# Patient Record
Sex: Male | Born: 1990 | Race: Black or African American | Hispanic: No | Marital: Single | Smoking: Former smoker
Health system: Southern US, Community
[De-identification: ages and names within clinical notes are randomized; demographics above are authoritative.]

## PROBLEM LIST (undated history)

## (undated) ENCOUNTER — Ambulatory Visit: Payer: Medicaid Other | Source: Home / Self Care

## (undated) DIAGNOSIS — E119 Type 2 diabetes mellitus without complications: Secondary | ICD-10-CM

## (undated) DIAGNOSIS — D649 Anemia, unspecified: Secondary | ICD-10-CM

## (undated) DIAGNOSIS — I10 Essential (primary) hypertension: Secondary | ICD-10-CM

## (undated) HISTORY — DX: Anemia, unspecified: D64.9

---
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
## 2012-02-23 ENCOUNTER — Emergency Department (HOSPITAL_COMMUNITY)
Admission: EM | Admit: 2012-02-23 | Discharge: 2012-02-23 | Disposition: A | Discharge status: Home / Self Care | Payer: Self-pay | Attending: Emergency Medicine | Admitting: Emergency Medicine

## 2012-02-23 ENCOUNTER — Emergency Department (HOSPITAL_COMMUNITY): Payer: Self-pay

## 2012-02-23 ENCOUNTER — Encounter (HOSPITAL_COMMUNITY): Payer: Self-pay | Admitting: Emergency Medicine

## 2012-02-23 DIAGNOSIS — R29898 Other symptoms and signs involving the musculoskeletal system: Secondary | ICD-10-CM

## 2012-02-23 DIAGNOSIS — M2550 Pain in unspecified joint: Secondary | ICD-10-CM | POA: Insufficient documentation

## 2012-02-23 DIAGNOSIS — S6390XA Sprain of unspecified part of unspecified wrist and hand, initial encounter: Secondary | ICD-10-CM | POA: Insufficient documentation

## 2012-02-23 DIAGNOSIS — F17200 Nicotine dependence, unspecified, uncomplicated: Secondary | ICD-10-CM | POA: Insufficient documentation
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
## 2012-02-24 NOTE — ED Provider Notes (Signed)
Medical screening examination/treatment/procedure(s) were performed by non-physician practitioner and as supervising physician I was immediately available for consultation/collaboration.   Glynn Octave, MD 02/24/12 [phone]

## 2012-06-23 ENCOUNTER — Ambulatory Visit (INDEPENDENT_AMBULATORY_CARE_PROVIDER_SITE_OTHER): Payer: BC Managed Care – PPO | Admitting: Family Medicine

## 2012-06-23 ENCOUNTER — Ambulatory Visit: Payer: BC Managed Care – PPO

## 2012-06-23 VITALS — BP 99/62 | HR 96 | Temp 97.9°F | Resp 18 | Ht 69.0 in | Wt 265.0 lb

## 2012-06-23 DIAGNOSIS — M25529 Pain in unspecified elbow: Secondary | ICD-10-CM

## 2012-06-23 DIAGNOSIS — M25522 Pain in left elbow: Secondary | ICD-10-CM
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
## 2012-06-23 NOTE — Patient Instructions (Signed)
Bone Bruise  A bone bruise is a small hidden fracture of the bone. It typically occurs with bones located close to the surface of the skin.  SYMPTOMS  The pain lasts longer than a normal bruise.  The bruised area is difficult to use.  There may be discoloration or swelling of the bruised area.  When a bone bruise is found with injury to the anterior cruciate ligament (in the knee) there is often an increased:  Amount of fluid in the knee  Time the fluid in the knee lasts.  Number of days until you are walking normally and regaining the motion you had before the injury.  Number of days with pain from the injury. DIAGNOSIS  It can only be seen on X-rays known as MRIs. This stands for magnetic resonance imaging. A regular X-ray taken of a bone bruise would appear to be normal. A bone bruise is a common injury in the knee and the heel bone (calcaneus). The problems are similar to those produced by stress fractures, which are bone injuries caused by overuse. A bone bruise may also be a sign of other injuries. For example, bone bruises are commonly found where an anterior cruciate ligament (ACL) in the knee has been pulled away from the bone (ruptured). A ligament is a tough fibrous material that connects bones together to make our joints stable. Bruises of the bone last a lot longer than bruises of the muscle or tissues beneath the skin. Bone bruises can last from days to months and are often more severe and painful than other bruises. TREATMENT Because bone bruises are sudden injuries you cannot often prevent them, other than by being extremely careful. Some things you can do to improve the condition are:  Apply ice to the sore area for 15 to 20 minutes, 3 to 4 times per day while awake for the first 2 days. Put the ice in a plastic bag, and place a towel between the bag of ice and your skin.  Keep your bruised area raised (elevated) when possible to lessen swelling.  For activity:  Use  crutches when necessary; do not put weight on the injured leg until you are no longer tender.  You may walk on your affected part as the pain allows, or as instructed.  Start weight bearing gradually on the bruised part.  Continue to use crutches or a cane until you can stand without causing pain, or as instructed.  If a plaster splint was applied, wear the splint until you are seen for a follow-up examination. Rest it on nothing harder than a pillow the first 24 hours. Do not put weight on it. Do not get it wet. You may take it off to take a shower or bath.  If an air splint was applied, more air may be blown into or out of the splint as needed for comfort. You may take it off at night and to take a shower or bath.  Wiggle your toes in the splint several times per day if you are able.  You may have been given an elastic bandage to use with the plaster splint or alone. The splint is too tight if you have numbness, tingling or if your foot becomes cold and blue. Adjust the bandage to make it comfortable.  Only take over-the-counter or prescription medicines for pain, discomfort, or fever as directed by your caregiver.  Follow all instructions for follow up with your caregiver. This includes any orthopedic referrals, physical therapy, and  for pain, discomfort, or fever as directed by your caregiver.   Follow all instructions for follow up with your caregiver. This includes any orthopedic referrals, physical therapy, and rehabilitation. Any delay in obtaining necessary care could result in a delay or failure of the bones to heal.  SEEK MEDICAL CARE IF:   You have an increase in bruising, swelling, or pain.   You notice coldness of your toes.   You do not get pain relief with medications.  SEEK IMMEDIATE MEDICAL CARE IF:   Your toes are numb or blue.   You have severe pain not controlled with medications.   If any of the problems that caused you to seek care are becoming worse.  Document Released: 05/10/2003 Document Revised: 05/12/2011 Document Reviewed: 09/22/2007   [employer] Patient Information 2013 [employer].

## 2012-06-24 ENCOUNTER — Encounter: Payer: Self-pay | Admitting: Family Medicine

## 2013-11-10 ENCOUNTER — Emergency Department (HOSPITAL_COMMUNITY)
Admission: EM | Admit: 2013-11-10 | Discharge: 2013-11-10 | Disposition: A | Discharge status: Home / Self Care | Payer: No Typology Code available for payment source | Attending: Emergency Medicine | Admitting: Emergency Medicine

## 2013-11-10 ENCOUNTER — Encounter (HOSPITAL_COMMUNITY): Payer: Self-pay | Admitting: Emergency Medicine

## 2013-11-10 DIAGNOSIS — Y9389 Activity, other specified: Secondary | ICD-10-CM | POA: Diagnosis not present

## 2013-11-10 DIAGNOSIS — Y92410 Unspecified street and highway as the place of occurrence of the external cause: Secondary | ICD-10-CM | POA: Insufficient documentation

## 2013-11-10 DIAGNOSIS — Z792 Long term (current) use of antibiotics: Secondary | ICD-10-CM | POA: Insufficient documentation

## 2013-11-10 DIAGNOSIS — F17200 Nicotine dependence, unspecified, uncomplicated: Secondary | ICD-10-CM | POA: Insufficient documentation

## 2013-11-10 DIAGNOSIS — IMO0002 Reserved for concepts with insufficient information to code with codable children: Secondary | ICD-10-CM | POA: Insufficient documentation

## 2013-11-10 DIAGNOSIS — Z872 Personal history of diseases of the skin and subcutaneous tissue: Secondary | ICD-10-CM | POA: Diagnosis not present

## 2013-11-10 MED ORDER — OXYCODONE-ACETAMINOPHEN 5-325 MG PO TABS
1.0000 | ORAL_TABLET | Freq: Once | ORAL | Status: AC
  Administered 2013-11-10: 1 via ORAL
  Filled 2013-11-10: qty 1

## 2013-11-10 MED ORDER — IBUPROFEN 400 MG PO TABS
600.0000 mg | ORAL_TABLET | Freq: Once | ORAL | Status: AC
  Administered 2013-11-10: 600 mg via ORAL
  Filled 2013-11-10 (×2): qty 1

## 2013-11-10 NOTE — ED Notes (Addendum)
Pt reports midline back pain and tenderness; reports thinks he is diabetic and has boils under arms. C collar in place; pt log rolled and back board removed. PT resting without distress.
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
## 2013-11-10 NOTE — ED Notes (Signed)
MD at bedside. 

## 2013-11-10 NOTE — ED Notes (Signed)
PT ambulated with baseline gait; VSS; A&Ox3; no signs of distress; respirations even and unlabored; skin warm and dry; no questions upon discharge.  

## 2013-11-10 NOTE — Discharge Instructions (Signed)
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
## 2013-12-27 IMAGING — CR DG HUMERUS 2V *L*
2 series · 2 of 2 positions shown · non-contrast
Comparison: None.

CLINICAL DATA: 21-year-old male with pain.

LEFT HUMERUS - 2+ VIEW

[lateral (1 of 2)]
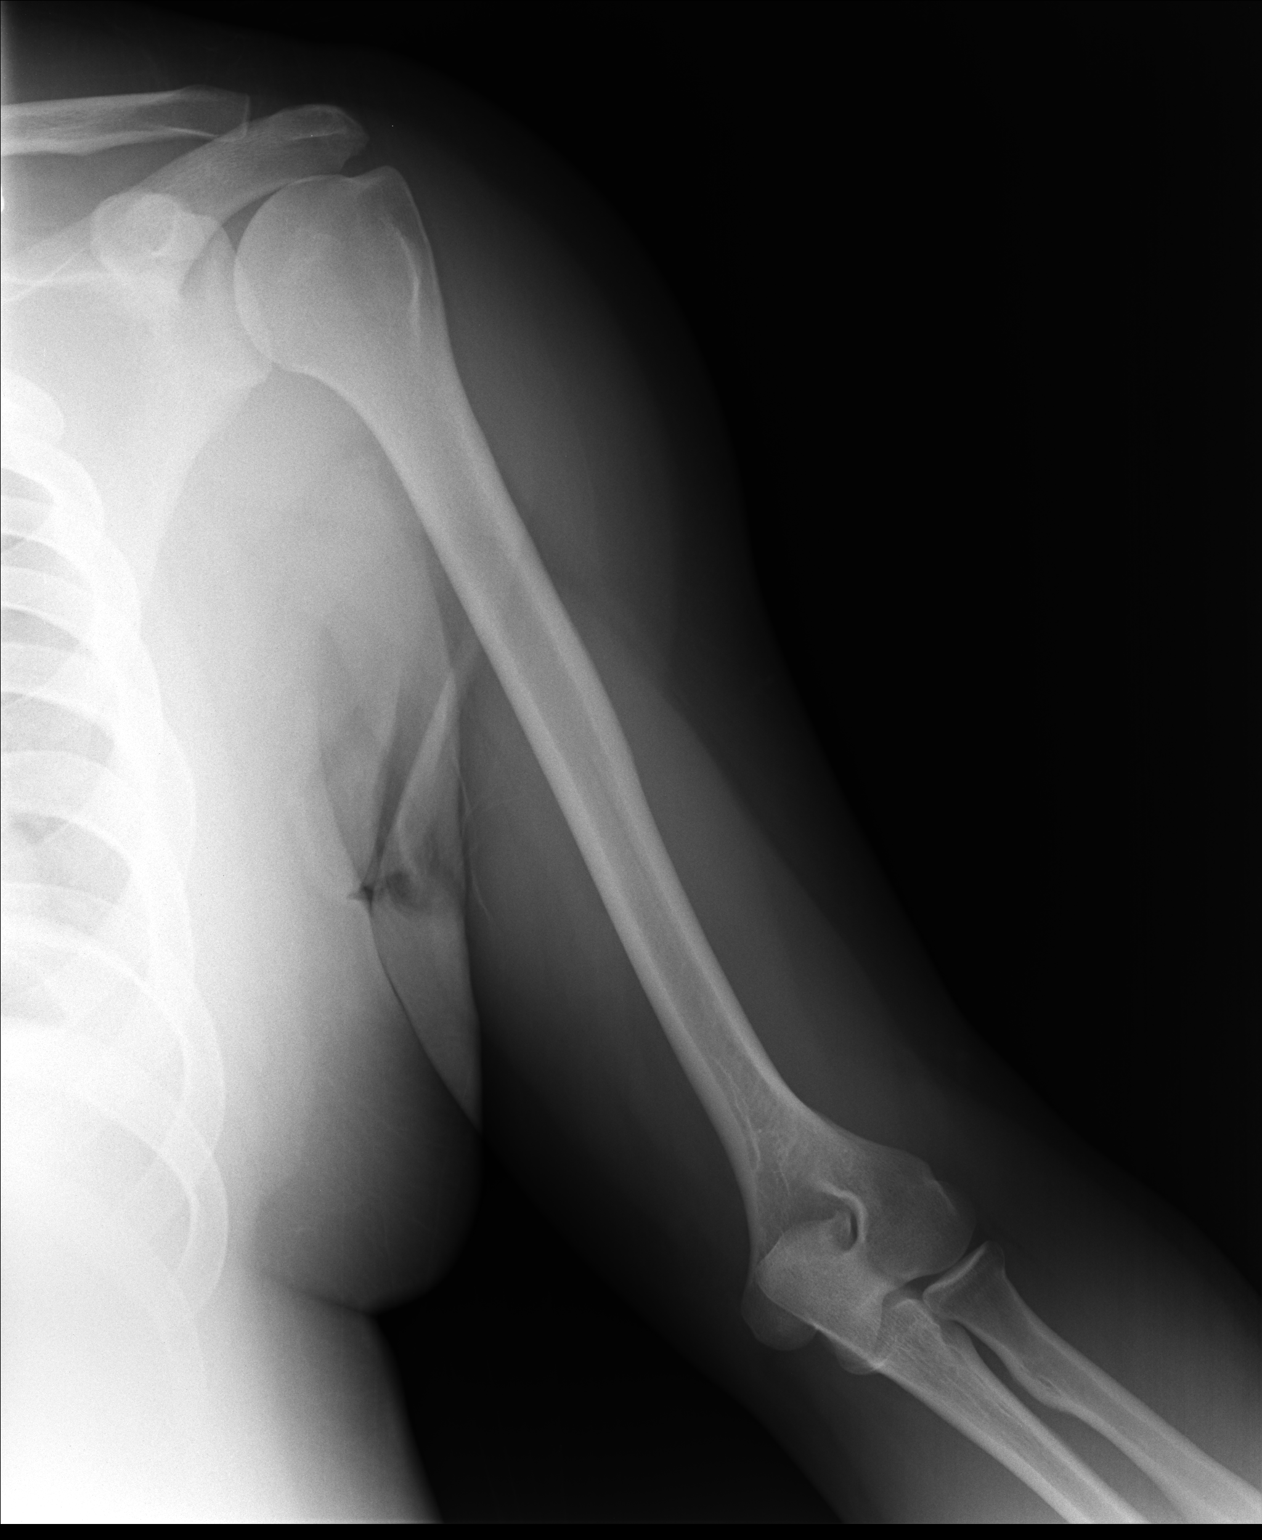

[lateral (2 of 2)]
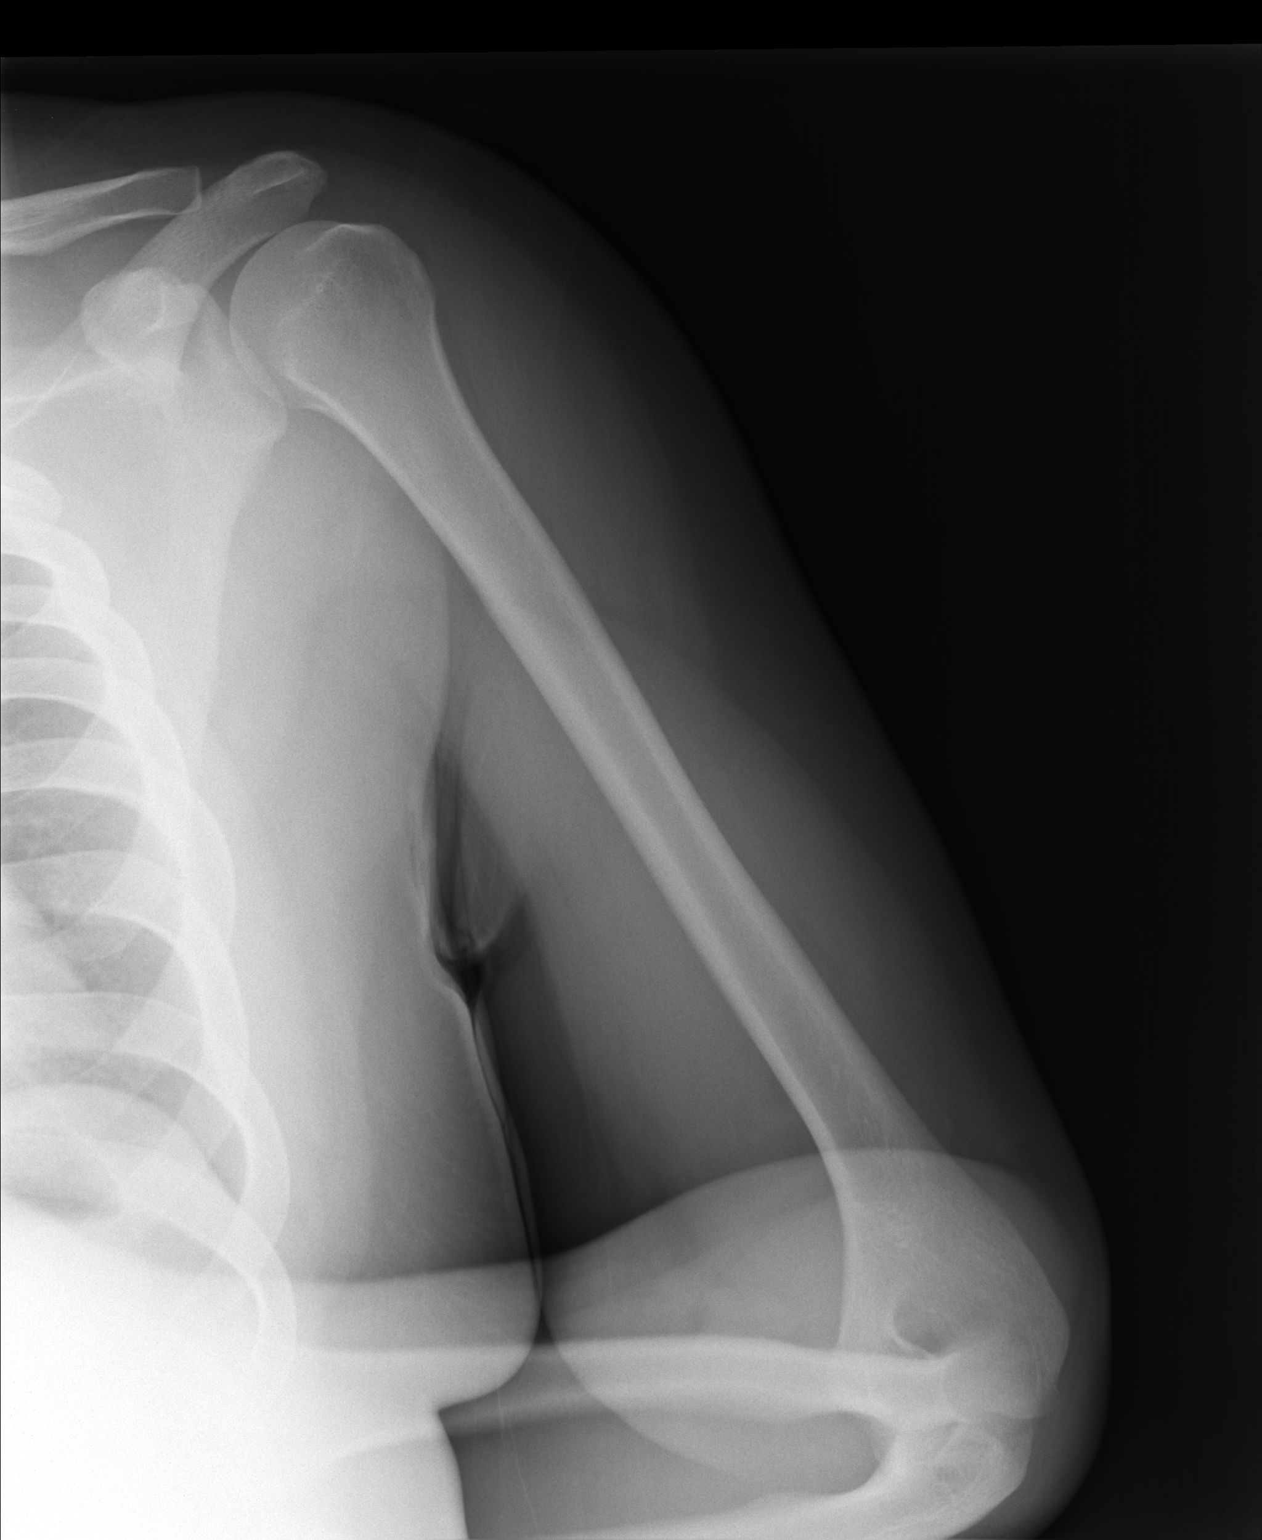

[2 of 2 positions shown; findings below may reference images not displayed]

FINDINGS: No markers on the images.  This appears to be the left
upper extremity based on the visible cardiac contour. Bone
mineralization is within normal limits.  Grossly normal alignment
at the elbow and shoulder.  Humerus intact. Visible ribs appear
intact.
IMPRESSION: No acute fracture or dislocation identified about the humerus.

## 2014-09-26 ENCOUNTER — Emergency Department (HOSPITAL_COMMUNITY)
Admission: EM | Admit: 2014-09-26 | Discharge: 2014-09-26 | Disposition: A | Discharge status: Home / Self Care | Payer: Self-pay | Attending: Emergency Medicine | Admitting: Emergency Medicine

## 2014-09-26 ENCOUNTER — Encounter (HOSPITAL_COMMUNITY): Payer: Self-pay | Admitting: Emergency Medicine

## 2014-09-26 DIAGNOSIS — Z720 Tobacco use: Secondary | ICD-10-CM | POA: Insufficient documentation

## 2014-09-26 DIAGNOSIS — B86 Scabies: Secondary | ICD-10-CM | POA: Insufficient documentation

## 2014-09-26 MED ORDER — PERMETHRIN 5 % EX CREA: TOPICAL_CREAM | CUTANEOUS | Status: DC

## 2014-09-26 NOTE — ED Notes (Signed)
Pt is c/o rash that started yesterday  Pt has rash noted on his hands, arms, feet, and ankles  Pt states he thinks it may be flea bites  Pt states before he got the rash he had soreness in his hands  Pt states he was sleeping on a bed where a dog had been sleeping

## 2014-09-26 NOTE — ED Provider Notes (Signed)
CSN: [id number]     Arrival date & time 09/26/14  0149 History   This chart was scribed for Loren Racer, MD by Arlan Organ, ED Scribe. This patient was seen in room WA15/WA15 and the patient's care was started 2:32 AM.   Chief Complaint  Patient presents with  . Rash   The history is provided by the patient. No language interpreter was used.    HPI Comments: Willie Spears is a 24 y.o. male with a PMHx of scabies who presents to the Emergency Department complaining of a ongoing, unchaged  rash to the hands, feet, and ankles bilaterally x 2 days. Pt feels he may have been bitten by insects but denies visualizing any insects at time of onset. Pt reports sleeping in the same bed as a dog a few days prior. No OTC oral or topical medications attempted prior to arrival. No recent fever, chills, shortness of breath, nausea, vomiting, or diarrhea. No known allergies to medications.  History reviewed. No pertinent past medical history. History reviewed. No pertinent past surgical history. History reviewed. No pertinent family history. History  Substance Use Topics  . Smoking status: Current Every Day Smoker    Types: Cigarettes  . Smokeless tobacco: Not on file  . Alcohol Use: No    Review of Systems  Constitutional: Negative for fever and chills.  HENT: Negative for congestion and sore throat.   Respiratory: Negative for cough and shortness of breath.   Cardiovascular: Negative for chest pain.  Gastrointestinal: Negative for nausea, vomiting and abdominal pain.  Skin: Positive for rash.  Psychiatric/Behavioral: Negative for confusion.  All other systems reviewed and are negative.     Allergies  Review of patient's allergies indicates no known allergies.  Home Medications   Prior to Admission medications   Medication Sig Start Date End Date Taking? Authorizing Provider  guaiFENesin (MUCINEX) 600 MG 12 hr tablet Take 600 mg by mouth 2 (two) times daily as needed for cough.   Yes  Historical Provider, MD  Magnesium Sulfate, Laxative, (EPSOM SALT PO) Take by mouth. Soaked hands in solution   Yes Historical Provider, MD  permethrin (ELIMITE) 5 % cream Apply to affected area once 09/26/14   Loren Racer, MD   Triage Vitals: BP 140/104 mmHg  Pulse 100  Temp(Src) 98.6 F (37 C) (Oral)  Resp 20  SpO2 98%   Physical Exam  Constitutional: He is oriented to person, place, and time. He appears well-developed and well-nourished. No distress.  HENT:  Head: Normocephalic and atraumatic.  Mouth/Throat: Oropharynx is clear and moist. No oropharyngeal exudate.  No intraoral lesions  Eyes: EOM are normal. Pupils are equal, round, and reactive to light.  Neck: Normal range of motion. Neck supple.  Cardiovascular: Normal rate and regular rhythm.   Pulmonary/Chest: Effort normal and breath sounds normal. He has no wheezes. He has no rales.  Abdominal: Soft. Bowel sounds are normal.  Musculoskeletal: Normal range of motion. He exhibits no edema or tenderness.  Neurological: He is alert and oriented to person, place, and time.  Skin: Skin is warm and dry. Rash noted. No erythema.  Patient with papular macular rash that is erythematous mostly concentrated in the interdigital spaces of the bilateral hands and extending up the forearms. Patient also had several lesions to the face and bilateral feet. No truncal lesions noted.  Psychiatric: He has a normal mood and affect. His behavior is normal.  Nursing note and vitals reviewed.   ED Course  Procedures (including  critical care time)  DIAGNOSTIC STUDIES: Oxygen Saturation is 98% on RA, Normal by my interpretation.    COORDINATION OF CARE: 2:37 AM- Will discharge home with Elimite cream. Discussed treatment plan with pt at bedside and pt agreed to plan.     Labs Review Labs Reviewed - No data to display  Imaging Review No results found.   EKG Interpretation None      MDM   Final diagnoses:  Scabies    I  personally performed the services described in this documentation, which was scribed in my presence. The recorded information has been reviewed and is accurate.   Exam is consistent with scabies. We'll treat with Elimite. Denies any constitutional symptoms.    Loren Racer, MD 09/26/14 [phone]

## 2014-09-26 NOTE — Discharge Instructions (Signed)

## 2014-09-26 NOTE — ED Notes (Signed)
Patient has a skin rash all over body that itches.

## 2015-03-28 ENCOUNTER — Encounter: Payer: Self-pay | Admitting: Family Medicine

## 2015-03-28 ENCOUNTER — Ambulatory Visit (INDEPENDENT_AMBULATORY_CARE_PROVIDER_SITE_OTHER): Payer: BLUE CROSS/BLUE SHIELD | Admitting: Family Medicine

## 2015-03-28 VITALS — BP 140/81 | HR 81 | Ht 69.0 in | Wt 266.0 lb

## 2015-03-28 DIAGNOSIS — R5383 Other fatigue: Secondary | ICD-10-CM | POA: Diagnosis not present

## 2015-03-28 DIAGNOSIS — Z833 Family history of diabetes mellitus: Secondary | ICD-10-CM | POA: Insufficient documentation

## 2015-03-28 DIAGNOSIS — R739 Hyperglycemia, unspecified: Secondary | ICD-10-CM

## 2015-03-28 NOTE — Progress Notes (Signed)
CC: Willie Spears is a 25 y.o. male is here for Establish Care   Subjective: HPI:  Very pleasant 25 year old here to establish care relative Willie Spears  Patient states that he is concerned he might have type 2 diabetes. Ever since middle school his notes his blood sugar was above 100 when he checks it randomly with his parents glucometer. A couple weeks ago he saw a value of 320. He notices that the blood sugars worse if he drinks fruit punch or Gatorade. He is not following any particular diet. He reports fatigue on a daily basis to the point where he feels like he is not actually getting sleep and only wants to do is go back to bed. He denies any weakness or shortness of breath. Denies any depression. Symptoms of fatigue are moderate in severity and have been unchanged for matter of months.  Review of Systems - General ROS: negative for - chills, fever, night sweats, weight gain or weight loss Ophthalmic ROS: negative for - decreased vision Psychological ROS: negative for - anxiety or depression ENT ROS: negative for - hearing change, nasal congestion, tinnitus or allergies Hematological and Lymphatic ROS: negative for - bleeding problems, bruising or swollen lymph nodes Breast ROS: negative Respiratory ROS: no cough, shortness of breath, or wheezing Cardiovascular ROS: no chest pain or dyspnea on exertion Gastrointestinal ROS: no abdominal pain, change in bowel habits, or black or bloody stools Genito-Urinary ROS: negative for - genital discharge, genital ulcers, incontinence or abnormal bleeding from genitals Musculoskeletal ROS: negative for - joint pain or muscle pain Neurological ROS: negative for - headaches or memory loss Dermatological ROS: negative for lumps, mole changes, rash and skin lesion changes History reviewed. No pertinent past medical history.  History reviewed. No pertinent past surgical history. Family History  Problem Relation Age of Onset  . Diabetes Mother   .  Diabetes Father     Social History   Social History  . Marital Status: Single    Spouse Name: N/A  . Number of Children: N/A  . Years of Education: N/A   Occupational History  . Not on file.   Social History Main Topics  . Smoking status: Never Smoker   . Smokeless tobacco: Never Used  . Alcohol Use: No  . Drug Use: Yes    Special: Marijuana  . Sexual Activity: Yes   Other Topics Concern  . Not on file   Social History Narrative     Objective: BP 140/81 mmHg  Pulse 81  Ht  (1.753 m)  Wt 266 lb (120.657 kg)  BMI 39.26 kg/m2  General: Alert and Oriented, No Acute Distress HEENT: Pupils equal, round, reactive to light. Conjunctivae clear. Moist mucousmembranes Lungs: Clear to auscultation bilaterally, no wheezing/ronchi/rales.  Comfortable work of breathing. Good air movement. Cardiac: Regular rate and rhythm. Normal S1/S2.  No murmurs, rubs, nor gallops.   Abdomen: moderate obesity Extremities: No peripheral edema.  Strong peripheral pulses.  Mental Status: No depression, anxiety, nor agitation. Skin: Warm and dry.  Assessment & Plan: Willie Spears was seen today for establish care.  Diagnoses and all orders for this visit:  Other fatigue -     Vitamin D (25 hydroxy) -     Hemoglobin -     Hemoglobin A1c -     BASIC METABOLIC PANEL WITH GFR -     TSH  Hyperglycemia   Fatigue: Most likely due to hypoglycemia however ruling out anemia, vitamin D deficiency hypothyroidism and metabolic abnormality.  Hyperglycemia: High suspicion for type 2 diabetes therefore check an A1c and random blood sugar today. If A1c is elevated high enough to tolerate a SGL2 inhibitor anticipate using this with metformin to help with weight loss and blood sugar reduction.  Return if symptoms worsen or fail to improve.

## 2015-03-29 ENCOUNTER — Telehealth: Payer: Self-pay | Admitting: Family Medicine

## 2015-03-29 DIAGNOSIS — E559 Vitamin D deficiency, unspecified: Secondary | ICD-10-CM | POA: Insufficient documentation

## 2015-03-29 DIAGNOSIS — E119 Type 2 diabetes mellitus without complications: Secondary | ICD-10-CM | POA: Insufficient documentation

## 2015-03-29 LAB — BASIC METABOLIC PANEL WITH GFR
BUN: 14 mg/dL (ref 7–25)
CALCIUM: 9.4 mg/dL (ref 8.6–10.3)
CO2: 25 mmol/L (ref 20–31)
Chloride: 102 mmol/L (ref 98–110)
Creat: 1.1 mg/dL (ref 0.60–1.35)
Glucose, Bld: 286 mg/dL — ABNORMAL HIGH (ref 65–99)
POTASSIUM: 4.2 mmol/L (ref 3.5–5.3)
SODIUM: 137 mmol/L (ref 135–146)

## 2015-03-29 LAB — VITAMIN D 25 HYDROXY (VIT D DEFICIENCY, FRACTURES): Vit D, 25-Hydroxy: 11 ng/mL — ABNORMAL LOW (ref 30–100)

## 2015-03-29 LAB — HEMOGLOBIN A1C
HEMOGLOBIN A1C: 12.7 % — AB (ref <5.7)
Mean Plasma Glucose: 318 mg/dL — ABNORMAL HIGH (ref <117)

## 2015-03-29 LAB — TSH: TSH: 0.942 u[IU]/mL (ref 0.350–4.500)

## 2015-03-29 LAB — HEMOGLOBIN: HEMOGLOBIN: 15.4 g/dL (ref 13.0–17.0)

## 2015-03-29 MED ORDER — DAPAGLIFLOZIN PRO-METFORMIN ER 5-1000 MG PO TB24: 1.0000 | ORAL_TABLET | Freq: Two times a day (BID) | ORAL | Status: DC

## 2015-03-29 MED ORDER — VITAMIN D (ERGOCALCIFEROL) 1.25 MG (50000 UNIT) PO CAPS: 50000.0000 [IU] | ORAL_CAPSULE | ORAL | Status: DC

## 2015-03-29 NOTE — Telephone Encounter (Signed)
Pt mother advised.

## 2015-03-29 NOTE — Telephone Encounter (Signed)
Will you please let patient know that his three month average blood sugar was in the type 2 diabetes range. To help address this I've sent a Rx called Xigduo to his [facility] pharmacy on [address].  This will help with blood sugar and weight loss.  Also his vitamin D level was in the deficient range which is also contributing to his fatigue.  I'll send a weekly supplement of Vitamin D to the same pharmacy.  Please f/u in 3 months.

## 2015-03-30 ENCOUNTER — Other Ambulatory Visit: Payer: Self-pay

## 2015-03-30 DIAGNOSIS — E118 Type 2 diabetes mellitus with unspecified complications: Secondary | ICD-10-CM

## 2015-03-30 MED ORDER — DAPAGLIFLOZIN PRO-METFORMIN ER 5-1000 MG PO TB24
1.0000 | ORAL_TABLET | Freq: Two times a day (BID) | ORAL | Status: DC
Start: 2015-03-30 — End: 2015-04-09

## 2015-04-04 ENCOUNTER — Telehealth: Payer: Self-pay

## 2015-04-04 NOTE — Telephone Encounter (Signed)
Davonna Belling is too costly.  Is there something else he can take in the place of it?

## 2015-04-05 NOTE — Telephone Encounter (Signed)
Spoke with mom and the pharmacy did not process their insurance.  Once she gets a chance to go back to the pharmacy today she will let me know what the cost of medication with their insurance before coming to get a discount card.

## 2015-04-05 NOTE — Telephone Encounter (Signed)
Can you please confirm that he's using one of the activated savings cards first.  If so then the medication should be free or just a few dollars.  If he's using the card and it's still expensive then the pharmacist is not processing the card correctly, can you then please find out which pharmacy he's using so I can send the xigduo representative there.

## 2015-04-06 NOTE — Telephone Encounter (Signed)
Received information back from insurance company, Rx was denied until Pt tries and fails their preferred formulary. Will put information in ordering providers box for review.  

## 2015-04-09 MED ORDER — CANAGLIFLOZIN-METFORMIN HCL 150-1000 MG PO TABS: ORAL_TABLET | ORAL | Status: DC

## 2015-04-09 NOTE — Addendum Note (Signed)
Addended by: Laren Boom on: 04/09/2015 08:30 AM   Modules accepted: Orders, Medications

## 2015-04-09 NOTE — Telephone Encounter (Signed)
Mother notified

## 2015-04-09 NOTE — Telephone Encounter (Signed)
Willie Spears, Will you please let patient to forget about Xigduo XR, his insurance prefers Invokamet that I've sent to [facility] on [address]. Please f/u in 3 months.

## 2015-04-10 ENCOUNTER — Other Ambulatory Visit: Payer: Self-pay

## 2015-04-10 MED ORDER — LANCETS 30G MISC: Status: DC

## 2015-04-10 MED ORDER — BAYER CONTOUR NEXT MONITOR W/DEVICE KIT: PACK | Status: DC

## 2015-04-10 MED ORDER — GLUCOSE BLOOD VI STRP: ORAL_STRIP | Status: DC

## 2015-05-20 ENCOUNTER — Other Ambulatory Visit: Payer: Self-pay | Admitting: Family Medicine

## 2015-10-31 ENCOUNTER — Ambulatory Visit: Payer: BLUE CROSS/BLUE SHIELD | Admitting: Family Medicine

## 2015-11-08 ENCOUNTER — Ambulatory Visit: Payer: BLUE CROSS/BLUE SHIELD | Admitting: Family Medicine

## 2015-12-12 ENCOUNTER — Ambulatory Visit: Payer: BLUE CROSS/BLUE SHIELD | Admitting: Family Medicine

## 2017-09-01 ENCOUNTER — Encounter (HOSPITAL_COMMUNITY): Payer: Self-pay | Admitting: Emergency Medicine

## 2017-09-01 ENCOUNTER — Emergency Department (HOSPITAL_COMMUNITY)
Admission: EM | Admit: 2017-09-01 | Discharge: 2017-09-01 | Disposition: A | Discharge status: Home / Self Care | Payer: BLUE CROSS/BLUE SHIELD | Attending: Emergency Medicine | Admitting: Emergency Medicine

## 2017-09-01 ENCOUNTER — Other Ambulatory Visit: Payer: Self-pay

## 2017-09-01 DIAGNOSIS — Z79899 Other long term (current) drug therapy: Secondary | ICD-10-CM | POA: Insufficient documentation

## 2017-09-01 DIAGNOSIS — Y998 Other external cause status: Secondary | ICD-10-CM | POA: Insufficient documentation

## 2017-09-01 DIAGNOSIS — S39012A Strain of muscle, fascia and tendon of lower back, initial encounter: Secondary | ICD-10-CM | POA: Insufficient documentation

## 2017-09-01 DIAGNOSIS — Y9389 Activity, other specified: Secondary | ICD-10-CM | POA: Insufficient documentation

## 2017-09-01 DIAGNOSIS — X500XXA Overexertion from strenuous movement or load, initial encounter: Secondary | ICD-10-CM | POA: Insufficient documentation

## 2017-09-01 DIAGNOSIS — Z7984 Long term (current) use of oral hypoglycemic drugs: Secondary | ICD-10-CM | POA: Insufficient documentation

## 2017-09-01 DIAGNOSIS — Y929 Unspecified place or not applicable: Secondary | ICD-10-CM | POA: Insufficient documentation

## 2017-09-01 DIAGNOSIS — E119 Type 2 diabetes mellitus without complications: Secondary | ICD-10-CM | POA: Insufficient documentation

## 2017-09-01 MED ORDER — NAPROXEN 500 MG PO TABS: 500.0000 mg | ORAL_TABLET | Freq: Two times a day (BID) | ORAL | 0 refills | Status: DC

## 2017-09-01 NOTE — ED Notes (Signed)
PA at bedside.

## 2017-09-01 NOTE — ED Triage Notes (Signed)
Patient here from home with complaints of lower back pain and bilateral leg pain. Reports that he is a [employer] and was bending all day.

## 2017-09-01 NOTE — ED Provider Notes (Signed)
[facility] DEPT Provider Note   CSN: [id number] Arrival date & time: 09/01/17  1716     History   Chief Complaint Chief Complaint  Patient presents with  . Back Pain  . Leg Pain    HPI Willie Spears is a 27 y.o. male presenting for evaluation of back pain.  Patient states he was picking up a heavy motor 2 days ago.  Since then, he has had pain of his low right back and bilateral hamstrings.  The pain does not connect.  Pain is present when going from sitting to standing, and when bending over.  He describes it as a soreness, thinks it is a muscle strain.  He has taken a few Tylenol without significant improvement of his symptoms.  He has not tried anything else.  He denies numbness or tingling.  He denies fevers, chills, rash, fall, trauma, injury.  He denies history of cancer or IV drug use.  He denies loss of bowel or bladder control, or any urinary symptoms.  He has a history of diabetes for which he takes medication, no other medical problems.  HPI  History reviewed. No pertinent past medical history.  Patient Active Problem List   Diagnosis Date Noted  . Type 2 diabetes mellitus ([place]) 03/29/2015  . Vitamin D deficiency 03/29/2015  . Fatigue 03/28/2015  . Family history of diabetes mellitus 03/28/2015    History reviewed. No pertinent surgical history.      Home Medications    Prior to Admission medications   Medication Sig Start Date End Date Taking? Authorizing Provider  BAYER CONTOUR NEXT TEST test strip CHECK FASTING BLOOD SUGAR ONCE DAILY. DIAGNOSIS TYPE 2 DIABETES ICD 10 E11.8 05/22/15   Marcial Pacas, DO  Blood Glucose Monitoring Suppl (BAYER CONTOUR NEXT MONITOR) w/Device KIT Check fasting blood sugar once daily. Diagnosis Type 2 Diabetes ICD 10 E11.8 04/10/15   Hommel, Hilliard Clark, DO  Canagliflozin-Metformin HCl (INVOKAMET) [phone] MG TABS One by mouth twice a day. 04/09/15   Marcial Pacas, DO  Lancets 30G MISC Check fasting blood sugar once  daily. Diagnosis Type 2 Diabetes ICD 10 E11.8 04/10/15   Hommel, [name], DO  naproxen (NAPROSYN) 500 MG tablet Take 1 tablet (500 mg total) by mouth 2 (two) times daily with a meal. 09/01/17   Daytona Hedman, PA-C  Vitamin D, Ergocalciferol, (DRISDOL) 50000 units CAPS capsule Take 1 capsule (50,000 Units total) by mouth every 7 (seven) days. Recheck Vitamin D in 3 Months 03/29/15   Marcial Pacas, DO    Family History Family History  Problem Relation Age of Onset  . Diabetes Mother   . Diabetes Father     Social History Social History   Tobacco Use  . Smoking status: Never Smoker  . Smokeless tobacco: Never Used  Substance Use Topics  . Alcohol use: No  . Drug use: Yes    Types: Marijuana     Allergies   Patient has no known allergies.   Review of Systems Review of Systems  Musculoskeletal: Positive for back pain and myalgias.  Neurological: Negative for numbness.  Hematological: Does not bruise/bleed easily.     Physical Exam Updated Vital Signs BP (!) 156/92 (BP Location: Right Arm)   Pulse (!) 104   Temp 98.4 F (36.9 C) (Oral)   Resp 14   Ht 5' 11"  (1.803 m)   Wt 117.9 kg (260 lb)   SpO2 100%   BMI 36.26 kg/m   Physical Exam  Constitutional: He  is oriented to person, place, and time. He appears well-developed and well-nourished. No distress.  Appears in no distress  HENT:  Head: Normocephalic and atraumatic.  Eyes: EOM are normal.  Neck: Normal range of motion.  Cardiovascular: Regular rhythm and intact distal pulses.  Mildly tachycardic around 100  Pulmonary/Chest: Effort normal and breath sounds normal. No respiratory distress. He has no wheezes.  Abdominal: Soft. He exhibits no distension. There is no tenderness.  Musculoskeletal: Normal range of motion. He exhibits tenderness.  Tenderness to palpation of right low back musculature without increased pain over midline spine.  No step-offs or deformities.  Strength intact x4.  Sensation intact x4.  Radial  and pedal pulses intact bilaterally.  Increased pain of bilateral hamstrings with flexion of the knee.  No pain with straight leg raise.  Patient is ambulatory.  Neurological: He is alert and oriented to person, place, and time. No sensory deficit.  Skin: Skin is warm. Capillary refill takes less than 2 seconds. No rash noted.  Psychiatric: He has a normal mood and affect.  Nursing note and vitals reviewed.    ED Treatments / Results  Labs (all labs ordered are listed, but only abnormal results are displayed) Labs Reviewed - No data to display  EKG None  Radiology No results found.  Procedures Procedures (including critical care time)  Medications Ordered in ED Medications - No data to display   Initial Impression / Assessment and Plan / ED Course  I have reviewed the triage vital signs and the nursing notes.  Pertinent labs & imaging results that were available during my care of the patient were reviewed by me and considered in my medical decision making (see chart for details).     Patient presenting for evaluation of low back pain and bilateral hamstring pain.  Physical exam reassuring, pain is reproducible with palpation of the musculature.  Likely muscle strain.  No red flags for back pain.  Discussed with patient, and patient agrees.  At this time, doubt vertebral fracture, infection, spinal cord compression, myelopathy, or cauda equina syndrome.  Will treat with NSAIDs and muscle creams.  Discussed heat and stretching.  Discussed follow-up with PCP as needed.  At this time, patient appears safe for discharge.  Return precautions given.  Patient states he understands and agrees to plan.   Final Clinical Impressions(s) / ED Diagnoses   Final diagnoses:  Strain of lumbar region, initial encounter    ED Discharge Orders        Ordered    naproxen (NAPROSYN) 500 MG tablet  2 times daily with meals     09/01/17 1848       Franchot Heidelberg, PA-C 09/01/17 2157      Davonna Belling, MD 09/02/17 [phone]

## 2017-09-01 NOTE — Discharge Instructions (Addendum)
Take naproxen 2 times a day with meals.  Do not take other anti-inflammatories at the same time open (Advil, Motrin, ibuprofen, Aleve). You may supplement with Tylenol if you need further pain control. Use muscle creams (bengay, icy hot, salonpas) as needed for pain.  Use heat to help relax your muscles.  Try the stretches to help with pain. Follow up with your primary care doctor if pain is not improving with this treatment in 2 weeks.  Return to the ER if you develop high fevers, numbness, loss of bowel or bladder control, or any new or concerning symptoms.

## 2017-09-30 ENCOUNTER — Encounter (HOSPITAL_COMMUNITY): Payer: Self-pay | Admitting: Obstetrics and Gynecology

## 2017-09-30 ENCOUNTER — Emergency Department (HOSPITAL_COMMUNITY)
Admission: EM | Admit: 2017-09-30 | Discharge: 2017-09-30 | Disposition: A | Discharge status: Home / Self Care | Payer: BLUE CROSS/BLUE SHIELD | Attending: Emergency Medicine | Admitting: Emergency Medicine

## 2017-09-30 ENCOUNTER — Other Ambulatory Visit: Payer: Self-pay

## 2017-09-30 DIAGNOSIS — Z79899 Other long term (current) drug therapy: Secondary | ICD-10-CM | POA: Insufficient documentation

## 2017-09-30 DIAGNOSIS — E119 Type 2 diabetes mellitus without complications: Secondary | ICD-10-CM | POA: Insufficient documentation

## 2017-09-30 DIAGNOSIS — L239 Allergic contact dermatitis, unspecified cause: Secondary | ICD-10-CM | POA: Insufficient documentation

## 2017-09-30 HISTORY — DX: Type 2 diabetes mellitus without complications: E11.9

## 2017-09-30 NOTE — Discharge Instructions (Addendum)
The rash on the penis head appears to be allergic dermatitis. This means that something that got on it caused irritation.It does not look like herpes. To treat the reddened areas, try using hydrocortisone cream, 0.5%, available at any pharmacy.  See your doctor as needed for problems.

## 2017-09-30 NOTE — ED Triage Notes (Signed)
Patient here with complaint of "red dots on the head of his penis." Pt reports he accidentally touched his genitalia with Drema PryBrake Dust on his hands.  Pt reports this has happened before but he doesn't remember what he did

## 2017-09-30 NOTE — ED Provider Notes (Signed)
[facility] DEPT Provider Note   CSN: [id number] Arrival date & time: 09/30/17  1601     History   Chief Complaint Chief Complaint  Patient presents with  . Rash    HPI Willie Spears is a 27 y.o. male.  HPI   He presents for evaluation of reddened area on the tip of his penis noticed today when he pulled the foreskin back.  He is worried that it might be herpes.  He also is concerned that he may have irritated it after touching his penis following working on a car.  He denies urethral discharge, testicular pain, abdominal pain, back pain, fever, chills, weakness or dizziness.  No prior similar problems.  There are no other known modifying factors.   Past Medical History:  Diagnosis Date  . Diabetes mellitus without complication [facility])     Patient Active Problem List   Diagnosis Date Noted  . Type 2 diabetes mellitus ([facility]) 03/29/2015  . Vitamin D deficiency 03/29/2015  . Fatigue 03/28/2015  . Family history of diabetes mellitus 03/28/2015    History reviewed. No pertinent surgical history.      Home Medications    Prior to Admission medications   Medication Sig Start Date End Date Taking? Authorizing Provider  BAYER CONTOUR NEXT TEST test strip CHECK FASTING BLOOD SUGAR ONCE DAILY. DIAGNOSIS TYPE 2 DIABETES ICD 10 E11.8 05/22/15   Marcial Pacas, DO  Blood Glucose Monitoring Suppl (BAYER CONTOUR NEXT MONITOR) w/Device KIT Check fasting blood sugar once daily. Diagnosis Type 2 Diabetes ICD 10 E11.8 04/10/15   Hommel, Hilliard Clark, DO  Canagliflozin-Metformin HCl (INVOKAMET) [phone] MG TABS One by mouth twice a day. 04/09/15   Marcial Pacas, DO  Lancets 30G MISC Check fasting blood sugar once daily. Diagnosis Type 2 Diabetes ICD 10 E11.8 04/10/15   Hommel, [name], DO  naproxen (NAPROSYN) 500 MG tablet Take 1 tablet (500 mg total) by mouth 2 (two) times daily with a meal. 09/01/17   Caccavale, Sophia, PA-C  Vitamin D, Ergocalciferol, (DRISDOL) 50000 units CAPS  capsule Take 1 capsule (50,000 Units total) by mouth every 7 (seven) days. Recheck Vitamin D in 3 Months 03/29/15   Marcial Pacas, DO    Family History Family History  Problem Relation Age of Onset  . Diabetes Mother   . Diabetes Father     Social History Social History   Tobacco Use  . Smoking status: Never Smoker  . Smokeless tobacco: Never Used  Substance Use Topics  . Alcohol use: No  . Drug use: Yes    Types: Marijuana     Allergies   Patient has no known allergies.   Review of Systems Review of Systems  All other systems reviewed and are negative.    Physical Exam Updated Vital Signs BP (!) 152/102 (BP Location: Left Arm)   Pulse 100   Temp 98.7 F (37.1 C) (Oral)   Resp 18   SpO2 97%   Physical Exam  Constitutional: He is oriented to person, place, and time. He appears well-developed and well-nourished. No distress.  HENT:  Head: Normocephalic and atraumatic.  Right Ear: External ear normal.  Left Ear: External ear normal.  Eyes: Pupils are equal, round, and reactive to light. Conjunctivae and EOM are normal.  Neck: Normal range of motion and phonation normal. Neck supple.  Cardiovascular: Normal rate.  Pulmonary/Chest: Effort normal. He exhibits no bony tenderness.  Genitourinary:  Genitourinary Comments: Normal scrotum and scrotum contents.  No urethral discharge.  Indistinct red rash on top of glands, characterized by 1 to 2 mm areas of redness which are flat, there is no associated vesicles, bleeding or drainage.  Foreskin is retractable and normal in appearance.  Musculoskeletal: Normal range of motion.  Neurological: He is alert and oriented to person, place, and time. No cranial nerve deficit or sensory deficit. He exhibits normal muscle tone. Coordination normal.  Skin: Skin is warm, dry and intact.  Psychiatric: He has a normal mood and affect. His behavior is normal. Judgment and thought content normal.  Nursing note and vitals  reviewed.    ED Treatments / Results  Labs (all labs ordered are listed, but only abnormal results are displayed) Labs Reviewed - No data to display  EKG None  Radiology No results found.  Procedures Procedures (including critical care time)  Medications Ordered in ED Medications - No data to display   Initial Impression / Assessment and Plan / ED Course  I have reviewed the triage vital signs and the nursing notes.  Pertinent labs & imaging results that were available during my care of the patient were reviewed by me and considered in my medical decision making (see chart for details).      Patient Vitals for the past 24 hrs:  BP Temp Temp src Pulse Resp SpO2  09/30/17 1606 (!) 152/102 98.7 F (37.1 C) Oral 100 18 97 %    4:40 PM Reevaluation with update and discussion. After initial assessment and treatment, an updated evaluation reveals no change in clinical status.  Findings discussed with the patient and all questions were answered. Daleen Bo   Medical Decision Making: Penile rash, unlikely to represent an STD.  Suspect allergic dermatitis.  Doubt chemical injury.  CRITICAL CARE-no Performed by: Daleen Bo   Nursing Notes Reviewed/ Care Coordinated Applicable Imaging Reviewed Interpretation of Laboratory Data incorporated into ED treatment  The patient appears reasonably screened and/or stabilized for discharge and I doubt any other medical condition or other [facility] requiring further screening, evaluation, or treatment in the ED at this time prior to discharge.  Plan: Home Medications-OTC as needed.  Use cortisone cream 0.5%, 3 times daily on rash, as needed; Home Treatments-keeping this clean; return here if the recommended treatment, does not improve the symptoms; Recommended follow up-PCP, as needed with     Final Clinical Impressions(s) / ED Diagnoses   Final diagnoses:  Allergic dermatitis    ED Discharge Orders    None       Daleen Bo, MD 09/30/17 1642

## 2018-02-11 ENCOUNTER — Emergency Department (HOSPITAL_COMMUNITY)
Admission: EM | Admit: 2018-02-11 | Discharge: 2018-02-11 | Disposition: A | Discharge status: Home / Self Care | Payer: Self-pay | Attending: Emergency Medicine | Admitting: Emergency Medicine

## 2018-02-11 ENCOUNTER — Encounter (HOSPITAL_COMMUNITY): Payer: Self-pay | Admitting: Emergency Medicine

## 2018-02-11 ENCOUNTER — Emergency Department (HOSPITAL_COMMUNITY): Payer: Self-pay

## 2018-02-11 DIAGNOSIS — E119 Type 2 diabetes mellitus without complications: Secondary | ICD-10-CM | POA: Insufficient documentation

## 2018-02-11 DIAGNOSIS — J069 Acute upper respiratory infection, unspecified: Secondary | ICD-10-CM | POA: Insufficient documentation

## 2018-02-11 MED ORDER — BENZONATATE 100 MG PO CAPS: 100.0000 mg | ORAL_CAPSULE | Freq: Three times a day (TID) | ORAL | 0 refills | Status: DC

## 2018-02-11 MED ORDER — IBUPROFEN 800 MG PO TABS: 800.0000 mg | ORAL_TABLET | Freq: Three times a day (TID) | ORAL | 0 refills | Status: DC

## 2018-02-11 MED ORDER — FLUTICASONE PROPIONATE 50 MCG/ACT NA SUSP: 1.0000 | Freq: Every day | NASAL | 0 refills | Status: DC

## 2018-02-11 MED ORDER — BENZONATATE 100 MG PO CAPS
100.0000 mg | ORAL_CAPSULE | Freq: Once | ORAL | Status: AC
  Administered 2018-02-11: 100 mg via ORAL
  Filled 2018-02-11: qty 1

## 2018-02-11 NOTE — ED Provider Notes (Signed)
[facility] DEPT Provider Note   CSN: [id number] Arrival date & time: 02/11/18  1145     History   Chief Complaint Chief Complaint  Patient presents with  . Cough    HPI Willie Spears is a 27 y.o. male with a  Hx of T2DM who presents to the ED with complaints of URI symptoms for the past 2 to 3 days.  Patient reports congestion, rhinorrhea, and productive cough with green mucus sputum.  He has tried Mucinex as well as ibuprofen over-the-counter with some improvement.  He states that he does work outside in the cold and feels this may be aggravating his symptoms.  No other specific alleviating or aggravating factors.  Denies fever, chills, vomiting, diarrhea, shortness of breath, or chest pain.  HPI  Past Medical History:  Diagnosis Date  . Diabetes mellitus without complication [facility])     Patient Active Problem List   Diagnosis Date Noted  . Type 2 diabetes mellitus ([place]) 03/29/2015  . Vitamin D deficiency 03/29/2015  . Fatigue 03/28/2015  . Family history of diabetes mellitus 03/28/2015    History reviewed. No pertinent surgical history.      Home Medications    Prior to Admission medications   Medication Sig Start Date End Date Taking? Authorizing Provider  BAYER CONTOUR NEXT TEST test strip CHECK FASTING BLOOD SUGAR ONCE DAILY. DIAGNOSIS TYPE 2 DIABETES ICD 10 E11.8 05/22/15   Marcial Pacas, DO  Blood Glucose Monitoring Suppl (BAYER CONTOUR NEXT MONITOR) w/Device KIT Check fasting blood sugar once daily. Diagnosis Type 2 Diabetes ICD 10 E11.8 04/10/15   Hommel, Hilliard Clark, DO  Canagliflozin-Metformin HCl (INVOKAMET) [phone] MG TABS One by mouth twice a day. 04/09/15   Marcial Pacas, DO  Lancets 30G MISC Check fasting blood sugar once daily. Diagnosis Type 2 Diabetes ICD 10 E11.8 04/10/15   Hommel, [name], DO  naproxen (NAPROSYN) 500 MG tablet Take 1 tablet (500 mg total) by mouth 2 (two) times daily with a meal. 09/01/17   Caccavale, Sophia, PA-C  Vitamin  D, Ergocalciferol, (DRISDOL) 50000 units CAPS capsule Take 1 capsule (50,000 Units total) by mouth every 7 (seven) days. Recheck Vitamin D in 3 Months 03/29/15   Marcial Pacas, DO    Family History Family History  Problem Relation Age of Onset  . Diabetes Mother   . Diabetes Father     Social History Social History   Tobacco Use  . Smoking status: Never Smoker  . Smokeless tobacco: Never Used  Substance Use Topics  . Alcohol use: No  . Drug use: Yes    Types: Marijuana     Allergies   Patient has no known allergies.   Review of Systems Review of Systems  Constitutional: Negative for chills and fever.  HENT: Positive for congestion and rhinorrhea. Negative for ear pain and sore throat.   Respiratory: Positive for cough. Negative for shortness of breath.   Cardiovascular: Negative for chest pain.  Gastrointestinal: Negative for abdominal pain, diarrhea and vomiting.     Physical Exam Updated Vital Signs BP (!) 141/99 (BP Location: Right Arm)   Pulse (!) 108   Temp 98.7 F (37.1 C) (Oral)   Resp 19   SpO2 99%   Physical Exam Vitals signs and nursing note reviewed.  Constitutional:      General: He is not in acute distress.    Appearance: He is well-developed. He is not toxic-appearing.  HENT:     Head: Normocephalic and atraumatic.  Right Ear: Tympanic membrane, ear canal and external ear normal. Tympanic membrane is not perforated, erythematous, retracted or bulging.     Left Ear: Tympanic membrane, ear canal and external ear normal. Tympanic membrane is not perforated, erythematous, retracted or bulging.     Nose: Mucosal edema present.     Right Sinus: No maxillary sinus tenderness or frontal sinus tenderness.     Left Sinus: No maxillary sinus tenderness or frontal sinus tenderness.     Mouth/Throat:     Mouth: Mucous membranes are dry.     Pharynx: Posterior oropharyngeal erythema (mild) present. No pharyngeal swelling, oropharyngeal exudate or uvula  swelling.     Comments: Uvula midline. Posterior oropharynx is symmetric appearing. Patient tolerating own secretions without difficulty. No trismus. No drooling. No hot potato voice. No swelling beneath the tongue, submandibular compartment is soft.  Eyes:     General:        Right eye: No discharge.        Left eye: No discharge.     Conjunctiva/sclera: Conjunctivae normal.  Neck:     Musculoskeletal: Neck supple. No neck rigidity.  Cardiovascular:     Rate and Rhythm: Regular rhythm. Tachycardia present.  Pulmonary:     Effort: Pulmonary effort is normal. No respiratory distress.     Breath sounds: Normal breath sounds. No wheezing, rhonchi or rales.  Abdominal:     General: There is no distension.     Palpations: Abdomen is soft.     Tenderness: There is no abdominal tenderness.  Lymphadenopathy:     Cervical: No cervical adenopathy.  Skin:    General: Skin is warm and dry.     Findings: No rash.  Neurological:     Mental Status: He is alert.     Comments: Clear speech.   Psychiatric:        Behavior: Behavior normal.      ED Treatments / Results  Labs (all labs ordered are listed, but only abnormal results are displayed) Labs Reviewed - No data to display  EKG None  Radiology No results found.  Procedures Procedures (including critical care time)  Medications Ordered in ED Medications - No data to display   Initial Impression / Assessment and Plan / ED Course  I have reviewed the triage vital signs and the nursing notes.  Pertinent labs & imaging results that were available during my care of the patient were reviewed by me and considered in my medical decision making (see chart for details).    Patient presents with URI type symptoms.  Patient is nontoxic appearing, in no apparent distress, mild tachycardia & HTN- doubt HTN emergency, PCP recheck, MM a bit dry, encouraged oral fluids. Patient is afebrile in the ED, lungs are CTA, CXR negative for  infiltrate, doubt pneumonia. There is no wheezing or signs of respiratory distress. Sxs onset < 7 days, afebrile, no sinus tenderness, doubt acute bacterial sinusitis. Centor score 0, doubt strep pharyngitis. No evidence of AOM on exam. No meningeal signs. Suspect viral vs allergic etiology at this time and will treat supportively with Ibuprofen, Flonase, and Tessalon. I discussed results, treatment plan, need for PCP follow-up, and return precautions with the patient. Provided opportunity for questions, patient confirmed understanding and is in agreement with plan.   Final Clinical Impressions(s) / ED Diagnoses   Final diagnoses:  URI with cough and congestion    ED Discharge Orders         Ordered    fluticasone (  FLONASE) 50 MCG/ACT nasal spray  Daily     02/11/18 1306    benzonatate (TESSALON) 100 MG capsule  Every 8 hours     02/11/18 1306    ibuprofen (ADVIL,MOTRIN) 800 MG tablet  3 times daily     02/11/18 [address] Glynda Jaeger, PA-C 02/11/18 1338    Nat Christen, MD 02/12/18 1259

## 2018-02-11 NOTE — Discharge Instructions (Signed)
You were seen in the emergency today for upper respiratory symptoms, we suspect your symptoms are related to allergies or a virus at this time. Your chest xray was normal. I have prescribed you multiple medications to treat your symptoms.   -Flonase to be used 1 spray in each nostril daily.  This medication is used to treat your congestion.  -Tessalon can be taken once every 8 hours as needed.  This medication is used to treat your cough.  -Ibuprofen to be taken once every 8 hours as needed for pain. Please take this medicine with food as it can cause stomach upset and at worst stomach bleeding. Do not take other NSAIDs such as motrin, aleve, advil, naproxen, mobic, etc as they are similar. You make take tylenol per over the counter dosing with this medicine safely.  We have prescribed you new medication(s) today. Discuss the medications prescribed today with your pharmacist as they can have adverse effects and interactions with your other medicines including over the counter and prescribed medications. Seek medical evaluation if you start to experience new or abnormal symptoms after taking one of these medicines, seek care immediately if you start to experience difficulty breathing, feeling of your throat closing, facial swelling, or rash as these could be indications of a more serious allergic reaction  You will need to follow-up with your primary care provider in 1 week if your symptoms have not improved.  If you do not have a primary care provider one is provided in your discharge instructions.  Return to the emergency department for any new or worsening symptoms including but not limited to persistent fever for 5 days, difficulty breathing, chest pain, rashes, passing out, or any other concerns.    Please also follow up with primary care for a recheck of your blood pressure as this was elevated in the ER today.

## 2018-02-11 NOTE — ED Triage Notes (Signed)
Pt c/o cough that is productive with green phlegm and congestion for 3 days.

## 2018-04-28 ENCOUNTER — Other Ambulatory Visit: Payer: Self-pay

## 2018-04-28 DIAGNOSIS — L0231 Cutaneous abscess of buttock: Secondary | ICD-10-CM | POA: Insufficient documentation

## 2018-04-28 DIAGNOSIS — N492 Inflammatory disorders of scrotum: Secondary | ICD-10-CM | POA: Insufficient documentation

## 2018-04-28 DIAGNOSIS — E119 Type 2 diabetes mellitus without complications: Secondary | ICD-10-CM | POA: Insufficient documentation

## 2018-04-28 DIAGNOSIS — Z79899 Other long term (current) drug therapy: Secondary | ICD-10-CM | POA: Insufficient documentation

## 2018-04-29 ENCOUNTER — Emergency Department (HOSPITAL_COMMUNITY)
Admission: EM | Admit: 2018-04-29 | Discharge: 2018-04-29 | Disposition: A | Discharge status: Home / Self Care | Payer: Self-pay | Attending: Emergency Medicine | Admitting: Emergency Medicine

## 2018-04-29 ENCOUNTER — Other Ambulatory Visit: Payer: Self-pay

## 2018-04-29 ENCOUNTER — Encounter (HOSPITAL_COMMUNITY): Payer: Self-pay | Admitting: Emergency Medicine

## 2018-04-29 ENCOUNTER — Emergency Department (HOSPITAL_COMMUNITY): Payer: Self-pay

## 2018-04-29 DIAGNOSIS — L0231 Cutaneous abscess of buttock: Secondary | ICD-10-CM

## 2018-04-29 DIAGNOSIS — N492 Inflammatory disorders of scrotum: Secondary | ICD-10-CM

## 2018-04-29 MED ORDER — CLINDAMYCIN HCL 300 MG PO CAPS
300.0000 mg | ORAL_CAPSULE | Freq: Once | ORAL | Status: AC
  Administered 2018-04-29: 300 mg via ORAL
  Filled 2018-04-29: qty 1

## 2018-04-29 MED ORDER — ONDANSETRON 8 MG PO TBDP
8.0000 mg | ORAL_TABLET | Freq: Once | ORAL | Status: AC
  Administered 2018-04-29: 8 mg via ORAL
  Filled 2018-04-29: qty 1

## 2018-04-29 MED ORDER — CLINDAMYCIN HCL 300 MG PO CAPS: 300.0000 mg | ORAL_CAPSULE | Freq: Four times a day (QID) | ORAL | 0 refills | Status: DC

## 2018-04-29 MED ORDER — HYDROCODONE-ACETAMINOPHEN 5-325 MG PO TABS
2.0000 | ORAL_TABLET | Freq: Once | ORAL | Status: AC
  Administered 2018-04-29: 2 via ORAL
  Filled 2018-04-29: qty 2

## 2018-04-29 MED ORDER — LIDOCAINE-EPINEPHRINE (PF) 2 %-1:200000 IJ SOLN
20.0000 mL | Freq: Once | INTRAMUSCULAR | Status: AC
  Administered 2018-04-29: 20 mL
  Filled 2018-04-29: qty 20

## 2018-04-29 MED ORDER — HYDROCODONE-ACETAMINOPHEN 5-325 MG PO TABS
2.0000 | ORAL_TABLET | Freq: Once | ORAL | Status: AC
Start: 2018-04-29 — End: 2018-04-29
  Administered 2018-04-29: 2 via ORAL
  Filled 2018-04-29: qty 2

## 2018-04-29 MED ORDER — HYDROCODONE-ACETAMINOPHEN 5-325 MG PO TABS: 1.0000 | ORAL_TABLET | ORAL | 0 refills | Status: DC | PRN

## 2018-04-29 NOTE — ED Notes (Signed)
Bed: [facility] Expected date:  Expected time:  Means of arrival:  Comments: abscess

## 2018-04-29 NOTE — ED Provider Notes (Signed)
[facility] DEPT Provider Note: Georgena Spurling, MD, FACEP  CSN: [id number] MRN: [id number] ARRIVAL: 04/28/18 at 2326 ROOM: [facility]  Abscess   HISTORY OF PRESENT ILLNESS  04/29/18 12:30 AM Willie Spears is a 28 y.o. male with several days of tenderness, swelling and purulent drainage from his left medial buttock near the scrotum.  He is subsequently developed tenderness and edema of the scrotum.  He rates his pain as moderate to severe, worse with certain positions and movement.  He has not had a fever.   Past Medical History:  Diagnosis Date  . Diabetes mellitus without complication ([place])     History reviewed. No pertinent surgical history.  Family History  Problem Relation Age of Onset  . Diabetes Mother   . Diabetes Father     Social History   Tobacco Use  . Smoking status: Never Smoker  . Smokeless tobacco: Never Used  Substance Use Topics  . Alcohol use: No  . Drug use: Yes    Types: Marijuana    Prior to Admission medications   Medication Sig Start Date End Date Taking? Authorizing Provider  BAYER CONTOUR NEXT TEST test strip CHECK FASTING BLOOD SUGAR ONCE DAILY. DIAGNOSIS TYPE 2 DIABETES ICD 10 E11.8 05/22/15   Hommel, Hilliard Clark, DO  benzonatate (TESSALON) 100 MG capsule Take 1 capsule (100 mg total) by mouth every 8 (eight) hours. 02/11/18   Petrucelli, Samantha R, PA-C  Blood Glucose Monitoring Suppl (BAYER CONTOUR NEXT MONITOR) w/Device KIT Check fasting blood sugar once daily. Diagnosis Type 2 Diabetes ICD 10 E11.8 04/10/15   Hommel, Hilliard Clark, DO  Canagliflozin-Metformin HCl (INVOKAMET) [phone] MG TABS One by mouth twice a day. 04/09/15   Hommel, Hilliard Clark, DO  fluticasone (FLONASE) 50 MCG/ACT nasal spray Place 1 spray into both nostrils daily. 02/11/18   Petrucelli, Samantha R, PA-C  ibuprofen (ADVIL,MOTRIN) 800 MG tablet Take 1 tablet (800 mg total) by mouth 3 (three) times daily. 02/11/18   Petrucelli, Samantha R, PA-C  Lancets 30G MISC Check fasting  blood sugar once daily. Diagnosis Type 2 Diabetes ICD 10 E11.8 04/10/15   Hommel, [name], DO  naproxen (NAPROSYN) 500 MG tablet Take 1 tablet (500 mg total) by mouth 2 (two) times daily with a meal. 09/01/17   Caccavale, Sophia, PA-C  Vitamin D, Ergocalciferol, (DRISDOL) 50000 units CAPS capsule Take 1 capsule (50,000 Units total) by mouth every 7 (seven) days. Recheck Vitamin D in 3 Months 03/29/15   Marcial Pacas, DO    Allergies Patient has no known allergies.   REVIEW OF SYSTEMS  Negative except as noted here or in the History of Present Illness.   PHYSICAL EXAMINATION  Initial Vital Signs Blood pressure (!) 146/94, pulse (!) 108, temperature 98.9 F (37.2 C), temperature source Oral, resp. rate 20, height _0  (1.803 m), weight 113.4 kg, SpO2 96 %.  Examination General: Well-developed, well-nourished male in no acute distress; appearance consistent with age of record HENT: normocephalic; atraumatic Eyes: Normal appearance Neck: supple Heart: regular rate and rhythm Lungs: clear to auscultation bilaterally Abdomen: soft; nondistended; nontender; bowel sounds present GU: Tanner V male, circumcised; tenderness and edema of scrotum without signs of Fournier's gangrene; tender, fluctuant area with purulent drainage right medial buttock near scrotum Extremities: No deformity; full range of motion; pulses normal Neurologic: Awake, alert and oriented; motor function intact in all extremities and symmetric; no facial droop Skin: Warm and dry Psychiatric: Normal mood and affect   RESULTS  Summary of this visit's  results, reviewed by myself:   EKG Interpretation  Date/Time:    Ventricular Rate:    PR Interval:    QRS Duration:   QT Interval:    QTC Calculation:   R Axis:     Text Interpretation:        Laboratory Studies: No results found for this or any previous visit (from the past 24 hour(s)). Imaging Studies: US Scrotum  Result Date: 04/29/2018 CLINICAL DATA:  Palpable  lump posterior to the right scrotum for 1 week. EXAM: ULTRASOUND OF SCROTUM TECHNIQUE: Complete ultrasound examination of the testicles, epididymis, and other scrotal structures was performed. COMPARISON:  None. FINDINGS: Right testicle Measurements: 4.8 x 2.8 x 3.8 cm. No mass or microlithiasis visualized. Testicular flow is demonstrated on color flow Doppler imaging. Left testicle Measurements: 4.8 x 2.8 x 3 cm. No mass or microlithiasis visualized. Testicular flow is demonstrated on color Doppler imaging. Right epididymis:  Normal in size and appearance. Left epididymis:  Normal in size and appearance. Hydrocele:  Small bilateral hydroceles are present. Varicocele:  Right varicocele is present. Imaging of the area of palpable lump posterior to the right scrotum demonstrates skin thickening with heterogeneous edematous tissue. No significant increased flow. No loculated collection to suggest abscess. IMPRESSION: 1. Normal ultrasound appearance of the testicles. No evidence of testicular mass for torsion. 2. Small bilateral hydroceles. 3. Small right varicocele. 4. Skin thickening and edematous tissue corresponding to the palpable abnormality. Likely cellulitis. No abscess identified. Electronically Signed   By: Lucienne Capers M.D.   On: 04/29/2018 01:57    ED COURSE and MDM  Nursing notes and initial vitals signs, including pulse oximetry, reviewed.  Vitals:   04/28/18 2356 04/29/18 0006  BP: (!) 146/94   Pulse: (!) 108   Resp: 20   Temp: 98.9 F (37.2 C)   TempSrc: Oral   SpO2: 96%   Weight:  113.4 kg  Height:  _0  (1.803 m)   Patient advised to remove packing in 3 days if symptoms are improving or to return to the ED if symptoms are worsening.  Will start on antibiotics for associated cellulitis.  PROCEDURES   INCISION AND DRAINAGE Performed by: Karen Chafe Danica Camarena Consent: Verbal consent obtained. Risks and benefits: risks, benefits and alternatives were discussed Type: abscess  Body  area: right buttock  Anesthesia: local infiltration  Incision was made with a scalpel.  Local anesthetic: lidocaine 2% with epinephrine  Anesthetic total: 5 ml  Complexity: complex Blunt dissection to break up loculations  Drainage: purulent  Drainage amount: copious  Packing material: 1/4 in iodoform gauze  Patient tolerance: Patient tolerated the procedure well with no immediate complications.   ED DIAGNOSES     ICD-10-CM   1. Abscess of buttock, right L02.31   2. Cellulitis of scrotum N49.2        Natalin Bible, Jenny Reichmann, MD 04/29/18 [phone]

## 2018-04-29 NOTE — ED Notes (Signed)
Bed: [facility]4 Expected date:  Expected time:  Means of arrival:  Comments: 

## 2018-05-01 ENCOUNTER — Emergency Department (HOSPITAL_COMMUNITY): Payer: Self-pay

## 2018-05-01 ENCOUNTER — Inpatient Hospital Stay (HOSPITAL_COMMUNITY)
Admission: EM | Admit: 2018-05-01 | Discharge: 2018-05-05 | DRG: 717 | Disposition: A | Discharge status: Home / Self Care | Payer: Self-pay | Attending: Internal Medicine | Admitting: Internal Medicine

## 2018-05-01 ENCOUNTER — Other Ambulatory Visit: Payer: Self-pay

## 2018-05-01 ENCOUNTER — Encounter (HOSPITAL_COMMUNITY): Payer: Self-pay | Admitting: *Deleted

## 2018-05-01 DIAGNOSIS — Z7984 Long term (current) use of oral hypoglycemic drugs: Secondary | ICD-10-CM

## 2018-05-01 DIAGNOSIS — L02215 Cutaneous abscess of perineum: Secondary | ICD-10-CM | POA: Diagnosis present

## 2018-05-01 DIAGNOSIS — N492 Inflammatory disorders of scrotum: Secondary | ICD-10-CM

## 2018-05-01 DIAGNOSIS — Z6835 Body mass index (BMI) 35.0-35.9, adult: Secondary | ICD-10-CM

## 2018-05-01 DIAGNOSIS — Z79899 Other long term (current) drug therapy: Secondary | ICD-10-CM

## 2018-05-01 DIAGNOSIS — D509 Iron deficiency anemia, unspecified: Secondary | ICD-10-CM | POA: Diagnosis present

## 2018-05-01 DIAGNOSIS — I10 Essential (primary) hypertension: Secondary | ICD-10-CM | POA: Diagnosis present

## 2018-05-01 DIAGNOSIS — E669 Obesity, unspecified: Secondary | ICD-10-CM | POA: Diagnosis present

## 2018-05-01 DIAGNOSIS — N179 Acute kidney failure, unspecified: Secondary | ICD-10-CM | POA: Diagnosis not present

## 2018-05-01 DIAGNOSIS — E1165 Type 2 diabetes mellitus with hyperglycemia: Secondary | ICD-10-CM | POA: Diagnosis present

## 2018-05-01 DIAGNOSIS — Z833 Family history of diabetes mellitus: Secondary | ICD-10-CM

## 2018-05-01 DIAGNOSIS — R739 Hyperglycemia, unspecified: Secondary | ICD-10-CM

## 2018-05-01 DIAGNOSIS — N493 Fournier gangrene: Principal | ICD-10-CM | POA: Diagnosis present

## 2018-05-01 DIAGNOSIS — D72829 Elevated white blood cell count, unspecified: Secondary | ICD-10-CM

## 2018-05-01 DIAGNOSIS — L03315 Cellulitis of perineum: Secondary | ICD-10-CM | POA: Diagnosis present

## 2018-05-01 DIAGNOSIS — Z9119 Patient's noncompliance with other medical treatment and regimen: Secondary | ICD-10-CM

## 2018-05-01 HISTORY — DX: Inflammatory disorders of scrotum: N49.2

## 2018-05-01 LAB — COMPREHENSIVE METABOLIC PANEL
ALT: 13 U/L (ref 0–44)
AST: 13 U/L — ABNORMAL LOW (ref 15–41)
Albumin: 3.5 g/dL (ref 3.5–5.0)
Alkaline Phosphatase: 140 U/L — ABNORMAL HIGH (ref 38–126)
Anion gap: 13 (ref 5–15)
BUN: 15 mg/dL (ref 6–20)
CO2: 25 mmol/L (ref 22–32)
Calcium: 8.8 mg/dL — ABNORMAL LOW (ref 8.9–10.3)
Chloride: 94 mmol/L — ABNORMAL LOW (ref 98–111)
Creatinine, Ser: 1.18 mg/dL (ref 0.61–1.24)
GFR calc Af Amer: >60 mL/min (ref >60)
GFR calc non Af Amer: >60 mL/min (ref >60)
Glucose, Bld: 447 mg/dL — ABNORMAL HIGH (ref 70–99)
Potassium: 4 mmol/L (ref 3.5–5.1)
Sodium: 132 mmol/L — ABNORMAL LOW (ref 135–145)
Total Bilirubin: 0.5 mg/dL (ref 0.3–1.2)
Total Protein: 8 g/dL (ref 6.5–8.1)

## 2018-05-01 LAB — URINALYSIS, ROUTINE W REFLEX MICROSCOPIC
BILIRUBIN URINE: NEGATIVE
Glucose, UA: >=500 mg/dL — AB
Hgb urine dipstick: NEGATIVE
KETONES UR: 5 mg/dL — AB
Leukocytes,Ua: NEGATIVE
Nitrite: NEGATIVE
Protein, ur: 30 mg/dL — AB
Specific Gravity, Urine: 1.026 (ref 1.005–1.030)
pH: 6 (ref 5.0–8.0)

## 2018-05-01 LAB — CBC WITH DIFFERENTIAL/PLATELET
ABS IMMATURE GRANULOCYTES: 0.19 10*3/uL — AB (ref 0.00–0.07)
Basophils Absolute: 0.1 10*3/uL (ref 0.0–0.1)
Basophils Relative: 0 %
Eosinophils Absolute: 0.1 10*3/uL (ref 0.0–0.5)
Eosinophils Relative: 0 %
HCT: 44.6 % (ref 39.0–52.0)
Hemoglobin: 13.9 g/dL (ref 13.0–17.0)
IMMATURE GRANULOCYTES: 1 %
Lymphocytes Relative: 10 %
Lymphs Abs: 2 10*3/uL (ref 0.7–4.0)
MCH: 23 pg — ABNORMAL LOW (ref 26.0–34.0)
MCHC: 31.2 g/dL (ref 30.0–36.0)
MCV: 73.7 fL — ABNORMAL LOW (ref 80.0–100.0)
Monocytes Absolute: 1.6 10*3/uL — ABNORMAL HIGH (ref 0.1–1.0)
Monocytes Relative: 8 %
Neutro Abs: 16.4 10*3/uL — ABNORMAL HIGH (ref 1.7–7.7)
Neutrophils Relative %: 81 %
PLATELETS: 489 10*3/uL — AB (ref 150–400)
RBC: 6.05 MIL/uL — ABNORMAL HIGH (ref 4.22–5.81)
RDW: 13.2 % (ref 11.5–15.5)
WBC: 20.4 10*3/uL — ABNORMAL HIGH (ref 4.0–10.5)
nRBC: 0 % (ref 0.0–0.2)

## 2018-05-01 LAB — CBG MONITORING, ED
Glucose-Capillary: 438 mg/dL — ABNORMAL HIGH (ref 70–99)
Glucose-Capillary: 366 mg/dL — ABNORMAL HIGH (ref 70–99)
Glucose-Capillary: 410 mg/dL — ABNORMAL HIGH (ref 70–99)

## 2018-05-01 LAB — SURGICAL PCR SCREEN
MRSA, PCR: NEGATIVE
Staphylococcus aureus: NEGATIVE

## 2018-05-01 LAB — HEMOGLOBIN A1C
Hgb A1c MFr Bld: 12.4 % — ABNORMAL HIGH (ref 4.8–5.6)
Mean Plasma Glucose: 309.18 mg/dL

## 2018-05-01 LAB — GLUCOSE, RANDOM: Glucose, Bld: 416 mg/dL — ABNORMAL HIGH (ref 70–99)

## 2018-05-01 LAB — LACTIC ACID, PLASMA: Lactic Acid, Venous: 1.1 mmol/L (ref 0.5–1.9)

## 2018-05-01 LAB — GLUCOSE, CAPILLARY: Glucose-Capillary: 166 mg/dL — ABNORMAL HIGH (ref 70–99)

## 2018-05-01 MED ORDER — MORPHINE SULFATE (PF) 4 MG/ML IV SOLN
4.0000 mg | Freq: Once | INTRAVENOUS | Status: AC
  Administered 2018-05-01: 4 mg via INTRAVENOUS
  Filled 2018-05-01: qty 1

## 2018-05-01 MED ORDER — PIPERACILLIN-TAZOBACTAM 3.375 G IVPB 30 MIN
3.3750 g | INTRAVENOUS | Status: AC
  Administered 2018-05-01: 3.375 g via INTRAVENOUS
  Filled 2018-05-01: qty 50

## 2018-05-01 MED ORDER — OXYCODONE HCL 5 MG PO TABS
5.0000 mg | ORAL_TABLET | ORAL | Status: DC | PRN
  Administered 2018-05-01 – 2018-05-05 (×14): 5 mg via ORAL
  Filled 2018-05-01 (×16): qty 1

## 2018-05-01 MED ORDER — SODIUM CHLORIDE (PF) 0.9 % IJ SOLN
INTRAMUSCULAR | Status: AC
  Filled 2018-05-01: qty 50

## 2018-05-01 MED ORDER — SODIUM CHLORIDE 0.9 % IV SOLN
INTRAVENOUS | Status: DC
  Administered 2018-05-01 – 2018-05-03 (×3): via INTRAVENOUS

## 2018-05-01 MED ORDER — INSULIN ASPART 100 UNIT/ML ~~LOC~~ SOLN
0.0000 [IU] | Freq: Three times a day (TID) | SUBCUTANEOUS | Status: DC
  Administered 2018-05-01: 15 [IU] via SUBCUTANEOUS
  Administered 2018-05-02 (×2): 5 [IU] via SUBCUTANEOUS
  Administered 2018-05-03: 2 [IU] via SUBCUTANEOUS
  Administered 2018-05-03: 3 [IU] via SUBCUTANEOUS
  Administered 2018-05-03: 2 [IU] via SUBCUTANEOUS
  Administered 2018-05-04: 3 [IU] via SUBCUTANEOUS
  Administered 2018-05-04: 2 [IU] via SUBCUTANEOUS

## 2018-05-01 MED ORDER — POLYETHYLENE GLYCOL 3350 17 G PO PACK
17.0000 g | PACK | Freq: Every day | ORAL | Status: DC
  Administered 2018-05-01 – 2018-05-05 (×2): 17 g via ORAL
  Filled 2018-05-01 (×3): qty 1

## 2018-05-01 MED ORDER — INSULIN ASPART 100 UNIT/ML ~~LOC~~ SOLN
0.0000 [IU] | Freq: Every day | SUBCUTANEOUS | Status: DC
  Administered 2018-05-02: 2 [IU] via SUBCUTANEOUS

## 2018-05-01 MED ORDER — SODIUM CHLORIDE 0.9 % IV BOLUS
1000.0000 mL | Freq: Once | INTRAVENOUS | Status: AC
  Administered 2018-05-01: 1000 mL via INTRAVENOUS

## 2018-05-01 MED ORDER — PIPERACILLIN-TAZOBACTAM 3.375 G IVPB
3.3750 g | Freq: Three times a day (TID) | INTRAVENOUS | Status: DC
  Administered 2018-05-01 – 2018-05-04 (×7): 3.375 g via INTRAVENOUS
  Filled 2018-05-01 (×7): qty 50

## 2018-05-01 MED ORDER — VANCOMYCIN HCL 10 G IV SOLR
2000.0000 mg | INTRAVENOUS | Status: AC
  Administered 2018-05-01: 2000 mg via INTRAVENOUS
  Filled 2018-05-01: qty 2000

## 2018-05-01 MED ORDER — HYDROCODONE-ACETAMINOPHEN 5-325 MG PO TABS
1.0000 | ORAL_TABLET | ORAL | Status: DC | PRN
  Administered 2018-05-04 – 2018-05-05 (×4): 1 via ORAL
  Filled 2018-05-01 (×5): qty 1

## 2018-05-01 MED ORDER — ENOXAPARIN SODIUM 60 MG/0.6ML ~~LOC~~ SOLN
55.0000 mg | SUBCUTANEOUS | Status: DC
  Administered 2018-05-01 – 2018-05-04 (×4): 55 mg via SUBCUTANEOUS
  Filled 2018-05-01 (×4): qty 0.6

## 2018-05-01 MED ORDER — IOPAMIDOL (ISOVUE-300) INJECTION 61%
100.0000 mL | Freq: Once | INTRAVENOUS | Status: AC | PRN
  Administered 2018-05-01: 100 mL via INTRAVENOUS

## 2018-05-01 MED ORDER — VANCOMYCIN HCL 10 G IV SOLR
1250.0000 mg | Freq: Two times a day (BID) | INTRAVENOUS | Status: DC
  Administered 2018-05-02 – 2018-05-03 (×4): 1250 mg via INTRAVENOUS
  Filled 2018-05-01 (×4): qty 1250

## 2018-05-01 MED ORDER — ONDANSETRON HCL 4 MG PO TABS: 4.0000 mg | ORAL_TABLET | Freq: Four times a day (QID) | ORAL | Status: DC | PRN

## 2018-05-01 MED ORDER — IOPAMIDOL (ISOVUE-300) INJECTION 61%
INTRAVENOUS | Status: AC
  Filled 2018-05-01: qty 100

## 2018-05-01 MED ORDER — ACETAMINOPHEN 325 MG PO TABS: 650.0000 mg | ORAL_TABLET | Freq: Four times a day (QID) | ORAL | Status: DC | PRN

## 2018-05-01 MED ORDER — INSULIN GLARGINE 100 UNIT/ML ~~LOC~~ SOLN
12.0000 [IU] | Freq: Every day | SUBCUTANEOUS | Status: DC
  Administered 2018-05-01 – 2018-05-03 (×3): 12 [IU] via SUBCUTANEOUS
  Filled 2018-05-01 (×4): qty 0.12

## 2018-05-01 MED ORDER — ACETAMINOPHEN 650 MG RE SUPP: 650.0000 mg | Freq: Four times a day (QID) | RECTAL | Status: DC | PRN

## 2018-05-01 MED ORDER — INSULIN ASPART 100 UNIT/ML ~~LOC~~ SOLN
4.0000 [IU] | Freq: Three times a day (TID) | SUBCUTANEOUS | Status: DC
  Administered 2018-05-01 – 2018-05-04 (×8): 4 [IU] via SUBCUTANEOUS

## 2018-05-01 MED ORDER — DOCUSATE SODIUM 100 MG PO CAPS
100.0000 mg | ORAL_CAPSULE | Freq: Two times a day (BID) | ORAL | Status: DC
  Administered 2018-05-03 – 2018-05-05 (×5): 100 mg via ORAL
  Filled 2018-05-01 (×7): qty 1

## 2018-05-01 MED ORDER — INFLUENZA VAC SPLIT QUAD 0.5 ML IM SUSY: 0.5000 mL | PREFILLED_SYRINGE | INTRAMUSCULAR | Status: DC

## 2018-05-01 MED ORDER — KETOROLAC TROMETHAMINE 30 MG/ML IJ SOLN
30.0000 mg | Freq: Four times a day (QID) | INTRAMUSCULAR | Status: DC | PRN
  Administered 2018-05-02 – 2018-05-04 (×6): 30 mg via INTRAVENOUS
  Filled 2018-05-01 (×6): qty 1

## 2018-05-01 MED ORDER — ONDANSETRON HCL 4 MG/2ML IJ SOLN: 4.0000 mg | Freq: Four times a day (QID) | INTRAMUSCULAR | Status: DC | PRN

## 2018-05-01 NOTE — ED Triage Notes (Signed)
Pt was seen 2 days ago for abscess to buttocks and cellulitis of scrotum. Pt had abscess drained and states pain is better in buttocks. Pt states his scrotum is still painful. Pt has taken 9 doses of the antibiotic, has taken all of the Vicodin prescribed.

## 2018-05-01 NOTE — ED Notes (Signed)
Patient transported to CT 

## 2018-05-01 NOTE — Progress Notes (Signed)
Pharmacy Antibiotic Note  Willie Spears is a 28 y.o. male admitted on 05/01/2018 with wound infection, seen in ED 2/27 with gluteal abscess, drained/packed > on Clindamycin. Return 2/29 with increased swelling, scrotal involvement CT: air tracking from wound to perineal region   Pharmacy has been consulted for Vancomycin and Zosyn dosing.  Plan: Zosyn EI Vancomycin 2gm x1 then 1250mg  q12    Temp (24hrs), Avg:97.9 F (36.6 C), Min:97.6 F (36.4 C), Max:98.2 F (36.8 C)  Recent Labs  Lab 05/01/18 0821  WBC 20.4*  CREATININE 1.18  LATICACIDVEN 1.1    Estimated Creatinine Clearance: 121.4 mL/min (by C-G formula based on SCr of 1.18 mg/dL).    No Known Allergies  Antimicrobials this admission: 2/29 Zosyn >>  2/29 Vancomycin >>   Dose adjustments this admission:  Microbiology results: No cx ordered this admit No cx obtained from I&D 2/27  Thank you for allowing pharmacy to be a part of this patient's care.  Otho Bellows PharmD Pager [phone] 05/01/2018, 1:50 PM

## 2018-05-01 NOTE — ED Notes (Signed)
Urine culture sent down to lab with urinalysis. 

## 2018-05-01 NOTE — Progress Notes (Signed)
A consult was received from an ED physician for Vancomycin and Zosyn per pharmacy dosing.  The patient's profile has been reviewed for ht/wt/allergies/indication/available labs.    A one time order has been placed for Vancomycin 2000mg  and Zosyn 3.375gm IV.  Further antibiotics/pharmacy consults should be ordered by admitting physician if indicated.                       Thank you, Maryellen Pile, PharmD 05/01/2018  10:47 AM

## 2018-05-01 NOTE — Consult Note (Signed)
Urology Consult   Physician requesting consult: Berle Mull, MD  Reason for consult: Scrotal abscess  History of Present Illness: Willie Spears is a 28 y.o. male with the past medical history listed below as well as a history of a right gluteal abscess that was drained on 04/29/18 by Dr. Florina Ou.   The wound was packed and the patient was started on a one week course of clindamycin with instructions to remove the packing in two weeks, per the patient.  No wound cultures appear to have been ordered at that time.   The patient presents to the [facility] ED with a 24 hour history of worsening scrotal pain and swelling.  He currently has packing in his open right gluteal wound that has been draining foul smelling fluid since his I&D on 2/27.  He denies fever/chills at home and is afebrile in the ED.  CT pelvis from today shows a 10 cm x 3 cm soft tissue density with air tracking from his open wound and into the perineal region.  No overt fluid collection is seen.   The patient denies a history of voiding or storage urinary symptoms, hematuria, UTIs, STDs, urolithiasis, GU malignancy/trauma/surgery.  Past Medical History:  Diagnosis Date  . Diabetes mellitus without complication ([place])     History reviewed. No pertinent surgical history.  Current Hospital Medications:  Home Meds:  Current Meds  Medication Sig  . BAYER CONTOUR NEXT TEST test strip CHECK FASTING BLOOD SUGAR ONCE DAILY. DIAGNOSIS TYPE 2 DIABETES ICD 10 E11.8  . Blood Glucose Monitoring Suppl (BAYER CONTOUR NEXT MONITOR) w/Device KIT Check fasting blood sugar once daily. Diagnosis Type 2 Diabetes ICD 10 E11.8  . clindamycin (CLEOCIN) 300 MG capsule Take 1 capsule (300 mg total) by mouth 4 (four) times daily. X 7 days  . HYDROcodone-acetaminophen (NORCO) 5-325 MG tablet Take 1 tablet by mouth every 4 (four) hours as needed (for pain).  . Lancets 30G MISC Check fasting blood sugar once daily. Diagnosis Type 2 Diabetes ICD 10 E11.8  .  metFORMIN (GLUCOPHAGE) 1000 MG tablet Take 1,000 mg by mouth 2 (two) times daily with a meal.    Scheduled Meds: . iopamidol      . sodium chloride (PF)       Continuous Infusions: . vancomycin 2,000 mg (05/01/18 1142)   PRN Meds:.  Allergies: No Known Allergies  Family History  Problem Relation Age of Onset  . Diabetes Mother   . Diabetes Father     Social History:  reports that he has never smoked. He has never used smokeless tobacco. He reports current drug use. Drug: Marijuana. He reports that he does not drink alcohol.  ROS: A complete review of systems was performed.  All systems are negative except for pertinent findings as noted.  Physical Exam:  Vital signs in last 24 hours: Temp:  [97.6 F (36.4 C)-97.9 F (36.6 C)] 97.9 F (36.6 C) (02/29 1152) Pulse Rate:  [86-90] 86 (02/29 1152) Resp:  [16-19] 18 (02/29 1152) BP: (151-175)/(88-90) 151/88 (02/29 1152) SpO2:  [98 %-99 %] 99 % (02/29 1152) Constitutional:  Alert and oriented, No acute distress Cardiovascular: Regular rate and rhythm, No JVD Respiratory: Normal respiratory effort, Lungs clear bilaterally GI: Obese.  Abdomen is soft, nontender, nondistended, no abdominal masses GU: There is an open 2x2 cm wound with iodoform packing at the medial portion of the right gluteal fold ~3-4 cm from the anus.  The perineal and base of the scrotum are tender to palpation  and there is a 2 x 2 cm eschar at the base of the scrotum with no crepitus or palpable fluid collection.  Both testicles are palpable with no pain or lesion x2 Lymphatic: No lymphadenopathy Neurologic: Grossly intact, no focal deficits Psychiatric: Normal mood and affect  Laboratory Data:  Recent Labs    05/01/18 0821  WBC 20.4*  HGB 13.9  HCT 44.6  PLT 489*    Recent Labs    05/01/18 0821  NA 132*  K 4.0  CL 94*  GLUCOSE 447*  BUN 15  CALCIUM 8.8*  CREATININE 1.18     Results for orders placed or performed during the hospital  encounter of 05/01/18 (from the past 24 hour(s))  CBC with Differential     Status: Abnormal   Collection Time: 05/01/18  8:21 AM  Result Value Ref Range   WBC 20.4 (H) 4.0 - 10.5 K/uL   RBC 6.05 (H) 4.22 - 5.81 MIL/uL   Hemoglobin 13.9 13.0 - 17.0 g/dL   HCT 44.6 39.0 - 52.0 %   MCV 73.7 (L) 80.0 - 100.0 fL   MCH 23.0 (L) 26.0 - 34.0 pg   MCHC 31.2 30.0 - 36.0 g/dL   RDW 13.2 11.5 - 15.5 %   Platelets 489 (H) 150 - 400 K/uL   nRBC 0.0 0.0 - 0.2 %   Neutrophils Relative % 81 %   Neutro Abs 16.4 (H) 1.7 - 7.7 K/uL   Lymphocytes Relative 10 %   Lymphs Abs 2.0 0.7 - 4.0 K/uL   Monocytes Relative 8 %   Monocytes Absolute 1.6 (H) 0.1 - 1.0 K/uL   Eosinophils Relative 0 %   Eosinophils Absolute 0.1 0.0 - 0.5 K/uL   Basophils Relative 0 %   Basophils Absolute 0.1 0.0 - 0.1 K/uL   Immature Granulocytes 1 %   Abs Immature Granulocytes 0.19 (H) 0.00 - 0.07 K/uL  Comprehensive metabolic panel     Status: Abnormal   Collection Time: 05/01/18  8:21 AM  Result Value Ref Range   Sodium 132 (L) 135 - 145 mmol/L   Potassium 4.0 3.5 - 5.1 mmol/L   Chloride 94 (L) 98 - 111 mmol/L   CO2 25 22 - 32 mmol/L   Glucose, Bld 447 (H) 70 - 99 mg/dL   BUN 15 6 - 20 mg/dL   Creatinine, Ser 1.18 0.61 - 1.24 mg/dL   Calcium 8.8 (L) 8.9 - 10.3 mg/dL   Total Protein 8.0 6.5 - 8.1 g/dL   Albumin 3.5 3.5 - 5.0 g/dL   AST 13 (L) 15 - 41 U/L   ALT 13 0 - 44 U/L   Alkaline Phosphatase 140 (H) 38 - 126 U/L   Total Bilirubin 0.5 0.3 - 1.2 mg/dL   GFR calc non Af Amer >60 >60 mL/min   GFR calc Af Amer >60 >60 mL/min   Anion gap 13 5 - 15  Urinalysis, Routine w reflex microscopic     Status: Abnormal   Collection Time: 05/01/18  8:21 AM  Result Value Ref Range   Color, Urine YELLOW YELLOW   APPearance CLEAR CLEAR   Specific Gravity, Urine 1.026 1.005 - 1.030   pH 6.0 5.0 - 8.0   Glucose, UA >=500 (A) NEGATIVE mg/dL   Hgb urine dipstick NEGATIVE NEGATIVE   Bilirubin Urine NEGATIVE NEGATIVE   Ketones, ur  5 (A) NEGATIVE mg/dL   Protein, ur 30 (A) NEGATIVE mg/dL   Nitrite NEGATIVE NEGATIVE   Leukocytes,Ua NEGATIVE NEGATIVE  RBC / HPF 0-5 0 - 5 RBC/hpf   WBC, UA 0-5 0 - 5 WBC/hpf   Bacteria, UA RARE (A) NONE SEEN  Lactic acid, plasma     Status: None   Collection Time: 05/01/18  8:21 AM  Result Value Ref Range   Lactic Acid, Venous 1.1 0.5 - 1.9 mmol/L  CBG monitoring, ED     Status: Abnormal   Collection Time: 05/01/18  8:24 AM  Result Value Ref Range   Glucose-Capillary 438 (H) 70 - 99 mg/dL  POC CBG, ED     Status: Abnormal   Collection Time: 05/01/18 10:46 AM  Result Value Ref Range   Glucose-Capillary 366 (H) 70 - 99 mg/dL   No results found for this or any previous visit (from the past 240 hour(s)).  Renal Function: Recent Labs    05/01/18 2683  CREATININE 1.18   Estimated Creatinine Clearance: 121.4 mL/min (by C-G formula based on SCr of 1.18 mg/dL).  Radiologic Imaging: Ct Pelvis W Contrast  Result Date: 05/01/2018 CLINICAL DATA:  Scrotal swelling and buttock abscess EXAM: CT PELVIS WITH CONTRAST TECHNIQUE: Multidetector CT imaging of the pelvis was performed using the standard protocol following the bolus administration of intravenous contrast. CONTRAST:  130m ISOVUE-300 IOPAMIDOL (ISOVUE-300) INJECTION 61% COMPARISON:  Prior ultrasound from 04/29/2018 FINDINGS: Urinary Tract:  Bladder is well distended. Bowel: Appendix is unremarkable. No obstructive or inflammatory changes of the bowel are seen. Vascular/Lymphatic: No pathologically enlarged lymph nodes. No significant vascular abnormality seen. Reproductive: Prostate is within normal limits. Considerable scrotal swelling and edema is noted similar to that seen on prior ultrasound examination. Air is noted along the medial aspect of the right buttock adjacent to the scrotum with air extending anteriorly into the base of the scrotum posteriorly. Testicles appear within normal limits. No other focal abnormality is noted.  Other:  None. Musculoskeletal: No suspicious bone lesions identified. IMPRESSION: Changes consistent with the known right buttock cellulitis, however there is extension of inflammatory change and mottled gas anteriorly towards the base of the scrotum. In total this area measures approximately 10 x 3.3 cm in greatest AP and craniocaudad projection. No discrete fluid collection is identified. Electronically Signed   By: MInez CatalinaM.D.   On: 05/01/2018 10:00    I independently reviewed the above imaging studies.  Impression/Recommendation: The patient is a 28year old male with poorly controlled DM, obesity and a draining right gluteal wound with worsening perineal/scrotal pain and induration concerning for inadequately drained abscess   -Continue Vanc and Zosyn -Tight blood glucose control -Will tentatively plan for scrotal/perinal I&D tomorrow AM under anesthesia  CEllison Hughs MD [facility] 05/01/2018, 12:05 PM

## 2018-05-01 NOTE — H&P (Signed)
[facility] History and Physical   Patient: Willie Spears UJW:[id number]   PCP: Marcial Pacas, DO DOB: 1990/10/23   DOA: 05/01/2018   DOS: 05/01/2018   DOS: the patient was seen and examined on 05/01/2018  Patient coming from: The patient is coming from home.  Chief Complaint: Worsening pain at the abscess site  HPI: Willie Spears is a 28 y.o. male with Past medical history of type 2 diabetes mellitus, uncontrolled. Patient presents with complaints of worsening scrotal pain. He was recently seen in the ER on 04/29/2018 for a gluteal abscess. At this abscess was drained and packed by ER physician and patient was discharged home on oral clindamycin and Norco. Patient reports that his pain was not controlled he was taking clindamycin as reported and he came to the ER for regular follow-up as recommended. Patient denies any fever or chills but reports that his pain has been worsening and he has been having difficulty walking or even difficulty standing. No nausea no vomiting reported. No diarrhea reported. Patient mentions that for his diabetes he used to take Invokana in the past but ran out of that medicine due to its cost and patient was transitioned to metformin.  Patient ran out of this medicine as well.  Patient's mother was prescribed Invokana which he was taking for last 2 months until a month ago he ran out of that medicine as well and does not take any medicine for his diabetes since last 4weeks. Patient reports 2 weeks history of worsening scrotal-perirectal abscess which he tried to pop on his own and was successful but then started having more pain.  ED Course: Due to worsening pain a CT pelvis was performed which was positive for soft tissue density as well as air tracking in the perineal region.  Patient was referred for admission for cellulitis.  I requested urology consultation.  At his baseline ambulates without any assistance And is independent for most of his ADL; manages his  medication on his own.  Review of Systems: as mentioned in the history of present illness.  All other systems reviewed and are negative.  Past Medical History:  Diagnosis Date  . Diabetes mellitus without complication ([place])    History reviewed. No pertinent surgical history. Social History:  reports that he has never smoked. He has never used smokeless tobacco. He reports current drug use. Drug: Marijuana. He reports that he does not drink alcohol.  No Known Allergies   Family History  Problem Relation Age of Onset  . Diabetes Mother   . Diabetes Father      Prior to Admission medications   Medication Sig Start Date End Date Taking? Authorizing Provider  BAYER CONTOUR NEXT TEST test strip CHECK FASTING BLOOD SUGAR ONCE DAILY. DIAGNOSIS TYPE 2 DIABETES ICD 10 E11.8 05/22/15  Yes Hommel, [name], DO  Blood Glucose Monitoring Suppl (BAYER CONTOUR NEXT MONITOR) w/Device KIT Check fasting blood sugar once daily. Diagnosis Type 2 Diabetes ICD 10 E11.8 04/10/15  Yes Hommel, [name], DO  clindamycin (CLEOCIN) 300 MG capsule Take 1 capsule (300 mg total) by mouth 4 (four) times daily. X 7 days 04/29/18  Yes Molpus, John, MD  HYDROcodone-acetaminophen (NORCO) 5-325 MG tablet Take 1 tablet by mouth every 4 (four) hours as needed (for pain). 04/29/18  Yes Molpus, John, MD  Lancets 30G MISC Check fasting blood sugar once daily. Diagnosis Type 2 Diabetes ICD 10 E11.8 04/10/15  Yes Hommel, [name], DO  metFORMIN (GLUCOPHAGE) 1000 MG tablet Take 1,000 mg  by mouth 2 (two) times daily with a meal.   Yes [provider]    Physical Exam: Vitals:   05/01/18 0938 05/01/18 1152 05/01/18 1332 05/01/18 1403  BP: (!) 175/90 (!) 151/88 (!) 165/93   Pulse: 90 86 90   Resp: _0 Temp:  97.9 F (36.6 C) 98.2 F (36.8 C)   TempSrc:  Oral Oral   SpO2: 98% 99% 98%   Weight:    113.4 kg  Height:    5' 10.98" (1.803 m)    General: Alert, Awake and Oriented to Time, Place and Person. Appear in moderate  distress, affect appropriate Eyes: PERRL, Conjunctiva normal ENT: Oral Mucosa clear moist. Neck: no JVD, no Abnormal Mass Or lumps Cardiovascular: S1 and S2 Present, no Murmur, Peripheral Pulses Present Respiratory: normal respiratory effort, Bilateral Air entry equal and Decreased, no use of accessory muscle, Clear to Auscultation, no Crackles, no wheezes Abdomen: Bowel Sound present, Soft and no tenderness, no hernia Skin: no redness, no Rash, perineal induration Extremities: no Pedal edema, no calf tenderness Neurologic: Grossly no focal neuro deficit. Bilaterally Equal motor strength  Labs on Admission:  CBC: Recent Labs  Lab 05/01/18 0821  WBC 20.4*  NEUTROABS 16.4*  HGB 13.9  HCT 44.6  MCV 73.7*  PLT 976*   Basic Metabolic Panel: Recent Labs  Lab 05/01/18 0821 05/01/18 1444  NA 132*  --   K 4.0  --   CL 94*  --   CO2 25  --   GLUCOSE 447* 416*  BUN 15  --   CREATININE 1.18  --   CALCIUM 8.8*  --    GFR: Estimated Creatinine Clearance: 121.4 mL/min (by C-G formula based on SCr of 1.18 mg/dL). Liver Function Tests: Recent Labs  Lab 05/01/18 0821  AST 13*  ALT 13  ALKPHOS 140*  BILITOT 0.5  PROT 8.0  ALBUMIN 3.5   No results for input(s): LIPASE, AMYLASE in the last 168 hours. No results for input(s): AMMONIA in the last 168 hours. Coagulation Profile: No results for input(s): INR, PROTIME in the last 168 hours. Cardiac Enzymes: No results for input(s): CKTOTAL, CKMB, CKMBINDEX, TROPONINI in the last 168 hours. BNP (last 3 results) No results for input(s): PROBNP in the last 8760 hours. HbA1C: No results for input(s): HGBA1C in the last 72 hours. CBG: Recent Labs  Lab 05/01/18 0824 05/01/18 1046 05/01/18 1302  GLUCAP 438* 366* 410*   Lipid Profile: No results for input(s): CHOL, HDL, LDLCALC, TRIG, CHOLHDL, LDLDIRECT in the last 72 hours. Thyroid Function Tests: No results for input(s): TSH, T4TOTAL, FREET4, T3FREE, THYROIDAB in the last 72  hours. Anemia Panel: No results for input(s): VITAMINB12, FOLATE, FERRITIN, TIBC, IRON, RETICCTPCT in the last 72 hours. Urine analysis:    Component Value Date/Time   COLORURINE YELLOW 05/01/2018 0821   APPEARANCEUR CLEAR 05/01/2018 0821   LABSPEC 1.026 05/01/2018 0821   PHURINE 6.0 05/01/2018 0821   GLUCOSEU >=500 (A) 05/01/2018 0821   HGBUR NEGATIVE 05/01/2018 0821   BILIRUBINUR NEGATIVE 05/01/2018 0821   KETONESUR 5 (A) 05/01/2018 0821   PROTEINUR 30 (A) 05/01/2018 0821   NITRITE NEGATIVE 05/01/2018 0821   LEUKOCYTESUR NEGATIVE 05/01/2018 0821    Radiological Exams on Admission: Ct Pelvis W Contrast  Result Date: 05/01/2018 CLINICAL DATA:  Scrotal swelling and buttock abscess EXAM: CT PELVIS WITH CONTRAST TECHNIQUE: Multidetector CT imaging of the pelvis was performed using the standard protocol following the bolus administration of intravenous contrast. CONTRAST:  159m ISOVUE-300 IOPAMIDOL (ISOVUE-300) INJECTION 61% COMPARISON:  Prior ultrasound from 04/29/2018 FINDINGS: Urinary Tract:  Bladder is well distended. Bowel: Appendix is unremarkable. No obstructive or inflammatory changes of the bowel are seen. Vascular/Lymphatic: No pathologically enlarged lymph nodes. No significant vascular abnormality seen. Reproductive: Prostate is within normal limits. Considerable scrotal swelling and edema is noted similar to that seen on prior ultrasound examination. Air is noted along the medial aspect of the right buttock adjacent to the scrotum with air extending anteriorly into the base of the scrotum posteriorly. Testicles appear within normal limits. No other focal abnormality is noted. Other:  None. Musculoskeletal: No suspicious bone lesions identified. IMPRESSION: Changes consistent with the known right buttock cellulitis, however there is extension of inflammatory change and mottled gas anteriorly towards the base of the scrotum. In total this area measures approximately 10 x 3.3 cm in  greatest AP and craniocaudad projection. No discrete fluid collection is identified. Electronically Signed   By: MInez CatalinaM.D.   On: 05/01/2018 10:00   Assessment/Plan 1.  Scrotal abscess. Inadequate drainage. Appreciate urology assistance. CT scan is showing air tracking in the perineal region. We will start the patient on IV vancomycin and Zosyn. Follow-up on cultures. Urology is planning to take the patient to the OR for IND for further debridement. We will keep the patient n.p.o. after midnight. Continue with IV hydration.  2.  Type 2 diabetes mellitus. Uncontrolled with hyperglycemia. No complication. Noncompliant with medical regimen outpatient. Does not have any financial means to get any medication as well. We will check hemoglobin A1c. I will start the patient on moderate sliding scale as well as ACHS coverage. I would also start the patient on Lantus, although currently since the patient will remain n.p.o. we will only give 0.1 unit/kg dose. Case management diabetes coronary consulted.  Obesity. Body mass index is 34.88 kg/m.  Dietary consultation would be helpful.  Nutrition: Carb modified diet, n.p.o. after midnight DVT Prophylaxis: subcutaneous Heparin  Advance goals of care discussion: Full code  Consults: Urology  Family Communication: no family was present at bedside, at the time of interview.  Disposition: Admitted as inpatient, med-surge unit. Likely to be discharged home, in 3-4 days.  Author: PBerle Mull MD [facility] 05/01/2018  To reach On-call, see care teams to locate the attending and reach out to them via [email or web address] If 7PM-7AM, please contact night-coverage If you still have difficulty reaching the attending provider, please page the [facility](Director on Call) for [facility] on amion for assistance.

## 2018-05-01 NOTE — Progress Notes (Signed)
Finger stick blood sugar 410. STAT lab ordered to verify reading. Lab result 416. This value called to Dr. Allena Katz, orders to give insulin as ordered and continue to monitor

## 2018-05-01 NOTE — ED Provider Notes (Signed)
[facility] DEPT Provider Note   CSN: [id number] Arrival date & time: 05/01/18  0711    History   Chief Complaint Chief Complaint  Patient presents with  . scrotum swelling and pain    HPI Willie ARO is a 28 y.o. male.     HPI  28 year old male with history of diabetes presents with concern for right scrotal and inguinal swelling and pain, after presenting 2 days ago with incision and drainage performed of the right buttock.  Patient reports that for approximately 4 to 5 days, he has had increasing swelling, severe pain to his right buttock and right scrotum.  Reports he had a baseball sized area of swelling that was drained 2 days ago in the emergency department with significant amount of pus expressed, improvement of that area, however he has had continued and worsening swelling of the surrounding area extending into the right scrotum.  Denies fevers, abdominal pain, nausea, vomiting.  Has no history of prior abscess.  Past Medical History:  Diagnosis Date  . Diabetes mellitus without complication [facility])     Patient Active Problem List   Diagnosis Date Noted  . Type 2 diabetes mellitus ([place]) 03/29/2015  . Vitamin D deficiency 03/29/2015  . Fatigue 03/28/2015  . Family history of diabetes mellitus 03/28/2015    History reviewed. No pertinent surgical history.      Home Medications    Prior to Admission medications   Medication Sig Start Date End Date Taking? Authorizing Provider  BAYER CONTOUR NEXT TEST test strip CHECK FASTING BLOOD SUGAR ONCE DAILY. DIAGNOSIS TYPE 2 DIABETES ICD 10 E11.8 05/22/15  Yes Hommel, [name], DO  Blood Glucose Monitoring Suppl (BAYER CONTOUR NEXT MONITOR) w/Device KIT Check fasting blood sugar once daily. Diagnosis Type 2 Diabetes ICD 10 E11.8 04/10/15  Yes Hommel, [name], DO  clindamycin (CLEOCIN) 300 MG capsule Take 1 capsule (300 mg total) by mouth 4 (four) times daily. X 7 days 04/29/18  Yes Molpus, John, MD    HYDROcodone-acetaminophen (NORCO) 5-325 MG tablet Take 1 tablet by mouth every 4 (four) hours as needed (for pain). 04/29/18  Yes Molpus, John, MD  Lancets 30G MISC Check fasting blood sugar once daily. Diagnosis Type 2 Diabetes ICD 10 E11.8 04/10/15  Yes Hommel, [name], DO  metFORMIN (GLUCOPHAGE) 1000 MG tablet Take 1,000 mg by mouth 2 (two) times daily with a meal.   Yes [provider]    Family History Family History  Problem Relation Age of Onset  . Diabetes Mother   . Diabetes Father     Social History Social History   Tobacco Use  . Smoking status: Never Smoker  . Smokeless tobacco: Never Used  Substance Use Topics  . Alcohol use: No  . Drug use: Yes    Types: Marijuana     Allergies   Patient has no known allergies.   Review of Systems Review of Systems  Constitutional: Negative for fever.  Respiratory: Negative for shortness of breath.   Gastrointestinal: Negative for abdominal pain, diarrhea, nausea and vomiting.  Genitourinary: Positive for scrotal swelling and testicular pain. Negative for dysuria.  Musculoskeletal: Negative for back pain.  Skin: Positive for rash.  Neurological: Negative for headaches.     Physical Exam Updated Vital Signs BP (!) 175/90 (BP Location: Left Arm)   Pulse 90   Temp 97.6 F (36.4 C) (Oral)   Resp 19   SpO2 98%   Physical Exam Vitals signs and nursing note reviewed.  Constitutional:  General: He is not in acute distress.    Appearance: He is well-developed. He is not diaphoretic.  HENT:     Head: Normocephalic and atraumatic.  Eyes:     Conjunctiva/sclera: Conjunctivae normal.  Neck:     Musculoskeletal: Normal range of motion.  Cardiovascular:     Rate and Rhythm: Normal rate and regular rhythm.  Pulmonary:     Effort: Pulmonary effort is normal. No respiratory distress.  Abdominal:     General: There is no distension.     Palpations: Abdomen is soft.     Tenderness: There is no abdominal  tenderness. There is no guarding.  Genitourinary:    Comments: Tenderness, swelling, erythema perineum from right buttock extending towards scrotum and severe pain 8cm induration to scrotum No crepitus  Skin:    General: Skin is warm and dry.  Neurological:     Mental Status: He is alert and oriented to person, place, and time.      ED Treatments / Results  Labs (all labs ordered are listed, but only abnormal results are displayed) Labs Reviewed  CBC WITH DIFFERENTIAL/PLATELET - Abnormal; Notable for the following components:      Result Value   WBC 20.4 (*)    RBC 6.05 (*)    MCV 73.7 (*)    MCH 23.0 (*)    Platelets 489 (*)    Neutro Abs 16.4 (*)    Monocytes Absolute 1.6 (*)    Abs Immature Granulocytes 0.19 (*)    All other components within normal limits  COMPREHENSIVE METABOLIC PANEL - Abnormal; Notable for the following components:   Sodium 132 (*)    Chloride 94 (*)    Glucose, Bld 447 (*)    Calcium 8.8 (*)    AST 13 (*)    Alkaline Phosphatase 140 (*)    All other components within normal limits  URINALYSIS, ROUTINE W REFLEX MICROSCOPIC - Abnormal; Notable for the following components:   Glucose, UA >=500 (*)    Ketones, ur 5 (*)    Protein, ur 30 (*)    Bacteria, UA RARE (*)    All other components within normal limits  CBG MONITORING, ED - Abnormal; Notable for the following components:   Glucose-Capillary 438 (*)    All other components within normal limits  CBG MONITORING, ED - Abnormal; Notable for the following components:   Glucose-Capillary 366 (*)    All other components within normal limits  LACTIC ACID, PLASMA    EKG None  Radiology Ct Pelvis W Contrast  Result Date: 05/01/2018 CLINICAL DATA:  Scrotal swelling and buttock abscess EXAM: CT PELVIS WITH CONTRAST TECHNIQUE: Multidetector CT imaging of the pelvis was performed using the standard protocol following the bolus administration of intravenous contrast. CONTRAST:  149m ISOVUE-300  IOPAMIDOL (ISOVUE-300) INJECTION 61% COMPARISON:  Prior ultrasound from 04/29/2018 FINDINGS: Urinary Tract:  Bladder is well distended. Bowel: Appendix is unremarkable. No obstructive or inflammatory changes of the bowel are seen. Vascular/Lymphatic: No pathologically enlarged lymph nodes. No significant vascular abnormality seen. Reproductive: Prostate is within normal limits. Considerable scrotal swelling and edema is noted similar to that seen on prior ultrasound examination. Air is noted along the medial aspect of the right buttock adjacent to the scrotum with air extending anteriorly into the base of the scrotum posteriorly. Testicles appear within normal limits. No other focal abnormality is noted. Other:  None. Musculoskeletal: No suspicious bone lesions identified. IMPRESSION: Changes consistent with the known right buttock cellulitis, however there  is extension of inflammatory change and mottled gas anteriorly towards the base of the scrotum. In total this area measures approximately 10 x 3.3 cm in greatest AP and craniocaudad projection. No discrete fluid collection is identified. Electronically Signed   By: Inez Catalina M.D.   On: 05/01/2018 10:00    Procedures .Critical Care Performed by: Gareth Morgan, MD Authorized by: Gareth Morgan, MD   Critical care provider statement:    Critical care time (minutes):  30   Critical care was necessary to treat or prevent imminent or life-threatening deterioration of the following conditions:  Sepsis   Critical care was time spent personally by me on the following activities:  Discussions with consultants, examination of patient, ordering and performing treatments and interventions, ordering and review of laboratory studies, ordering and review of radiographic studies and review of old charts   (including critical care time)  Medications Ordered in ED Medications  iopamidol (ISOVUE-300) 61 % injection (has no administration in time range)    sodium chloride (PF) 0.9 % injection (has no administration in time range)  piperacillin-tazobactam (ZOSYN) IVPB 3.375 g (3.375 g Intravenous New Bag/Given 05/01/18 1054)  vancomycin (VANCOCIN) 2,000 mg in sodium chloride 0.9 % 500 mL IVPB (has no administration in time range)  sodium chloride 0.9 % bolus 1,000 mL (0 mLs Intravenous Stopped 05/01/18 1039)  morphine 4 MG/ML injection 4 mg (4 mg Intravenous Given 05/01/18 0857)  iopamidol (ISOVUE-300) 61 % injection 100 mL (100 mLs Intravenous Contrast Given 05/01/18 0926)     Initial Impression / Assessment and Plan / ED Course  I have reviewed the triage vital signs and the nursing notes.  Pertinent labs & imaging results that were available during my care of the patient were reviewed by me and considered in my medical decision making (see chart for details).       28 year old male with history of diabetes presents with concern for right scrotal and inguinal swelling and pain, after presenting 2 days ago with incision and drainage performed of the right buttock.  Labs obtained show no sign of DKA.  CT pelvis obtained showing right buttock cellulitis, mottled gas anteriorly towards the base of the scrotum measuring 10x3.3cm. Area of prior incision and drainage was towards the base of the scrotum and suspect that gas seen on exam is due to recent instrumentation and have low suspicion at this time for Fournier's gangrene.  He is hyperglycemic, with leukocytosis and has failed outpatient treatment of cellulitis with clindamycin.  Will admit to hospitalist for further care.   Called Urology and spoke to Dr. Lovena Neighbours who will come and evaluate the patient.      Final Clinical Impressions(s) / ED Diagnoses   Final diagnoses:  Cellulitis of perineum  Leukocytosis, unspecified type  Hyperglycemia    ED Discharge Orders    None       Gareth Morgan, MD 05/01/18 1123

## 2018-05-02 ENCOUNTER — Encounter (HOSPITAL_COMMUNITY): Payer: Self-pay | Admitting: Certified Registered"

## 2018-05-02 ENCOUNTER — Encounter (HOSPITAL_COMMUNITY)
Admission: EM | Disposition: A | Discharge status: Home / Self Care | Payer: Self-pay | Source: Home / Self Care | Attending: Internal Medicine

## 2018-05-02 ENCOUNTER — Inpatient Hospital Stay (HOSPITAL_COMMUNITY): Payer: Self-pay | Admitting: Registered Nurse

## 2018-05-02 DIAGNOSIS — N492 Inflammatory disorders of scrotum: Secondary | ICD-10-CM

## 2018-05-02 HISTORY — PX: IRRIGATION AND DEBRIDEMENT ABSCESS: SHX5252

## 2018-05-02 LAB — COMPREHENSIVE METABOLIC PANEL
ALT: 35 U/L (ref 0–44)
AST: 32 U/L (ref 15–41)
Albumin: 3 g/dL — ABNORMAL LOW (ref 3.5–5.0)
Alkaline Phosphatase: 190 U/L — ABNORMAL HIGH (ref 38–126)
Anion gap: 11 (ref 5–15)
BUN: 11 mg/dL (ref 6–20)
CALCIUM: 8.7 mg/dL — AB (ref 8.9–10.3)
CO2: 24 mmol/L (ref 22–32)
Chloride: 102 mmol/L (ref 98–111)
Creatinine, Ser: 1.1 mg/dL (ref 0.61–1.24)
GFR calc Af Amer: >60 mL/min (ref >60)
Glucose, Bld: 197 mg/dL — ABNORMAL HIGH (ref 70–99)
Potassium: 3.6 mmol/L (ref 3.5–5.1)
Sodium: 137 mmol/L (ref 135–145)
Total Bilirubin: 0.7 mg/dL (ref 0.3–1.2)
Total Protein: 7.7 g/dL (ref 6.5–8.1)

## 2018-05-02 LAB — GLUCOSE, CAPILLARY
GLUCOSE-CAPILLARY: 195 mg/dL — AB (ref 70–99)
GLUCOSE-CAPILLARY: 202 mg/dL — AB (ref 70–99)
Glucose-Capillary: 180 mg/dL — ABNORMAL HIGH (ref 70–99)
Glucose-Capillary: 221 mg/dL — ABNORMAL HIGH (ref 70–99)
Glucose-Capillary: 214 mg/dL — ABNORMAL HIGH (ref 70–99)

## 2018-05-02 LAB — CBC
HCT: 41.2 % (ref 39.0–52.0)
Hemoglobin: 13.1 g/dL (ref 13.0–17.0)
MCH: 23.5 pg — ABNORMAL LOW (ref 26.0–34.0)
MCHC: 31.8 g/dL (ref 30.0–36.0)
MCV: 73.8 fL — ABNORMAL LOW (ref 80.0–100.0)
Platelets: 487 10*3/uL — ABNORMAL HIGH (ref 150–400)
RBC: 5.58 MIL/uL (ref 4.22–5.81)
RDW: 13.3 % (ref 11.5–15.5)
WBC: 18.2 10*3/uL — ABNORMAL HIGH (ref 4.0–10.5)
nRBC: 0 % (ref 0.0–0.2)

## 2018-05-02 LAB — HEMOGLOBIN A1C
Hgb A1c MFr Bld: 12.2 % — ABNORMAL HIGH (ref 4.8–5.6)
Mean Plasma Glucose: 303.44 mg/dL

## 2018-05-02 LAB — HIV ANTIBODY (ROUTINE TESTING W REFLEX): HIV Screen 4th Generation wRfx: NONREACTIVE

## 2018-05-02 SURGERY — IRRIGATION AND DEBRIDEMENT ABSCESS
Anesthesia: General | Site: Scrotum

## 2018-05-02 MED ORDER — LACTATED RINGERS IV SOLN
INTRAVENOUS | Status: DC | PRN
  Administered 2018-05-02: 08:00:00 via INTRAVENOUS

## 2018-05-02 MED ORDER — LABETALOL HCL 5 MG/ML IV SOLN
INTRAVENOUS | Status: DC | PRN
  Administered 2018-05-02: 2.5 mg via INTRAVENOUS

## 2018-05-02 MED ORDER — MIDAZOLAM HCL 2 MG/2ML IJ SOLN
1.0000 mg | Freq: Once | INTRAMUSCULAR | Status: AC
  Administered 2018-05-02: 1 mg via INTRAVENOUS

## 2018-05-02 MED ORDER — HYDROMORPHONE HCL 1 MG/ML IJ SOLN
0.2500 mg | INTRAMUSCULAR | Status: DC | PRN
  Administered 2018-05-02 (×2): 0.5 mg via INTRAVENOUS

## 2018-05-02 MED ORDER — FENTANYL CITRATE (PF) 250 MCG/5ML IJ SOLN
INTRAMUSCULAR | Status: AC
  Filled 2018-05-02: qty 5

## 2018-05-02 MED ORDER — MIDAZOLAM HCL 2 MG/2ML IJ SOLN
INTRAMUSCULAR | Status: AC
  Filled 2018-05-02: qty 2

## 2018-05-02 MED ORDER — HYDROMORPHONE HCL 1 MG/ML IJ SOLN
INTRAMUSCULAR | Status: AC
  Filled 2018-05-02: qty 2

## 2018-05-02 MED ORDER — ONDANSETRON HCL 4 MG/2ML IJ SOLN
INTRAMUSCULAR | Status: DC | PRN
  Administered 2018-05-02 (×2): 4 mg via INTRAVENOUS

## 2018-05-02 MED ORDER — PROPOFOL 10 MG/ML IV BOLUS
INTRAVENOUS | Status: DC | PRN
  Administered 2018-05-02: 200 mg via INTRAVENOUS

## 2018-05-02 MED ORDER — LIDOCAINE 2% (20 MG/ML) 5 ML SYRINGE
INTRAMUSCULAR | Status: DC | PRN
  Administered 2018-05-02: 100 mg via INTRAVENOUS

## 2018-05-02 MED ORDER — LABETALOL HCL 5 MG/ML IV SOLN
5.0000 mg | INTRAVENOUS | Status: DC | PRN
  Administered 2018-05-02 (×2): 5 mg via INTRAVENOUS

## 2018-05-02 MED ORDER — SODIUM CHLORIDE 0.9 % IV SOLN
INTRAVENOUS | Status: DC | PRN
  Administered 2018-05-02: 250 mL via INTRAVENOUS

## 2018-05-02 MED ORDER — ONDANSETRON HCL 4 MG/2ML IJ SOLN: 4.0000 mg | Freq: Once | INTRAMUSCULAR | Status: DC | PRN

## 2018-05-02 MED ORDER — ARTIFICIAL TEARS OPHTHALMIC OINT
TOPICAL_OINTMENT | OPHTHALMIC | Status: AC
  Filled 2018-05-02: qty 3.5

## 2018-05-02 MED ORDER — LIVING WELL WITH DIABETES BOOK
Freq: Once | Status: AC
  Administered 2018-05-02: 18:00:00

## 2018-05-02 MED ORDER — PHENYLEPHRINE HCL 10 MG/ML IJ SOLN
INTRAMUSCULAR | Status: AC
  Filled 2018-05-02: qty 1

## 2018-05-02 MED ORDER — BUPIVACAINE HCL (PF) 0.5 % IJ SOLN
INTRAMUSCULAR | Status: AC
  Filled 2018-05-02: qty 30

## 2018-05-02 MED ORDER — PROPOFOL 10 MG/ML IV BOLUS
INTRAVENOUS | Status: AC
  Filled 2018-05-02: qty 20

## 2018-05-02 MED ORDER — 0.9 % SODIUM CHLORIDE (POUR BTL) OPTIME
TOPICAL | Status: DC | PRN
  Administered 2018-05-02: 1000 mL

## 2018-05-02 MED ORDER — INSULIN STARTER KIT- SYRINGES (ENGLISH)
1.0000 | Freq: Once | Status: AC
  Administered 2018-05-02: 1

## 2018-05-02 MED ORDER — MIDAZOLAM HCL 2 MG/2ML IJ SOLN
INTRAMUSCULAR | Status: DC | PRN
  Administered 2018-05-02: 2 mg via INTRAVENOUS

## 2018-05-02 MED ORDER — LABETALOL HCL 5 MG/ML IV SOLN
INTRAVENOUS | Status: AC
  Filled 2018-05-02: qty 4

## 2018-05-02 MED ORDER — FENTANYL CITRATE (PF) 250 MCG/5ML IJ SOLN
INTRAMUSCULAR | Status: DC | PRN
  Administered 2018-05-02 (×2): 100 ug via INTRAVENOUS
  Administered 2018-05-02 (×3): 50 ug via INTRAVENOUS
  Administered 2018-05-02: 100 ug via INTRAVENOUS
  Administered 2018-05-02: 50 ug via INTRAVENOUS

## 2018-05-02 MED ORDER — BUPIVACAINE HCL (PF) 0.5 % IJ SOLN
INTRAMUSCULAR | Status: DC | PRN
  Administered 2018-05-02: 18 mL

## 2018-05-02 MED ORDER — MEPERIDINE HCL 50 MG/ML IJ SOLN: 6.2500 mg | INTRAMUSCULAR | Status: DC | PRN

## 2018-05-02 SURGICAL SUPPLY — 43 items
APL SKNCLS STERI-STRIP NONHPOA (GAUZE/BANDAGES/DRESSINGS)
BENZOIN TINCTURE PRP APPL 2/3 (GAUZE/BANDAGES/DRESSINGS) IMPLANT
BLADE HEX COATED 2.75 (ELECTRODE) ×3 IMPLANT
BLADE SURG 15 STRL LF DISP TIS (BLADE) ×1 IMPLANT
BLADE SURG 15 STRL SS (BLADE) ×3
BLADE SURG SZ10 CARB STEEL (BLADE) ×3 IMPLANT
CLOSURE WOUND 1/2 X4 (GAUZE/BANDAGES/DRESSINGS)
COVER SURGICAL LIGHT HANDLE (MISCELLANEOUS) ×3 IMPLANT
COVER WAND RF STERILE (DRAPES) IMPLANT
DECANTER SPIKE VIAL GLASS SM (MISCELLANEOUS) ×3 IMPLANT
DRAIN PENROSE 18X1/4 LTX STRL (WOUND CARE) ×3 IMPLANT
DRAPE LAPAROSCOPIC ABDOMINAL (DRAPES) ×3 IMPLANT
DRAPE LAPAROTOMY T 102X78X121 (DRAPES) IMPLANT
DRAPE LAPAROTOMY TRNSV 102X78 (DRAPE) IMPLANT
DRAPE POUCH INSTRU U-SHP 10X18 (DRAPES) ×3 IMPLANT
DRAPE SHEET LG 3/4 BI-LAMINATE (DRAPES) IMPLANT
DRSG PAD ABDOMINAL 8X10 ST (GAUZE/BANDAGES/DRESSINGS) ×6 IMPLANT
ELECT PENCIL ROCKER SW 15FT (MISCELLANEOUS) ×3 IMPLANT
ELECT REM PT RETURN 15FT ADLT (MISCELLANEOUS) ×6 IMPLANT
EVACUATOR SILICONE 100CC (DRAIN) IMPLANT
GAUZE PACKING IODOFORM 1/2 (PACKING) ×3 IMPLANT
GAUZE SPONGE 4X4 12PLY STRL (GAUZE/BANDAGES/DRESSINGS) ×3 IMPLANT
GLOVE BIOGEL PI IND STRL 7.0 (GLOVE) ×1 IMPLANT
GLOVE BIOGEL PI INDICATOR 7.0 (GLOVE) ×2
GLOVE EUDERMIC 7 POWDERFREE (GLOVE) ×3 IMPLANT
GOWN STRL REUS W/TWL LRG LVL3 (GOWN DISPOSABLE) ×3 IMPLANT
GOWN STRL REUS W/TWL XL LVL3 (GOWN DISPOSABLE) ×6 IMPLANT
KIT BASIN OR (CUSTOM PROCEDURE TRAY) ×3 IMPLANT
NEEDLE HYPO 25X1 1.5 SAFETY (NEEDLE) ×3 IMPLANT
NS IRRIG 1000ML POUR BTL (IV SOLUTION) ×3 IMPLANT
PACK BASIC VI WITH GOWN DISP (CUSTOM PROCEDURE TRAY) IMPLANT
SOL PREP POV-IOD 4OZ 10% (MISCELLANEOUS) ×3 IMPLANT
SPONGE LAP 18X18 RF (DISPOSABLE) ×3 IMPLANT
SPONGE LAP 4X18 RFD (DISPOSABLE) IMPLANT
STAPLER VISISTAT 35W (STAPLE) IMPLANT
STRIP CLOSURE SKIN 1/2X4 (GAUZE/BANDAGES/DRESSINGS) IMPLANT
SUT ETHILON 2 0 PS N (SUTURE) ×12 IMPLANT
SUT MNCRL AB 4-0 PS2 18 (SUTURE) IMPLANT
SUT VIC AB 3-0 SH 18 (SUTURE) IMPLANT
SYR CONTROL 10ML LL (SYRINGE) ×3 IMPLANT
TOWEL OR 17X26 10 PK STRL BLUE (TOWEL DISPOSABLE) ×3 IMPLANT
WATER STERILE IRR 1000ML POUR (IV SOLUTION) ×3 IMPLANT
YANKAUER SUCT BULB TIP 10FT TU (MISCELLANEOUS) ×3 IMPLANT

## 2018-05-02 NOTE — Transfer of Care (Signed)
Immediate Anesthesia Transfer of Care Note  Patient: Willie Spears  Procedure(s) Performed: IRRIGATION AND DEBRIDEMENT ABSCESS scrotal (N/A Scrotum)  Patient Location: PACU  Anesthesia Type:General  Level of Consciousness: awake and alert   Airway & Oxygen Therapy: Patient Spontanous Breathing and Patient connected to face mask oxygen  Post-op Assessment: Report given to RN and Post -op Vital signs reviewed and stable  Post vital signs: Reviewed and stable  Last Vitals:  Vitals Value Taken Time  BP 187/133 05/02/2018  9:23 AM  Temp    Pulse 97 05/02/2018  9:26 AM  Resp 16 05/02/2018  9:26 AM  SpO2 100 % 05/02/2018  9:26 AM  Vitals shown include unvalidated device data.  Last Pain:  Vitals:   05/02/18 3299  TempSrc: Oral  PainSc:       Patients Stated Pain Goal: 2 (05/02/18 0305)  Complications: No apparent anesthesia complications

## 2018-05-02 NOTE — Anesthesia Procedure Notes (Signed)
Date/Time: 05/02/2018 9:13 AM Performed by: Minerva Ends, CRNA Oxygen Delivery Method: Simple face mask Placement Confirmation: positive ETCO2 and breath sounds checked- equal and bilateral Dental Injury: Teeth and Oropharynx as per pre-operative assessment

## 2018-05-02 NOTE — Op Note (Signed)
Operative Note  Preoperative diagnosis:  1.  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  History of right gluteal abscess 3.  Uncontrolled type 2 diabetes  Postoperative diagnosis: 1.  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  History of right gluteal abscess 3.  Uncontrolled type 2 diabetes  Procedure(s): 1.  Incision and drainage with debridement of scrotal and perineal abscess  Surgeon: Rhoderick Moody, MD  Assistants:  None  Anesthesia:  General  Complications:  None  EBL: 150 mL  Specimens: 1.  Tissue cultures  Drains/Catheters: 1.  None  Intraoperative findings:   Necrotic scrotal skin measuring approximately 5 x 5 cm with abscess extending from the base of the scrotum and into the peritoneum.  The total area of debridement was approximately 20 cm in length and 5 cm in width.  Indication:  Willie Spears is a 28 y.o. male with uncontrolled type 2 diabetes who initially presented on 04/29/2018 with a right gluteal abscess that was drained by the ER staff.  The patient presented back to the emergency room on 05/01/2018 with worsening scrotal and perineal pain and physical exam findings concerning for a worsening perineal/scrotal abscess with possible Fournier's gangrene.  The patient has been consented for the above procedures, voices understanding and wishes to proceed.  Description of procedure:  After informed consent was obtained, the patient was brought to the operating room and general LMA anesthesia was administered.  The patient was placed in the dorsolithotomy position and prepped and draped in usual sterile fashion.  A timeout was then performed.  Examination of the scrotum found an eschar measuring approximately 5 x 5 cm along the base of the scrotum.  Also indurated skin extending from the base of the scrotum and into the perineum immediately adjacent to the anus at the 11 o'clock position.  Incision into the scrotal skin revealed necrotic  subcutaneous tissue that was extensively debrided until healthy viable scrotal tissue was identified.  Wound cultures were obtained.  The debridement then extended down into the perineum, again identifying a pocket of purulence adjacent to his previously placed drained right gluteal abscess.  The total area of debridement was approximately 20 cm x 5 cm.  Hemostasis was then achieved.  The wound was then loosely reapproximated with 2-0 nylon suture in an interrupted fashion.  The wound was packed with iodoform gauze and dressed appropriately.  The patient tolerated the procedure well and was transferred to the postanesthesia in stable condition.  Plan: Continue broad-spectrum antibiotics as well as tight blood glucose control.  I will closely monitor his wound and plan for re-debridement as necessary.

## 2018-05-02 NOTE — Progress Notes (Signed)
Inpatient Diabetes Program Recommendations  AACE/ADA: New Consensus Statement on Inpatient Glycemic Control   Target Ranges:  Prepandial:   less than 140 mg/dL      Peak postprandial:   less than 180 mg/dL (1-2 hours)      Critically ill patients:  140 - 180 mg/dL   Results for Willie Spears, Willie Spears (MRN [id number]) as of 05/02/2018 13:23  Ref. Range 05/01/2018 08:24 05/01/2018 10:46 05/01/2018 13:02 05/01/2018 22:03 05/02/2018 06:34 05/02/2018 09:30 05/02/2018 11:15  Glucose-Capillary Latest Ref Range: 70 - 99 mg/dL 438 (H) 366 (H) 410 (H) 166 (H) 180 (H) 195 (H) 202 (H)   Review of Glycemic Control  Diabetes history: DM2 Outpatient Diabetes medications: None in 1 month (was taking his mother's Invokana) Current orders for Inpatient glycemic control: Lantus 12 units QHS, Novolog 0-15 units TID with meals, Novolog 0-5 units QHS, Novolog 4 units TID with meals  Inpatient Diabetes Program Recommendations:  HgbA1C: A1C 12.2% on 05/02/18 indicating an average glucose of 303 mg/dl over the past 2-3 months.   NOTE:Noted consult for Diabetes Coordinator. Diabetes Coordinator is not on campus over the weekend but available by pager from 8am to 5pm for questions or concerns. Per H&P, "patient mentions that for his diabetes he used to take Invokana in the past but ran out of that medicine due to its cost and patient was transitioned to metformin.  Patient ran out of this medicine as well.  Patient's mother was prescribed Invokana which he was taking for last 2 months until a month ago he ran out of that medicine as well and does not take any medicine for his diabetes since last 4weeks."   Ordered Living Well with DM book, RD consult for diet education, CM consult to assist with follow up and perhaps medication assistance, insulin starter kit, and patient education by bedside RNs. RNs will need to being to educate patient on DM and insulin. Diabetes Coordinator will see patient on 05/03/18.  Thanks, Barnie Alderman, RN, MSN,  CDE Diabetes Coordinator Inpatient Diabetes Program [phone] (Team Pager from 8am to 5pm)

## 2018-05-02 NOTE — Anesthesia Procedure Notes (Signed)
Procedure Name: LMA Insertion Date/Time: 05/02/2018 7:38 AM Performed by: Minerva Ends, CRNA Pre-anesthesia Checklist: Patient identified, Emergency Drugs available, Suction available and Patient being monitored Patient Re-evaluated:Patient Re-evaluated prior to induction Oxygen Delivery Method: Circle System Utilized Preoxygenation: Pre-oxygenation with 100% oxygen Induction Type: IV induction Ventilation: Mask ventilation without difficulty LMA: LMA inserted and LMA with gastric port inserted LMA Size: 4.0 Number of attempts: 1 Placement Confirmation: positive ETCO2 Tube secured with: Tape Dental Injury: Teeth and Oropharynx as per pre-operative assessment  Comments: Smooth IV induction Ossey--- LMA insertion AM CRNA--atraumatic teeth and mouth as preop -- bilat BS Ossey

## 2018-05-02 NOTE — Progress Notes (Signed)
PROGRESS NOTE    Willie AschoffSamuel E Spears  ZOX:[id number] DOB: 08/22/1990 DOA: 05/01/2018 PCP: [name], [name], DO   Brief Narrative: Patient is a 28 year old male with past medical history of diabetes mellitus type 2/uncontrolled who presents to the emergency department with complaints of worsening scrotal pain.  He was recently seen in the emergency department on 2/27 for a gluteal abscess which was drained and packed was discharged to home on oral antibiotics.  Patient reported that pain was not controlled.  Patient was also not taking any medications for his diabetes currently.  Reports 2-week history of worsening scrotal-perirectal abscess.  CT pelvis showed soft tissue density as well as air tracking in the perineal region.  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  Urology consulted and he underwent incision and drainage with debridement of the scrotal and perineal abscesses.  Assessment & Plan:   Active Problems:   Scrotal abscess  Fournier's gangrene/scrotal-perineal abscesses: Urology folllowing.  Underwent incision and drainage with debridement.  Currently hemodynamically stable.  Will follow blood cultures and aerobic/anaerobic cultures.  Currently on vancomycin and Zosyn which we will continue.  Continue IV hydration.  Leucocytosis: We will continue to monitor the trend.  Type 2 diabetes mellitus: Uncontrolled with hyperglycemia.  Not on medication at home.  Diabetic coordinator consulted.  Started on insulin here.  Hemoglobin A1c of 12.2.  He was previously just on metformin at home.  He might need insulin on discharge.  He needs insulin education.  Obesity: BMI of 35.         DVT prophylaxis: lovenox Code Status: Full Family Communication: None present at the bedside Disposition Plan: Home after urology clearance   Consultants: Urology  Procedures: Incision and drainage, debridement  Antimicrobials:  Anti-infectives (From admission, onward)   Start      Dose/Rate Route Frequency Ordered Stop   05/01/18 2359  vancomycin (VANCOCIN) 1,250 mg in sodium chloride 0.9 % 250 mL IVPB     1,250 mg 166.7 mL/hr over 90 Minutes Intravenous Every 12 hours 05/01/18 1350     05/01/18 1800  piperacillin-tazobactam (ZOSYN) IVPB 3.375 g     3.375 g 12.5 mL/hr over 240 Minutes Intravenous Every 8 hours 05/01/18 1344     05/01/18 1100  piperacillin-tazobactam (ZOSYN) IVPB 3.375 g     3.375 g 100 mL/hr over 30 Minutes Intravenous STAT 05/01/18 1045 05/01/18 1124   05/01/18 1100  vancomycin (VANCOCIN) 2,000 mg in sodium chloride 0.9 % 500 mL IVPB     2,000 mg 250 mL/hr over 120 Minutes Intravenous STAT 05/01/18 1046 05/01/18 1351      Subjective: Patient seen and examined the bedside this afternoon.  Pain looks well controlled.  Objective: Vitals:   05/02/18 1015 05/02/18 1030 05/02/18 1051 05/02/18 1101  BP: (!) 157/99 (!) 162/105 (!) 165/92   Pulse: 80 67 83 76  Resp: 12 11 15    Temp:  (!) 97.4 F (36.3 C) 98.4 F (36.9 C)   TempSrc:   Oral   SpO2: 100% 100% 100% 100%  Weight:      Height:        Intake/Output Summary (Last 24 hours) at 05/02/2018 1116 Last data filed at 05/02/2018 1100 Gross per 24 hour  Intake 3567.98 ml  Output 1100 ml  Net 2467.98 ml   Filed Weights   05/01/18 1403  Weight: 113.4 kg    Examination:  General exam: Not in distress,obese HEENT:PERRL,Oral mucosa moist, Ear/Nose normal on gross exam Respiratory system:  Bilateral equal air entry, normal vesicular breath sounds, no wheezes or crackles  Cardiovascular system: S1 & S2 heard, RRR. No JVD, murmurs, rubs, gallops or clicks. No pedal edema. Gastrointestinal system: Abdomen is nondistended, soft and nontender. No organomegaly or masses felt. Normal bowel sounds heard. Central nervous system: Alert and oriented. No focal neurological deficits. Extremities: No edema, no clubbing ,no cyanosis, distal peripheral pulses palpable. Skin/GU: Scrotum extremely tender to  touch.  Wrapped with dressings.  Data Reviewed: I have personally reviewed following labs and imaging studies  CBC: Recent Labs  Lab 05/01/18 0821 05/02/18 0425  WBC 20.4* 18.2*  NEUTROABS 16.4*  --   HGB 13.9 13.1  HCT 44.6 41.2  MCV 73.7* 73.8*  PLT 489* 487*   Basic Metabolic Panel: Recent Labs  Lab 05/01/18 0821 05/01/18 1444 05/02/18 0425  NA 132*  --  137  K 4.0  --  3.6  CL 94*  --  102  CO2 25  --  24  GLUCOSE 447* 416* 197*  BUN 15  --  11  CREATININE 1.18  --  1.10  CALCIUM 8.8*  --  8.7*   GFR: Estimated Creatinine Clearance: 130.3 mL/min (by C-G formula based on SCr of 1.1 mg/dL). Liver Function Tests: Recent Labs  Lab 05/01/18 0821 05/02/18 0425  AST 13* 32  ALT 13 35  ALKPHOS 140* 190*  BILITOT 0.5 0.7  PROT 8.0 7.7  ALBUMIN 3.5 3.0*   No results for input(s): LIPASE, AMYLASE in the last 168 hours. No results for input(s): AMMONIA in the last 168 hours. Coagulation Profile: No results for input(s): INR, PROTIME in the last 168 hours. Cardiac Enzymes: No results for input(s): CKTOTAL, CKMB, CKMBINDEX, TROPONINI in the last 168 hours. BNP (last 3 results) No results for input(s): PROBNP in the last 8760 hours. HbA1C: Recent Labs    05/01/18 0821 05/02/18 0425  HGBA1C 12.4* 12.2*   CBG: Recent Labs  Lab 05/01/18 1046 05/01/18 1302 05/01/18 2203 05/02/18 0634 05/02/18 0930  GLUCAP 366* 410* 166* 180* 195*   Lipid Profile: No results for input(s): CHOL, HDL, LDLCALC, TRIG, CHOLHDL, LDLDIRECT in the last 72 hours. Thyroid Function Tests: No results for input(s): TSH, T4TOTAL, FREET4, T3FREE, THYROIDAB in the last 72 hours. Anemia Panel: No results for input(s): VITAMINB12, FOLATE, FERRITIN, TIBC, IRON, RETICCTPCT in the last 72 hours. Sepsis Labs: Recent Labs  Lab 05/01/18 4695  LATICACIDVEN 1.1    Recent Results (from the past 240 hour(s))  Surgical pcr screen     Status: None   Collection Time: 05/01/18  8:09 PM  Result  Value Ref Range Status   MRSA, PCR NEGATIVE NEGATIVE Final   Staphylococcus aureus NEGATIVE NEGATIVE Final    Comment: (NOTE) The Xpert SA Assay (FDA approved for NASAL specimens in patients 56 years of age and older), is one component of a comprehensive surveillance program. It is not intended to diagnose infection nor to guide or monitor treatment. Performed at [facility], [address] [place], [place] [address]          Radiology Studies: Ct Pelvis W Contrast  Result Date: 05/01/2018 CLINICAL DATA:  Scrotal swelling and buttock abscess EXAM: CT PELVIS WITH CONTRAST TECHNIQUE: Multidetector CT imaging of the pelvis was performed using the standard protocol following the bolus administration of intravenous contrast. CONTRAST:  ISOVUE-300 IOPAMIDOL (ISOVUE-300) INJECTION 61% COMPARISON:  Prior ultrasound from 04/29/2018 FINDINGS: Urinary Tract:  Bladder is well distended. Bowel: Appendix is unremarkable. No obstructive  or inflammatory changes of the bowel are seen. Vascular/Lymphatic: No pathologically enlarged lymph nodes. No significant vascular abnormality seen. Reproductive: Prostate is within normal limits. Considerable scrotal swelling and edema is noted similar to that seen on prior ultrasound examination. Air is noted along the medial aspect of the right buttock adjacent to the scrotum with air extending anteriorly into the base of the scrotum posteriorly. Testicles appear within normal limits. No other focal abnormality is noted. Other:  None. Musculoskeletal: No suspicious bone lesions identified. IMPRESSION: Changes consistent with the known right buttock cellulitis, however there is extension of inflammatory change and mottled gas anteriorly towards the base of the scrotum. In total this area measures approximately 10 x 3.3 cm in greatest AP and craniocaudad projection. No discrete fluid collection is identified. Electronically Signed   By: Alcide Clever  M.D.   On: 05/01/2018 10:00        Scheduled Meds: . docusate sodium  100 mg Oral BID  . enoxaparin (LOVENOX) injection  55 mg Subcutaneous Q24H  . Influenza vac split quadrivalent PF  0.5 mL Intramuscular Tomorrow-1000  . insulin aspart  0-15 Units Subcutaneous TID WC  . insulin aspart  0-5 Units Subcutaneous QHS  . insulin aspart  4 Units Subcutaneous TID WC  . insulin glargine  12 Units Subcutaneous QHS  . labetalol      . midazolam      . polyethylene glycol  17 g Oral Daily   Continuous Infusions: . sodium chloride 100 mL/hr at 05/02/18 1111  . piperacillin-tazobactam (ZOSYN)  IV Stopped (05/02/18 0701)  . vancomycin Stopped (05/02/18 0215)     LOS: 1 day    Time spent: 35 mins.More than 50% of that time was spent in counseling and/or coordination of care.      Burnadette Pop, MD [facility] Pager [phone]  If 7PM-7AM, please contact night-coverage [email or web address] Password [email or web address] 05/02/2018, 11:16 AM

## 2018-05-02 NOTE — Anesthesia Preprocedure Evaluation (Addendum)
Anesthesia Evaluation  Patient identified by MRN, date of birth, ID band Patient awake    Reviewed: Allergy & Precautions, NPO status , Patient's Chart, lab work & pertinent test results  Airway Mallampati: II  TM Distance: >3 FB Neck ROM: Full    Dental  (+) Dental Advisory Given, Chipped   Pulmonary    Pulmonary exam normal        Cardiovascular Normal cardiovascular exam     Neuro/Psych    GI/Hepatic   Endo/Other  diabetes, Type 2, Insulin Dependent  Renal/GU      Musculoskeletal   Abdominal   Peds  Hematology   Anesthesia Other Findings   Reproductive/Obstetrics                            Anesthesia Physical Anesthesia Plan  ASA: III  Anesthesia Plan: General   Post-op Pain Management:    Induction: Intravenous  PONV Risk Score and Plan: 2 and Ondansetron and Treatment may vary due to age or medical condition  Airway Management Planned: LMA  Additional Equipment:   Intra-op Plan:   Post-operative Plan: Extubation in OR  Informed Consent: I have reviewed the patients History and Physical, chart, labs and discussed the procedure including the risks, benefits and alternatives for the proposed anesthesia with the patient or authorized representative who has indicated his/her understanding and acceptance.       Plan Discussed with: CRNA and Surgeon  Anesthesia Plan Comments:         Anesthesia Quick Evaluation

## 2018-05-02 NOTE — Progress Notes (Signed)
PT Note  Patient Details Name: Willie Spears MRN: [id number] DOB: [birth year]     Chart reviewed and pt in procedure this morning. Noted in chart pt is independent with ambulation no AD and independent with all ADL PTA. Please call if this is different in the hospital. Will check back with pt and nurse when pt out of procedure.   Marella Bile 05/02/2018, 8:27 AM  Marella Bile, PT Acute Rehabilitation Services Pager: [phone] Office: [phone] 05/02/2018

## 2018-05-02 NOTE — Progress Notes (Signed)
The risk, benefits and alternatives of incision and drainage of scrotal/perineal abscess was discussed with the patient.  Risks include, but are not limited to, bleeding, worsening infection, need for multiple operations, chronic pain, testicular loss, MI, CVA, DVT, PE and the inherent risk of general anesthesia.  He voices understanding and wishes to proceed.

## 2018-05-02 NOTE — Anesthesia Postprocedure Evaluation (Signed)
Anesthesia Post Note  Patient: Willie Spears  Procedure(s) Performed: IRRIGATION AND DEBRIDEMENT ABSCESS scrotal (N/A Scrotum)     Patient location during evaluation: PACU Anesthesia Type: General Level of consciousness: awake and alert Pain management: pain level controlled Vital Signs Assessment: post-procedure vital signs reviewed and stable Respiratory status: spontaneous breathing, nonlabored ventilation, respiratory function stable and patient connected to nasal cannula oxygen Cardiovascular status: blood pressure returned to baseline and stable Postop Assessment: no apparent nausea or vomiting Anesthetic complications: no    Last Vitals:  Vitals:   05/02/18 1051 05/02/18 1101  BP: (!) 165/92   Pulse: 83 76  Resp: 15   Temp: 36.9 C   SpO2: 100% 100%    Last Pain:  Vitals:   05/02/18 1109  TempSrc:   PainSc: 7                  Felecia Stanfill DAVID

## 2018-05-03 ENCOUNTER — Encounter (HOSPITAL_COMMUNITY): Payer: Self-pay | Admitting: Urology

## 2018-05-03 LAB — CBC WITH DIFFERENTIAL/PLATELET
Abs Immature Granulocytes: 0.33 10*3/uL — ABNORMAL HIGH (ref 0.00–0.07)
Basophils Absolute: 0.1 10*3/uL (ref 0.0–0.1)
Basophils Relative: 0 %
Eosinophils Absolute: 0.2 10*3/uL (ref 0.0–0.5)
Eosinophils Relative: 2 %
HCT: 33.7 % — ABNORMAL LOW (ref 39.0–52.0)
Hemoglobin: 10.8 g/dL — ABNORMAL LOW (ref 13.0–17.0)
Immature Granulocytes: 2 %
Lymphocytes Relative: 16 %
Lymphs Abs: 2.4 10*3/uL (ref 0.7–4.0)
MCH: 23.8 pg — ABNORMAL LOW (ref 26.0–34.0)
MCHC: 32 g/dL (ref 30.0–36.0)
MCV: 74.4 fL — ABNORMAL LOW (ref 80.0–100.0)
Monocytes Absolute: 1 10*3/uL (ref 0.1–1.0)
Monocytes Relative: 7 %
Neutro Abs: 10.9 10*3/uL — ABNORMAL HIGH (ref 1.7–7.7)
Neutrophils Relative %: 73 %
Platelets: 429 10*3/uL — ABNORMAL HIGH (ref 150–400)
RBC: 4.53 MIL/uL (ref 4.22–5.81)
RDW: 13.6 % (ref 11.5–15.5)
WBC: 15 10*3/uL — ABNORMAL HIGH (ref 4.0–10.5)
nRBC: 0 % (ref 0.0–0.2)

## 2018-05-03 LAB — CREATININE, SERUM
Creatinine, Ser: 1.44 mg/dL — ABNORMAL HIGH (ref 0.61–1.24)
GFR calc Af Amer: >60 mL/min (ref >60)
GFR calc non Af Amer: >60 mL/min (ref >60)

## 2018-05-03 LAB — GLUCOSE, CAPILLARY
GLUCOSE-CAPILLARY: 92 mg/dL (ref 70–99)
Glucose-Capillary: 200 mg/dL — ABNORMAL HIGH (ref 70–99)
Glucose-Capillary: 134 mg/dL — ABNORMAL HIGH (ref 70–99)
Glucose-Capillary: 147 mg/dL — ABNORMAL HIGH (ref 70–99)

## 2018-05-03 MED ORDER — VANCOMYCIN HCL IN DEXTROSE 1-5 GM/200ML-% IV SOLN
1000.0000 mg | Freq: Two times a day (BID) | INTRAVENOUS | Status: DC
  Administered 2018-05-04: 1000 mg via INTRAVENOUS
  Filled 2018-05-03 (×2): qty 200

## 2018-05-03 NOTE — Care Management Note (Signed)
Case Management Note  Patient Details  Name: Willie Spears MRN: [id number] Date of Birth: January 20, 1991  Subjective/Objective: Scrotal Abscess. DM. From home. No health insurance or pcp. Provided w/[facility] clinics for pcp(patient can make own appt). Informed patient that if appt set @ [facility] clinics can get meds @ [facility] pharmacy-patient voiced understanding. May need MATCH program if d/c on insulin. Await recc on meds @ d/c.                 Action/Plan:d/c home.   Expected Discharge Date:                  Expected Discharge Plan:  Home/Self Care  In-House Referral:     Discharge planning Services  CM Consult, Indigent Health Clinic  Post Acute Care Choice:    Choice offered to:     DME Arranged:    DME Agency:     HH Arranged:    HH Agency:     Status of Service:  In process, will continue to follow  If discussed at Long Length of Stay Meetings, dates discussed:    Additional Comments:  Lanier Clam, RN 05/03/2018, 1:07 PM

## 2018-05-03 NOTE — Progress Notes (Signed)
NUTRITION NOTE  RD consulted for nutrition education regarding diabetes.   Lab Results  Component Value Date   HGBA1C 12.2 (H) 05/02/2018    RD provided "Carbohydrate Counting for People with Diabetes" handout from the Academy of Nutrition and Dietetics. Discussed different food groups and their effects on blood sugar, emphasizing carbohydrate-containing foods. Provided list of carbohydrates and recommended serving sizes of common foods.  Discussed importance of controlled and consistent carbohydrate intake throughout the day. Provided examples of ways to balance meals/snacks and encouraged intake of high-fiber, whole grain complex carbohydrates. Teach back method used.  Patient has a good understanding of carbohydrate-containing foods and foods that do not contain carbohydrates. He is good about reading nutrition labels and plans to continue to use these. Patient was previously eating many highly processed items (such as honey buns, Doritos, and fruity pebbles).  Expect good compliance.  Body mass index is 34.88 kg/m. Pt meets criteria for obesity based on current BMI.  Current diet order is Carb Modified, patient is consuming approximately 100% of meals at this time. Labs and medications reviewed. No further nutrition interventions warranted at this time. RD contact information provided. If additional nutrition issues arise, please re-consult RD.    Willie Gammon, MS, RD, LDN, [facility] Inpatient Clinical Dietitian Pager # [phone] After hours/weekend pager # [phone]

## 2018-05-03 NOTE — Progress Notes (Signed)
Inpatient Diabetes Program Recommendations  AACE/ADA: New Consensus Statement on Inpatient Glycemic Control (2015)  Target Ranges:  Prepandial:   less than 140 mg/dL      Peak postprandial:   less than 180 mg/dL (1-2 hours)      Critically ill patients:  140 - 180 mg/dL   Lab Results  Component Value Date   GLUCAP 134 (H) 05/03/2018   HGBA1C 12.2 (H) 05/02/2018    Review of Glycemic Control  CBGs 200, 134 Eating 100%  Spoke with pt and Mom regarding HgbA1C of 12.2%. Pt states he stopped taking metformin and Invokana because he "didn't like the way it made him feel." Pt recently lost insurance and moved from [place] to [place]. Needs PCP to manage his diabetes. Pt states he wants to lose 30 pounds and get back on an exercise program. Demonstrated insulin pen and pt wants pen instead of vial/syringe d/t convenience. Discussed Living Well book, glucose monitoring, diet, exercising daily, and f/u with PCP. Educated on s/s and treatment of hypoglycemia. Pt voiced understanding. If pt can obtain appt at [facility], he can get both Lantus and Novolog at [facility] pharmacy.   Inpatient Diabetes Program Recommendations:   Increase Lantus to 16 units QHS Increase Novolog to 5 units tidwc for meal coverage insulin.  Will f/u in am regarding appt at [facility] and insulin pen education. Answered all questions.  Thank you. Ailene Ards, RD, LDN, CDE Inpatient Diabetes Coordinator [phone]

## 2018-05-03 NOTE — Progress Notes (Signed)
Pharmacy Antibiotic Note  Willie Spears is a 28 y.o. male admitted on 05/01/2018 with wound infection, seen in ED 2/27 with gluteal abscess, drained/packed > on Clindamycin. Return 2/29 with increased swelling, scrotal involvement CT: air tracking from wound to perineal region  Plan: 1) Day 3 Vanc/Zosyn 2) Rise in SCr - change vanc from 1250mg  IV q12 to 1g IV q12 - goal AUC 400-550 3) Continue current Zosyn dosing 4) Daily SCr  Height: 5' 10.98" (180.3 cm) Weight: 250 lb (113.4 kg) IBW/kg (Calculated) : 75.26  Temp (24hrs), Avg:98.2 F (36.8 C), Min:98 F (36.7 C), Max:98.6 F (37 C)  Recent Labs  Lab 05/01/18 0821 05/02/18 0425 05/03/18 0404 05/03/18 1147  WBC 20.4* 18.2* 15.0*  --   CREATININE 1.18 1.10  --  1.44*  LATICACIDVEN 1.1  --   --   --     Estimated Creatinine Clearance: 99.5 mL/min (A) (by C-G formula based on SCr of 1.44 mg/dL (H)).    No Known Allergies  Antimicrobials this admission: 2/29 Zosyn >>  2/29 Vancomycin >>   Dose adjustments this admission:  Microbiology results: 3/1 abscess: pending  Thank you for allowing pharmacy to be a part of this patient's care.  Hessie Knows, PharmD, BCPS Pager [phone] 05/03/2018 1:38 PM

## 2018-05-03 NOTE — Progress Notes (Signed)
1 Day Post-Op Subjective: NAEO.  Pain controlled.  He was able to ambulate last night.  AFVSS. WBC improving.   Objective: Vital signs in last 24 hours: Temp:  [97.4 F (36.3 C)-98.6 F (37 C)] 98 F (36.7 C) (03/02 0609) Pulse Rate:  [67-84] 73 (03/02 0609) Resp:  [11-22] 18 (03/02 0609) BP: (133-198)/(77-133) 146/90 (03/02 0609) SpO2:  [94 %-100 %] 97 % (03/02 0609)  Intake/Output from previous day: 03/01 0701 - 03/02 0700 In: 3925 [P.O.:600; I.V.:2762.2; IV Piggyback:562.8] Out: 750 [Urine:600; Blood:150]  Intake/Output this shift: No intake/output data recorded.  Physical Exam:  General: Alert and oriented CV: RRR, palpable distal pulses Lungs: CTAB, equal chest rise Abdomen: Soft, NTND, no rebound or guarding Incisions: The scrotal/perinal incision is intact with a mild amount of serosang drainage.  No signs of spreading cellulitis or crepitus involving the scrotum or perineum.  Ext: NT, No erythema  Lab Results: Recent Labs    05/01/18 0821 05/02/18 0425 05/03/18 0404  HGB 13.9 13.1 10.8*  HCT 44.6 41.2 33.7*   BMET Recent Labs    05/01/18 0821 05/01/18 1444 05/02/18 0425  NA 132*  --  137  K 4.0  --  3.6  CL 94*  --  102  CO2 25  --  24  GLUCOSE 447* 416* 197*  BUN 15  --  11  CREATININE 1.18  --  1.10  CALCIUM 8.8*  --  8.7*     Studies/Results: Ct Pelvis W Contrast  Result Date: 05/01/2018 CLINICAL DATA:  Scrotal swelling and buttock abscess EXAM: CT PELVIS WITH CONTRAST TECHNIQUE: Multidetector CT imaging of the pelvis was performed using the standard protocol following the bolus administration of intravenous contrast. CONTRAST:  ISOVUE-300 IOPAMIDOL (ISOVUE-300) INJECTION 61% COMPARISON:  Prior ultrasound from 04/29/2018 FINDINGS: Urinary Tract:  Bladder is well distended. Bowel: Appendix is unremarkable. No obstructive or inflammatory changes of the bowel are seen. Vascular/Lymphatic: No pathologically enlarged lymph nodes. No significant  vascular abnormality seen. Reproductive: Prostate is within normal limits. Considerable scrotal swelling and edema is noted similar to that seen on prior ultrasound examination. Air is noted along the medial aspect of the right buttock adjacent to the scrotum with air extending anteriorly into the base of the scrotum posteriorly. Testicles appear within normal limits. No other focal abnormality is noted. Other:  None. Musculoskeletal: No suspicious bone lesions identified. IMPRESSION: Changes consistent with the known right buttock cellulitis, however there is extension of inflammatory change and mottled gas anteriorly towards the base of the scrotum. In total this area measures approximately 10 x 3.3 cm in greatest AP and craniocaudad projection. No discrete fluid collection is identified. Electronically Signed   By: Alcide Clever M.D.   On: 05/01/2018 10:00    Assessment/Plan: 28 year old male with Fournier's gangrene and poorly controlled DM2.   POD1 s/p scrotal debridement   -His infection appears to be contained today.  Wound cxs are pending.  Continue Vanc and Zosyn.   -Will continue to monitor.   LOS: 2 days   Rhoderick Moody, MD [facility] Pager: [phone]  05/03/2018, 8:42 AM

## 2018-05-03 NOTE — Progress Notes (Signed)
PROGRESS NOTE    Willie Spears  OHF:[id number] DOB: May 22, 1990 DOA: 05/01/2018 PCP: Laren Boom, DO   Brief Narrative: Patient is a 28 year old male with past medical history of diabetes mellitus type 2/uncontrolled who presents to the emergency department with complaints of worsening scrotal pain.  He was recently seen in the emergency department on 2/27 for a gluteal abscess which was drained and packed was discharged to home on oral antibiotics.  Patient reported that pain was not controlled.  Patient was also not taking any medications for his diabetes currently.  Reports 2-week history of worsening scrotal-perirectal abscess.  CT pelvis showed soft tissue density as well as air tracking in the perineal region.  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  Urology consulted and he underwent incision and drainage with debridement of the scrotal and perineal abscesses on 05/02/18.  Assessment & Plan:   Active Problems:   Scrotal abscess  Fournier's gangrene/scrotal-perineal abscesses: Urology folllowing.  Underwent incision and drainage with debridement.  Currently hemodynamically stable.  Will follow  aerobic/anaerobic cultures.  Cultures showing Streptococcus agalactiae which could be a contamination.Currently on vancomycin and Zosyn which we will continue. IV fluids will be stopped.  AKI: Creatinine of 1.44.  Will check BMP tomorrow  Leucocytosis: We will continue to monitor the trend.improving.  Type 2 diabetes mellitus: Uncontrolled with hyperglycemia.  Not on medication at home.  Diabetic coordinator consulted.  Started on insulin here.  Hemoglobin A1c of 12.2.  He was previously just on metformin at home.  He might need insulin on discharge.  He needs insulin education. Will check diabetic coordinator recommendation.  Obesity: BMI of 35.         DVT prophylaxis: lovenox Code Status: Full Family Communication: Family member present at the bedside Disposition  Plan: Home after urology clearance   Consultants: Urology  Procedures: Incision and drainage, debridement  Antimicrobials:  Anti-infectives (From admission, onward)   Start     Dose/Rate Route Frequency Ordered Stop   05/04/18 0200  vancomycin (VANCOCIN) IVPB 1000 mg/200 mL premix     1,000 mg 200 mL/hr over 60 Minutes Intravenous Every 12 hours 05/03/18 1338     05/01/18 2359  vancomycin (VANCOCIN) 1,250 mg in sodium chloride 0.9 % 250 mL IVPB  Status:  Discontinued     1,250 mg 166.7 mL/hr over 90 Minutes Intravenous Every 12 hours 05/01/18 1350 05/03/18 1338   05/01/18 1800  piperacillin-tazobactam (ZOSYN) IVPB 3.375 g     3.375 g 12.5 mL/hr over 240 Minutes Intravenous Every 8 hours 05/01/18 1344     05/01/18 1100  piperacillin-tazobactam (ZOSYN) IVPB 3.375 g     3.375 g 100 mL/hr over 30 Minutes Intravenous STAT 05/01/18 1045 05/01/18 1124   05/01/18 1100  vancomycin (VANCOCIN) 2,000 mg in sodium chloride 0.9 % 500 mL IVPB     2,000 mg 250 mL/hr over 120 Minutes Intravenous STAT 05/01/18 1046 05/01/18 1351      Subjective: Patient seen and examined the bedside this afternoon.  Very comfortable.  Denies any pain. Objective: Vitals:   05/03/18 0228 05/03/18 0609 05/03/18 1402 05/03/18 1403  BP: (!) 150/92 (!) 146/90 (!) 158/109 (!) 159/97  Pulse: 80 73 74 69  Resp: 18 18 18    Temp: 98 F (36.7 C) 98 F (36.7 C)    TempSrc: Oral Oral    SpO2: 96% 97% 100%   Weight:      Height:        Intake/Output  Summary (Last 24 hours) at 05/03/2018 1420 Last data filed at 05/03/2018 1000 Gross per 24 hour  Intake 2465.38 ml  Output 1100 ml  Net 1365.38 ml   Filed Weights   05/01/18 1403  Weight: 113.4 kg    Examination:   General exam: Appears calm and comfortable ,Not in distress,obese HEENT:PERRL,Oral mucosa moist, Ear/Nose normal on gross exam Respiratory system: Bilateral equal air entry, normal vesicular breath sounds, no wheezes or crackles  Cardiovascular  system: S1 & S2 heard, RRR. No JVD, murmurs, rubs, gallops or clicks. Gastrointestinal system: Abdomen is nondistended, soft and nontender. No organomegaly or masses felt. Normal bowel sounds heard. Central nervous system: Alert and oriented. No focal neurological deficits. Extremities: No edema, no clubbing ,no cyanosis, distal peripheral pulses palpable. Skin/GU: Scrotal edema.     Data Reviewed: I have personally reviewed following labs and imaging studies  CBC: Recent Labs  Lab 05/01/18 0821 05/02/18 0425 05/03/18 0404  WBC 20.4* 18.2* 15.0*  NEUTROABS 16.4*  --  10.9*  HGB 13.9 13.1 10.8*  HCT 44.6 41.2 33.7*  MCV 73.7* 73.8* 74.4*  PLT 489* 487* 429*   Basic Metabolic Panel: Recent Labs  Lab 05/01/18 0821 05/01/18 1444 05/02/18 0425 05/03/18 1147  NA 132*  --  137  --   K 4.0  --  3.6  --   CL 94*  --  102  --   CO2 25  --  24  --   GLUCOSE 447* 416* 197*  --   BUN 15  --  11  --   CREATININE 1.18  --  1.10 1.44*  CALCIUM 8.8*  --  8.7*  --    GFR: Estimated Creatinine Clearance: 99.5 mL/min (A) (by C-G formula based on SCr of 1.44 mg/dL (H)). Liver Function Tests: Recent Labs  Lab 05/01/18 0821 05/02/18 0425  AST 13* 32  ALT 13 35  ALKPHOS 140* 190*  BILITOT 0.5 0.7  PROT 8.0 7.7  ALBUMIN 3.5 3.0*   No results for input(s): LIPASE, AMYLASE in the last 168 hours. No results for input(s): AMMONIA in the last 168 hours. Coagulation Profile: No results for input(s): INR, PROTIME in the last 168 hours. Cardiac Enzymes: No results for input(s): CKTOTAL, CKMB, CKMBINDEX, TROPONINI in the last 168 hours. BNP (last 3 results) No results for input(s): PROBNP in the last 8760 hours. HbA1C: Recent Labs    05/01/18 0821 05/02/18 0425  HGBA1C 12.4* 12.2*   CBG: Recent Labs  Lab 05/02/18 1115 05/02/18 1721 05/02/18 2135 05/03/18 0759 05/03/18 1206  GLUCAP 202* 221* 214* 200* 134*   Lipid Profile: No results for input(s): CHOL, HDL, LDLCALC, TRIG,  CHOLHDL, LDLDIRECT in the last 72 hours. Thyroid Function Tests: No results for input(s): TSH, T4TOTAL, FREET4, T3FREE, THYROIDAB in the last 72 hours. Anemia Panel: No results for input(s): VITAMINB12, FOLATE, FERRITIN, TIBC, IRON, RETICCTPCT in the last 72 hours. Sepsis Labs: Recent Labs  Lab 05/01/18 9937  LATICACIDVEN 1.1    Recent Results (from the past 240 hour(s))  Surgical pcr screen     Status: None   Collection Time: 05/01/18  8:09 PM  Result Value Ref Range Status   MRSA, PCR NEGATIVE NEGATIVE Final   Staphylococcus aureus NEGATIVE NEGATIVE Final    Comment: (NOTE) The Xpert SA Assay (FDA approved for NASAL specimens in patients 68 years of age and older), is one component of a comprehensive surveillance program. It is not intended to diagnose infection nor to guide or monitor treatment.  Performed at [facility], [address], [place], [place] [address]   Aerobic/Anaerobic Culture (surgical/deep wound)     Status: None (Preliminary result)   Collection Time: 05/02/18  8:01 AM  Result Value Ref Range Status   Specimen Description   Final    ABSCESS Performed at [facility], [address] [place], [place] [address]    Special Requests   Final    NONE Performed at [facility], [address] [place], [place] [address]    Gram Stain   Final    NO WBC SEEN FEW GRAM POSITIVE COCCI IN PAIRS IN CLUSTERS    Culture   Final    ABUNDANT GROUP B STREP(S.AGALACTIAE)ISOLATED TESTING AGAINST S. AGALACTIAE NOT ROUTINELY PERFORMED DUE TO PREDICTABILITY OF AMP/PEN/VAN SUSCEPTIBILITY. Performed at [facility], [address], [place], [place] [address]    Report Status PENDING  Incomplete         Radiology Studies: No results found.      Scheduled Meds: . docusate sodium  100 mg Oral BID  . enoxaparin (LOVENOX) injection  55 mg Subcutaneous Q24H  . Influenza vac split quadrivalent PF  0.5 mL  Intramuscular Tomorrow-1000  . insulin aspart  0-15 Units Subcutaneous TID WC  . insulin aspart  0-5 Units Subcutaneous QHS  . insulin aspart  4 Units Subcutaneous TID WC  . insulin glargine  12 Units Subcutaneous QHS  . polyethylene glycol  17 g Oral Daily   Continuous Infusions: . sodium chloride 100 mL/hr at 05/03/18 1230  . sodium chloride Stopped (05/03/18 0220)  . piperacillin-tazobactam (ZOSYN)  IV 3.375 g (05/03/18 1023)  . [START ON 05/04/2018] vancomycin       LOS: 2 days    Time spent: 25 mins.More than 50% of that time was spent in counseling and/or coordination of care.      Burnadette Pop, MD [facility] Pager [phone]  If 7PM-7AM, please contact night-coverage [email or web address] Password [email or web address] 05/03/2018, 2:20 PM

## 2018-05-03 NOTE — Progress Notes (Signed)
PT Cancellation Note  Patient Details Name: Willie Spears MRN: [id number] DOB: [birth year]   Cancelled Treatment:    Reason Eval/Treat Not Completed: PT screened, no needs identified, will sign off , patient ambulatory.  Rada Hay 05/03/2018, 9:16 AM Blanchard Kelch PT Acute Rehabilitation Services Pager [phone] Office [phone]

## 2018-05-04 LAB — CBC WITH DIFFERENTIAL/PLATELET
ABS IMMATURE GRANULOCYTES: 0.43 10*3/uL — AB (ref 0.00–0.07)
Basophils Absolute: 0.1 10*3/uL (ref 0.0–0.1)
Basophils Relative: 0 %
Eosinophils Absolute: 0.2 10*3/uL (ref 0.0–0.5)
Eosinophils Relative: 2 %
HCT: 33.1 % — ABNORMAL LOW (ref 39.0–52.0)
HEMOGLOBIN: 10.3 g/dL — AB (ref 13.0–17.0)
Immature Granulocytes: 3 %
Lymphocytes Relative: 22 %
Lymphs Abs: 3.2 10*3/uL (ref 0.7–4.0)
MCH: 23.3 pg — ABNORMAL LOW (ref 26.0–34.0)
MCHC: 31.1 g/dL (ref 30.0–36.0)
MCV: 74.7 fL — ABNORMAL LOW (ref 80.0–100.0)
Monocytes Absolute: 0.8 10*3/uL (ref 0.1–1.0)
Monocytes Relative: 6 %
Neutro Abs: 9.6 10*3/uL — ABNORMAL HIGH (ref 1.7–7.7)
Neutrophils Relative %: 67 %
Platelets: 426 10*3/uL — ABNORMAL HIGH (ref 150–400)
RBC: 4.43 MIL/uL (ref 4.22–5.81)
RDW: 13.7 % (ref 11.5–15.5)
WBC: 14.3 10*3/uL — ABNORMAL HIGH (ref 4.0–10.5)
nRBC: 0 % (ref 0.0–0.2)

## 2018-05-04 LAB — BASIC METABOLIC PANEL
ANION GAP: 11 (ref 5–15)
BUN: 14 mg/dL (ref 6–20)
CO2: 21 mmol/L — ABNORMAL LOW (ref 22–32)
Calcium: 8.2 mg/dL — ABNORMAL LOW (ref 8.9–10.3)
Chloride: 106 mmol/L (ref 98–111)
Creatinine, Ser: 1.42 mg/dL — ABNORMAL HIGH (ref 0.61–1.24)
GFR calc non Af Amer: >60 mL/min (ref >60)
Glucose, Bld: 135 mg/dL — ABNORMAL HIGH (ref 70–99)
Potassium: 3.6 mmol/L (ref 3.5–5.1)
Sodium: 138 mmol/L (ref 135–145)

## 2018-05-04 LAB — GLUCOSE, CAPILLARY
Glucose-Capillary: 105 mg/dL — ABNORMAL HIGH (ref 70–99)
Glucose-Capillary: 128 mg/dL — ABNORMAL HIGH (ref 70–99)
Glucose-Capillary: 160 mg/dL — ABNORMAL HIGH (ref 70–99)
Glucose-Capillary: 107 mg/dL — ABNORMAL HIGH (ref 70–99)

## 2018-05-04 MED ORDER — SODIUM CHLORIDE 0.9 % IV SOLN
INTRAVENOUS | Status: DC
  Administered 2018-05-04 (×2): via INTRAVENOUS

## 2018-05-04 MED ORDER — AMLODIPINE BESYLATE 10 MG PO TABS
10.0000 mg | ORAL_TABLET | Freq: Every day | ORAL | Status: DC
  Administered 2018-05-04 – 2018-05-05 (×2): 10 mg via ORAL
  Filled 2018-05-04 (×2): qty 1

## 2018-05-04 MED ORDER — INSULIN GLARGINE 100 UNIT/ML ~~LOC~~ SOLN
16.0000 [IU] | Freq: Every day | SUBCUTANEOUS | Status: DC
  Administered 2018-05-04: 16 [IU] via SUBCUTANEOUS
  Filled 2018-05-04 (×2): qty 0.16

## 2018-05-04 MED ORDER — SODIUM CHLORIDE 0.9 % IV SOLN
3.0000 g | Freq: Four times a day (QID) | INTRAVENOUS | Status: DC
  Administered 2018-05-04 – 2018-05-05 (×5): 3 g via INTRAVENOUS
  Filled 2018-05-04 (×6): qty 3

## 2018-05-04 MED ORDER — INSULIN ASPART 100 UNIT/ML ~~LOC~~ SOLN
5.0000 [IU] | Freq: Three times a day (TID) | SUBCUTANEOUS | Status: DC
  Administered 2018-05-04: 5 [IU] via SUBCUTANEOUS

## 2018-05-04 NOTE — Progress Notes (Signed)
Inpatient Diabetes Program Recommendations  AACE/ADA: New Consensus Statement on Inpatient Glycemic Control (2015)  Target Ranges:  Prepandial:   less than 140 mg/dL      Peak postprandial:   less than 180 mg/dL (1-2 hours)      Critically ill patients:  140 - 180 mg/dL   Lab Results  Component Value Date   GLUCAP 160 (H) 05/04/2018   HGBA1C 12.2 (H) 05/02/2018    Review of Glycemic Control  Blood sugars 105-160 mg/dL today.  Educated patient on insulin pen use at home. Reviewed contents of insulin flexpen starter kit. Reviewed all steps if insulin pen including attachment of needle, 2-unit air shot, dialing up dose, giving injection, removing needle, disposal of sharps, storage of unused insulin, disposal of insulin etc. Patient able to provide successful return demonstration. Also reviewed troubleshooting with insulin pen. MD to give patient Rxs for insulin pens and insulin pen needles.  For discharge:  If pt can get meds at [facility]: Lantus 16 units QHS Novolog 5 units tidwc  If not, will need affordable insulin. Novolin 70/30 pen 12 units bid Novolin R (for s/s - 0-15 units tidwc and hs)  Pt seems very motivated to make lifestyle changes to control blood sugars.   Continue to follow.  Thank you. Lorenda Peck, RD, LDN, CDE Inpatient Diabetes Coordinator [phone]

## 2018-05-04 NOTE — Progress Notes (Signed)
2 Days Post-Op Subjective: NAEO.  Pain controlled. AFVSS.  WBC slowly improving.  H/H stable  Objective: Vital signs in last 24 hours: Temp:  [98.7 F (37.1 C)] 98.7 F (37.1 C) (03/03 0516) Pulse Rate:  [69-75] 72 (03/03 0516) Resp:  [18] 18 (03/03 0516) BP: (158-160)/(90-109) 159/94 (03/03 0516) SpO2:  [98 %-100 %] 98 % (03/03 0516)  Intake/Output from previous day: 03/02 0701 - 03/03 0700 In: 1687.7 [P.O.:480; I.V.:907.7; IV Piggyback:300] Out: 2300 [Urine:2300]  Intake/Output this shift: No intake/output data recorded.  Physical Exam:  General: Alert and oriented CV: RRR, palpable distal pulses Lungs: CTAB, equal chest rise Abdomen: Soft, NTND, no rebound or guarding Incisions: scrotal/perineal incision is intact with serosang drainage.  Less output compared to yesterday.  The scrotum is mildly edematous, but with no crepitus for areas of necrosis.    Lab Results: Recent Labs    05/02/18 0425 05/03/18 0404 05/04/18 0506  HGB 13.1 10.8* 10.3*  HCT 41.2 33.7* 33.1*   BMET Recent Labs    05/02/18 0425 05/03/18 1147 05/04/18 0506  NA 137  --  138  K 3.6  --  3.6  CL 102  --  106  CO2 24  --  21*  GLUCOSE 197*  --  135*  BUN 11  --  14  CREATININE 1.10 1.44* 1.42*  CALCIUM 8.7*  --  8.2*     Studies/Results: No results found.  Assessment/Plan: 28 year old male with Fournier's gangrene and poorly controlled DM2.   POD2 s/p scrotal debridement   -Scrotal packing removed and dressing changed.  Continue to change dressings q shift -Continue vanc and zosyn.  Cultures pending -Blood sugars are improving.  Stressed the improtance of tight DM control moving forward -Will continue to monitor    LOS: 3 days   Rhoderick Moody, MD [facility] Pager: [phone]  05/04/2018, 9:35 AM

## 2018-05-04 NOTE — Progress Notes (Addendum)
PROGRESS NOTE    Willie Spears  VVZ:[id number] DOB: 1990-06-29 DOA: 05/01/2018 PCP: Laren Boom, DO   Brief Narrative: Patient is a 28 year old male with past medical history of diabetes mellitus type 2/uncontrolled who presents to the emergency department with complaints of worsening scrotal pain.  He was recently seen in the emergency department on 2/27 for a gluteal abscess which was drained and packed was discharged to home on oral antibiotics.  Patient reported that pain was not controlled.  Patient was also not taking any medications for his diabetes currently.  Reports 2-week history of worsening scrotal-perirectal abscess.  CT pelvis showed soft tissue density as well as air tracking in the perineal region.  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  Urology consulted and he underwent incision and drainage with debridement of the scrotal and perineal abscesses on 05/02/18.  Assessment & Plan:   Active Problems:   Scrotal abscess  Fournier's gangrene/scrotal-perineal abscesses: Urology folllowing.  Underwent incision and drainage with debridement.  Currently hemodynamically stable. Cultures showing Streptococcus agalactiae .Currently on unasyn.  AKI: Creatinine of 1.4.  Start on IV fluids.  Will check BMP tomorrow.  Leucocytosis: We will continue to monitor the trend.improving.  Type 2 diabetes mellitus: Uncontrolled with hyperglycemia.  Not on medication at home.  Diabetic coordinator consulted.  Started on insulin here.  Hemoglobin A1c of 12.2.  He was previously just on metformin at home.  He might need insulin on discharge.  He needs insulin education. Will check diabetic coordinator"s recommendation for discharge regimen.  Microcytic anemia: We will check iron panel  Obesity: BMI of 35.          DVT prophylaxis: lovenox Code Status: Full Family Communication: None present at the bedside Disposition Plan: Home after urology clearance   Consultants:  Urology  Procedures: Incision and drainage, debridement  Antimicrobials:  Anti-infectives (From admission, onward)   Start     Dose/Rate Route Frequency Ordered Stop   05/04/18 1200  Ampicillin-Sulbactam (UNASYN) 3 g in sodium chloride 0.9 % 100 mL IVPB     3 g 200 mL/hr over 30 Minutes Intravenous Every 6 hours 05/04/18 1005     05/04/18 0200  vancomycin (VANCOCIN) IVPB 1000 mg/200 mL premix  Status:  Discontinued     1,000 mg 200 mL/hr over 60 Minutes Intravenous Every 12 hours 05/03/18 1338 05/04/18 1005   05/01/18 2359  vancomycin (VANCOCIN) 1,250 mg in sodium chloride 0.9 % 250 mL IVPB  Status:  Discontinued     1,250 mg 166.7 mL/hr over 90 Minutes Intravenous Every 12 hours 05/01/18 1350 05/03/18 1338   05/01/18 1800  piperacillin-tazobactam (ZOSYN) IVPB 3.375 g  Status:  Discontinued     3.375 g 12.5 mL/hr over 240 Minutes Intravenous Every 8 hours 05/01/18 1344 05/04/18 1005   05/01/18 1100  piperacillin-tazobactam (ZOSYN) IVPB 3.375 g     3.375 g 100 mL/hr over 30 Minutes Intravenous STAT 05/01/18 1045 05/01/18 1124   05/01/18 1100  vancomycin (VANCOCIN) 2,000 mg in sodium chloride 0.9 % 500 mL IVPB     2,000 mg 250 mL/hr over 120 Minutes Intravenous STAT 05/01/18 1046 05/01/18 1351      Subjective: Patient seen and examined the bedside this afternoon.  Comfortable.  Pain well controlled. Objective: Vitals:   05/03/18 1403 05/03/18 2226 05/04/18 0516 05/04/18 1306  BP: (!) 159/97 (!) 160/90 (!) 159/94 (!) 171/91  Pulse: 69 75 72 66  Resp:  18 18   Temp:  98.7 F (37.1 C) 98.7 F (37.1 C) 97.8 F (36.6 C)  TempSrc:  Oral Oral Oral  SpO2:  99% 98% 99%  Weight:      Height:        Intake/Output Summary (Last 24 hours) at 05/04/2018 1322 Last data filed at 05/04/2018 1024 Gross per 24 hour  Intake 1189.88 ml  Output 1600 ml  Net -410.12 ml   Filed Weights   05/01/18 1403  Weight: 113.4 kg    Examination:  General exam: Appears calm and comfortable ,Not in  distress,obese HEENT:PERRL,Oral mucosa moist, Ear/Nose normal on gross exam Respiratory system: Bilateral equal air entry, normal vesicular breath sounds, no wheezes or crackles  Cardiovascular system: S1 & S2 heard, RRR. No JVD, murmurs, rubs, gallops or clicks. Gastrointestinal system: Abdomen is nondistended, soft and nontender. No organomegaly or masses felt. Normal bowel sounds heard. Central nervous system: Alert and oriented. No focal neurological deficits. Extremities: No edema, no clubbing ,no cyanosis, distal peripheral pulses palpable. Skin/GU: Scrotal edema, clean surgical incision with sutures.   Data Reviewed: I have personally reviewed following labs and imaging studies  CBC: Recent Labs  Lab 05/01/18 0821 05/02/18 0425 05/03/18 0404 05/04/18 0506  WBC 20.4* 18.2* 15.0* 14.3*  NEUTROABS 16.4*  --  10.9* 9.6*  HGB 13.9 13.1 10.8* 10.3*  HCT 44.6 41.2 33.7* 33.1*  MCV 73.7* 73.8* 74.4* 74.7*  PLT 489* 487* 429* 426*   Basic Metabolic Panel: Recent Labs  Lab 05/01/18 0821 05/01/18 1444 05/02/18 0425 05/03/18 1147 05/04/18 0506  NA 132*  --  137  --  138  K 4.0  --  3.6  --  3.6  CL 94*  --  102  --  106  CO2 25  --  24  --  21*  GLUCOSE 447* 416* 197*  --  135*  BUN 15  --  11  --  14  CREATININE 1.18  --  1.10 1.44* 1.42*  CALCIUM 8.8*  --  8.7*  --  8.2*   GFR: Estimated Creatinine Clearance: 100.9 mL/min (A) (by C-G formula based on SCr of 1.42 mg/dL (H)). Liver Function Tests: Recent Labs  Lab 05/01/18 0821 05/02/18 0425  AST 13* 32  ALT 13 35  ALKPHOS 140* 190*  BILITOT 0.5 0.7  PROT 8.0 7.7  ALBUMIN 3.5 3.0*   No results for input(s): LIPASE, AMYLASE in the last 168 hours. No results for input(s): AMMONIA in the last 168 hours. Coagulation Profile: No results for input(s): INR, PROTIME in the last 168 hours. Cardiac Enzymes: No results for input(s): CKTOTAL, CKMB, CKMBINDEX, TROPONINI in the last 168 hours. BNP (last 3 results) No  results for input(s): PROBNP in the last 8760 hours. HbA1C: Recent Labs    05/02/18 0425  HGBA1C 12.2*   CBG: Recent Labs  Lab 05/03/18 1206 05/03/18 1633 05/03/18 2221 05/04/18 0722 05/04/18 1128  GLUCAP 134* 147* 92 105* 128*   Lipid Profile: No results for input(s): CHOL, HDL, LDLCALC, TRIG, CHOLHDL, LDLDIRECT in the last 72 hours. Thyroid Function Tests: No results for input(s): TSH, T4TOTAL, FREET4, T3FREE, THYROIDAB in the last 72 hours. Anemia Panel: No results for input(s): VITAMINB12, FOLATE, FERRITIN, TIBC, IRON, RETICCTPCT in the last 72 hours. Sepsis Labs: Recent Labs  Lab 05/01/18 1610  LATICACIDVEN 1.1    Recent Results (from the past 240 hour(s))  Surgical pcr screen     Status: None   Collection Time: 05/01/18  8:09 PM  Result Value Ref Range Status  MRSA, PCR NEGATIVE NEGATIVE Final   Staphylococcus aureus NEGATIVE NEGATIVE Final    Comment: (NOTE) The Xpert SA Assay (FDA approved for NASAL specimens in patients 72 years of age and older), is one component of a comprehensive surveillance program. It is not intended to diagnose infection nor to guide or monitor treatment. Performed at [facility], [address] [place], [place] [address]   Aerobic/Anaerobic Culture (surgical/deep wound)     Status: None (Preliminary result)   Collection Time: 05/02/18  8:01 AM  Result Value Ref Range Status   Specimen Description   Final    ABSCESS Performed at [facility], [address] [place], [place] [address]    Special Requests   Final    NONE Performed at [facility], [address], [place], [place] [address]    Gram Stain   Final    NO WBC SEEN FEW GRAM POSITIVE COCCI IN PAIRS IN CLUSTERS Performed at [facility], [address] [place], [place] [address]    Culture   Final    ABUNDANT GROUP B STREP(S.AGALACTIAE)ISOLATED TESTING AGAINST S. AGALACTIAE NOT ROUTINELY PERFORMED  DUE TO PREDICTABILITY OF AMP/PEN/VAN SUSCEPTIBILITY. NO ANAEROBES ISOLATED; CULTURE IN PROGRESS FOR 5 DAYS    Report Status PENDING  Incomplete         Radiology Studies: No results found.      Scheduled Meds: . amLODipine  10 mg Oral Daily  . docusate sodium  100 mg Oral BID  . enoxaparin (LOVENOX) injection  55 mg Subcutaneous Q24H  . Influenza vac split quadrivalent PF  0.5 mL Intramuscular Tomorrow-1000  . insulin aspart  0-15 Units Subcutaneous TID WC  . insulin aspart  0-5 Units Subcutaneous QHS  . insulin aspart  5 Units Subcutaneous TID WC  . insulin glargine  16 Units Subcutaneous QHS  . polyethylene glycol  17 g Oral Daily   Continuous Infusions: . sodium chloride Stopped (05/03/18 0220)  . sodium chloride 100 mL/hr at 05/04/18 0855  . ampicillin-sulbactam (UNASYN) IV 3 g (05/04/18 1235)     LOS: 3 days    Time spent: 25 mins.More than 50% of that time was spent in counseling and/or coordination of care.      Burnadette Pop, MD [facility] Pager [phone]  If 7PM-7AM, please contact night-coverage [email or web address] Password [email or web address] 05/04/2018, 1:22 PM

## 2018-05-05 LAB — CBC WITH DIFFERENTIAL/PLATELET
Abs Immature Granulocytes: 0.31 10*3/uL — ABNORMAL HIGH (ref 0.00–0.07)
Basophils Absolute: 0.1 10*3/uL (ref 0.0–0.1)
Basophils Relative: 0 %
Eosinophils Absolute: 0.3 10*3/uL (ref 0.0–0.5)
Eosinophils Relative: 2 %
HCT: 34.8 % — ABNORMAL LOW (ref 39.0–52.0)
Hemoglobin: 10.8 g/dL — ABNORMAL LOW (ref 13.0–17.0)
Immature Granulocytes: 3 %
Lymphocytes Relative: 22 %
Lymphs Abs: 2.6 10*3/uL (ref 0.7–4.0)
MCH: 23.1 pg — ABNORMAL LOW (ref 26.0–34.0)
MCHC: 31 g/dL (ref 30.0–36.0)
MCV: 74.5 fL — ABNORMAL LOW (ref 80.0–100.0)
Monocytes Absolute: 0.7 10*3/uL (ref 0.1–1.0)
Monocytes Relative: 6 %
Neutro Abs: 7.9 10*3/uL — ABNORMAL HIGH (ref 1.7–7.7)
Neutrophils Relative %: 67 %
Platelets: 476 10*3/uL — ABNORMAL HIGH (ref 150–400)
RBC: 4.67 MIL/uL (ref 4.22–5.81)
RDW: 13.8 % (ref 11.5–15.5)
WBC: 11.9 10*3/uL — AB (ref 4.0–10.5)
nRBC: 0 % (ref 0.0–0.2)

## 2018-05-05 LAB — IRON AND TIBC
Iron: 50 ug/dL (ref 45–182)
Saturation Ratios: 20 % (ref 17.9–39.5)
TIBC: 245 ug/dL — ABNORMAL LOW (ref 250–450)
UIBC: 195 ug/dL

## 2018-05-05 LAB — BASIC METABOLIC PANEL
Anion gap: 7 (ref 5–15)
BUN: 11 mg/dL (ref 6–20)
CO2: 24 mmol/L (ref 22–32)
Calcium: 8.7 mg/dL — ABNORMAL LOW (ref 8.9–10.3)
Chloride: 108 mmol/L (ref 98–111)
Creatinine, Ser: 1.26 mg/dL — ABNORMAL HIGH (ref 0.61–1.24)
GFR calc Af Amer: >60 mL/min (ref >60)
GFR calc non Af Amer: >60 mL/min (ref >60)
GLUCOSE: 110 mg/dL — AB (ref 70–99)
Potassium: 3.6 mmol/L (ref 3.5–5.1)
Sodium: 139 mmol/L (ref 135–145)

## 2018-05-05 LAB — GLUCOSE, CAPILLARY
GLUCOSE-CAPILLARY: 98 mg/dL (ref 70–99)
Glucose-Capillary: 109 mg/dL — ABNORMAL HIGH (ref 70–99)

## 2018-05-05 LAB — FERRITIN: Ferritin: 391 ng/mL — ABNORMAL HIGH (ref 24–336)

## 2018-05-05 MED ORDER — BLOOD PRESSURE KIT: 1.0000 | PACK | Freq: Every day | 0 refills | Status: DC

## 2018-05-05 MED ORDER — AMLODIPINE BESYLATE 10 MG PO TABS: 10.0000 mg | ORAL_TABLET | Freq: Every day | ORAL | 0 refills | Status: DC

## 2018-05-05 MED ORDER — INSULIN ASPART 100 UNIT/ML FLEXPEN: 5.0000 [IU] | PEN_INJECTOR | Freq: Three times a day (TID) | SUBCUTANEOUS | 0 refills | Status: DC

## 2018-05-05 MED ORDER — HYDROCODONE-ACETAMINOPHEN 5-325 MG PO TABS: 1.0000 | ORAL_TABLET | ORAL | 0 refills | Status: DC | PRN

## 2018-05-05 MED ORDER — AMOXICILLIN-POT CLAVULANATE 875-125 MG PO TABS: 1.0000 | ORAL_TABLET | Freq: Two times a day (BID) | ORAL | 0 refills | Status: AC

## 2018-05-05 MED ORDER — HYDROCHLOROTHIAZIDE 50 MG PO TABS: 50.0000 mg | ORAL_TABLET | Freq: Every day | ORAL | 0 refills | Status: DC

## 2018-05-05 MED ORDER — INSULIN GLARGINE 100 UNIT/ML SOLOSTAR PEN: 16.0000 [IU] | PEN_INJECTOR | Freq: Every day | SUBCUTANEOUS | 0 refills | Status: DC

## 2018-05-05 MED ORDER — HYDROCHLOROTHIAZIDE 25 MG PO TABS
50.0000 mg | ORAL_TABLET | Freq: Every day | ORAL | Status: DC
  Administered 2018-05-05: 50 mg via ORAL
  Filled 2018-05-05: qty 2

## 2018-05-05 MED ORDER — BLOOD GLUCOSE METER KIT: PACK | 0 refills | Status: DC

## 2018-05-05 MED ORDER — INSULIN PEN NEEDLE 31G X 5 MM MISC: 1.0000 | Freq: Every day | 0 refills | Status: DC

## 2018-05-05 MED FILL — TRUEplus LANCETS 28G MISC: 25 days supply | Qty: 100 | Fill #0

## 2018-05-05 MED FILL — !LANTUS SOLOSTAR 100UNITS/M: 100 | 36 days supply | Qty: 6 | Fill #0

## 2018-05-05 MED FILL — AMOX-CLAV 875-125 MG TABLET: 875-125 | 5 days supply | Qty: 10 | Fill #0

## 2018-05-05 MED FILL — TRUEPLUS PEN NDL 31GX3/16: 31G X 5 MM | 30 days supply | Qty: 100 | Fill #0

## 2018-05-05 MED FILL — !TRUE METRIX BLOOD GLUCOSE: 30 days supply | Qty: 1 | Fill #0

## 2018-05-05 MED FILL — HYDROCHLOROTHIAZIDE 25 MG T: 25 | 30 days supply | Qty: 60 | Fill #0

## 2018-05-05 MED FILL — AMLODIPINE BESYLATE 10 MG T: 10 | 30 days supply | Qty: 30 | Fill #0

## 2018-05-05 MED FILL — NOVOLOG FLEXPEN SYRINGE: 100 | 20 days supply | Qty: 3 | Fill #0

## 2018-05-05 MED FILL — TRUE METRIX TEST STRIP: 25 days supply | Qty: 100 | Fill #0

## 2018-05-05 MED FILL — TRUEPLUS PEN NDL 31GX3/16": 31G X 5 MM | 30 days supply | Qty: 100 | Fill #0

## 2018-05-05 NOTE — Progress Notes (Signed)
Pt was discharged home today. Instructions were reviewed with patient, and questions were answered. Pt was taken to main entrance via wheelchair by NT.  

## 2018-05-05 NOTE — Progress Notes (Signed)
The patient is doing well this afternoon and denies scrotal pain/discomfort.  His wound is healing very nicely with no evidence of worsening/spreading infection along the scrotum or perineum.  He does continue to have serosanguineous drainage from the incision, which is expected after debridement and loose closure.  I am okay with him going home on a p.o. course of Augmentin and with close follow-up in 1 week in my office.  I stressed the importance of tight glucose control once he leaves the hospital.  He voices understanding.  Wrist for Liliane Shi, MD [facility]

## 2018-05-05 NOTE — Discharge Summary (Signed)
Physician Discharge Summary  Willie Spears NKN:[id number] DOB: [birth year] DOA: 05/01/2018  PCP: Marcial Pacas, DO  Admit date: 05/01/2018 Discharge date: 05/05/2018  Admitted From: Home Disposition:  Home  Discharge Condition:Stable CODE STATUS:FULL Diet recommendation:  Carb Modified   Brief/Interim Summary:  Patient is a 28 year old male with past medical history of diabetes mellitus type 2/uncontrolled who presents to the emergency department with complaints of worsening scrotal pain.  He was recently seen in the emergency department on 2/27 for a gluteal abscess which was drained and packed was discharged to home on oral antibiotics.  Patient reported that pain was not controlled.  Patient was also not taking any medications for his diabetes currently.  Reports 2-week history of worsening scrotal-perirectal abscess.  CT pelvis showed soft tissue density as well as air tracking in the perineal region.  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  Urology consulted and he underwent incision and drainage with debridement of the scrotal and perineal abscesses on 05/02/18. He is hemodynamically stable for discharge to home today on oral antibiotics.  Following problems were addressed during his hospitalization:  Fournier's gangrene/scrotal-perineal abscesses: Urology was folllowing.  Underwent incision and drainage with debridement.  Currently hemodynamically stable. Cultures showing Streptococcus agalactiae .Currently on unasyn.Will change antibiotics to Augmentin for 5 more days .Follow-up with urology as an outpatient in a week  Hypertension: Uncontrolled.  Not on medication at home before.  Started on amlodipine and hydrochlorothiazide.  Needs to monitor his blood pressure as an outpatient.  AKI: Creatinine of 1.4.    Improved with IV fluids.  Leucocytosis: Improved  Type 2 diabetes mellitus: Uncontrolled with hyperglycemia.  Not on medication at home.  Diabetic  coordinator consulted.  Started on insulin here.  Hemoglobin A1c of 12.2.  He was previously just on metformin at home.  He be on  insulin on discharge.   Microcytic anemia: Iron panel does not suggest iron deficiency anemia.  Follow-up CBC as an outpatient.  Obesity: BMI of 35.     Discharge Diagnoses:  Active Problems:   Scrotal abscess    Discharge Instructions  Discharge Instructions    Diet - low sodium heart healthy   Complete by:  As directed    Discharge instructions   Complete by:  As directed    1)Take prescribed medications as instructed. 2)Monitor your blood sugars and blood pressure at home. 3)Follow up at [facility] and wellness to see primary care physician. 4)Follow up with Dr. Lovena Neighbours, urology, in a week.   Increase activity slowly   Complete by:  As directed      Allergies as of 05/05/2018   No Known Allergies     Medication List    STOP taking these medications   BAYER CONTOUR NEXT TEST test strip Generic drug:  glucose blood   clindamycin 300 MG capsule Commonly known as:  CLEOCIN   Lancets 30G Misc   metFORMIN 1000 MG tablet Commonly known as:  GLUCOPHAGE     TAKE these medications   amLODipine 10 MG tablet Commonly known as:  NORVASC Take 1 tablet (10 mg total) by mouth daily. Start taking on:  May 06, 2018   amoxicillin-clavulanate 875-125 MG tablet Commonly known as:  AUGMENTIN Take 1 tablet by mouth 2 (two) times daily for 5 days.   blood glucose meter kit and supplies Dispense based on patient and insurance preference. Use up to four times daily as directed. (FOR ICD-10 E10.9, E11.9). What changed:  medication strength  additional instructions   Blood Pressure Kit 1 each by Does not apply route daily.   hydrochlorothiazide 50 MG tablet Commonly known as:  HYDRODIURIL Take 1 tablet (50 mg total) by mouth daily. Start taking on:  May 06, 2018   HYDROcodone-acetaminophen 5-325 MG tablet Commonly known as:   NORCO Take 1 tablet by mouth every 4 (four) hours as needed (for pain).   insulin aspart 100 UNIT/ML FlexPen Commonly known as:  NOVOLOG FLEXPEN Inject 5 Units into the skin 3 (three) times daily with meals for 30 days.   Insulin Glargine 100 UNIT/ML Solostar Pen Commonly known as:  LANTUS SOLOSTAR Inject 16 Units into the skin at bedtime for 30 days.   Insulin Pen Needle 31G X 5 MM Misc 1 each by Does not apply route daily.      Follow-up Information    [facility]. Schedule an appointment as soon as possible for a visit.   Why:  Can get meds @ [facility] pharmacy @ d/c. Contact information: [address], [address] [place] [address]       [name], MD In 1 week.   Specialty:  Urology Why:  My office will call to schedule this appointment Contact information: [address] [place] [address] [phone]          No Known Allergies  Consultations:  Urology   Procedures/Studies: Ct Pelvis W Contrast  Result Date: 05/01/2018 CLINICAL DATA:  Scrotal swelling and buttock abscess EXAM: CT PELVIS WITH CONTRAST TECHNIQUE: Multidetector CT imaging of the pelvis was performed using the standard protocol following the bolus administration of intravenous contrast. CONTRAST:  116m ISOVUE-300 IOPAMIDOL (ISOVUE-300) INJECTION 61% COMPARISON:  Prior ultrasound from 04/29/2018 FINDINGS: Urinary Tract:  Bladder is well distended. Bowel: Appendix is unremarkable. No obstructive or inflammatory changes of the bowel are seen. Vascular/Lymphatic: No pathologically enlarged lymph nodes. No significant vascular abnormality seen. Reproductive: Prostate is within normal limits. Considerable scrotal swelling and edema is noted similar to that seen on prior ultrasound examination. Air is noted along the medial aspect of the right buttock adjacent to the scrotum with air extending anteriorly into the base of the scrotum posteriorly.  Testicles appear within normal limits. No other focal abnormality is noted. Other:  None. Musculoskeletal: No suspicious bone lesions identified. IMPRESSION: Changes consistent with the known right buttock cellulitis, however there is extension of inflammatory change and mottled gas anteriorly towards the base of the scrotum. In total this area measures approximately 10 x 3.3 cm in greatest AP and craniocaudad projection. No discrete fluid collection is identified. Electronically Signed   By: MInez CatalinaM.D.   On: 05/01/2018 10:00   UKoreaScrotum  Result Date: 04/29/2018 CLINICAL DATA:  Palpable lump posterior to the right scrotum for 1 week. EXAM: ULTRASOUND OF SCROTUM TECHNIQUE: Complete ultrasound examination of the testicles, epididymis, and other scrotal structures was performed. COMPARISON:  None. FINDINGS: Right testicle Measurements: 4.8 x 2.8 x 3.8 cm. No mass or microlithiasis visualized. Testicular flow is demonstrated on color flow Doppler imaging. Left testicle Measurements: 4.8 x 2.8 x 3 cm. No mass or microlithiasis visualized. Testicular flow is demonstrated on color Doppler imaging. Right epididymis:  Normal in size and appearance. Left epididymis:  Normal in size and appearance. Hydrocele:  Small bilateral hydroceles are present. Varicocele:  Right varicocele is present. Imaging of the area of palpable lump posterior to the right scrotum demonstrates skin thickening with heterogeneous edematous  tissue. No significant increased flow. No loculated collection to suggest abscess. IMPRESSION: 1. Normal ultrasound appearance of the testicles. No evidence of testicular mass for torsion. 2. Small bilateral hydroceles. 3. Small right varicocele. 4. Skin thickening and edematous tissue corresponding to the palpable abnormality. Likely cellulitis. No abscess identified. Electronically Signed   By: Lucienne Capers M.D.   On: 04/29/2018 01:57       Subjective: Patient seen and examined the bedside  this morning.  Remains comfortable.  Mildly hypertensive.  Stable for discharge today to home.  Discharge Exam: Vitals:   05/05/18 0527 05/05/18 0624  BP: (!) 161/96 (!) 157/97  Pulse: 74 79  Resp: 18   Temp: 98.4 F (36.9 C)   SpO2: 95%    Vitals:   05/04/18 1306 05/04/18 2116 05/05/18 0527 05/05/18 0624  BP: (!) 171/91 (!) 162/95 (!) 161/96 (!) 157/97  Pulse: 66 68 74 79  Resp:  18 18   Temp: 97.8 F (36.6 C) 98.3 F (36.8 C) 98.4 F (36.9 C)   TempSrc: Oral Oral Oral   SpO2: 99% 98% 95%   Weight:      Height:        General: Pt is alert, awake, not in acute distress Cardiovascular: RRR, S1/S2 +, no rubs, no gallops Respiratory: CTA bilaterally, no wheezing, no rhonchi Abdominal: Soft, NT, ND, bowel sounds + Extremities: no edema, no cyanosis    The results of significant diagnostics from this hospitalization (including imaging, microbiology, ancillary and laboratory) are listed below for reference.     Microbiology: Recent Results (from the past 240 hour(s))  Surgical pcr screen     Status: None   Collection Time: 05/01/18  8:09 PM  Result Value Ref Range Status   MRSA, PCR NEGATIVE NEGATIVE Final   Staphylococcus aureus NEGATIVE NEGATIVE Final    Comment: (NOTE) The Xpert SA Assay (FDA approved for NASAL specimens in patients 4 years of age and older), is one component of a comprehensive surveillance program. It is not intended to diagnose infection nor to guide or monitor treatment. Performed at [facility], [place] [address], [place], [place] [address]   Aerobic/Anaerobic Culture (surgical/deep wound)     Status: None (Preliminary result)   Collection Time: 05/02/18  8:01 AM  Result Value Ref Range Status   Specimen Description   Final    ABSCESS Performed at [place] [address], [place], [place] [address]    Special Requests   Final    NONE Performed at [facility], [place] [address], [place], [place] [address]    Gram Stain   Final    NO WBC SEEN FEW GRAM POSITIVE COCCI IN PAIRS IN CLUSTERS Performed at [facility], [place] [address], [place], [place] [address]    Culture   Final    ABUNDANT GROUP B STREP(S.AGALACTIAE)ISOLATED TESTING AGAINST S. AGALACTIAE NOT ROUTINELY PERFORMED DUE TO PREDICTABILITY OF AMP/PEN/VAN SUSCEPTIBILITY. NO ANAEROBES ISOLATED; CULTURE IN PROGRESS FOR 5 DAYS    Report Status PENDING  Incomplete     Labs: BNP (last 3 results) No results for input(s): BNP in the last 8760 hours. Basic Metabolic Panel: Recent Labs  Lab 05/01/18 0821 05/01/18 1444 05/02/18 0425 05/03/18 1147 05/04/18 0506 05/05/18 0503  NA 132*  --  137  --  138 139  K 4.0  --  3.6  --  3.6 3.6  CL 94*  --  102  --  106 108  CO2 25  --  24  --  21* 24  GLUCOSE 447* 416* 197*  --  135* 110*  BUN 15  --  11  --  14 11  CREATININE 1.18  --  1.10 1.44* 1.42* 1.26*  CALCIUM 8.8*  --  8.7*  --  8.2* 8.7*   Liver Function Tests: Recent Labs  Lab 05/01/18 0821 05/02/18 0425  AST 13* 32  ALT 13 35  ALKPHOS 140* 190*  BILITOT 0.5 0.7  PROT 8.0 7.7  ALBUMIN 3.5 3.0*   No results for input(s): LIPASE, AMYLASE in the last 168 hours. No results for input(s): AMMONIA in the last 168 hours. CBC: Recent Labs  Lab 05/01/18 0821 05/02/18 0425 05/03/18 0404 05/04/18 0506 05/05/18 0503  WBC 20.4* 18.2* 15.0* 14.3* 11.9*  NEUTROABS 16.4*  --  10.9* 9.6* 7.9*  HGB 13.9 13.1 10.8* 10.3* 10.8*  HCT 44.6 41.2 33.7* 33.1* 34.8*  MCV 73.7* 73.8* 74.4* 74.7* 74.5*  PLT 489* 487* 429* 426* 476*   Cardiac Enzymes: No results for input(s): CKTOTAL, CKMB, CKMBINDEX, TROPONINI in the last 168 hours. BNP: Invalid input(s): POCBNP CBG: Recent Labs  Lab 05/04/18 1128 05/04/18 1640 05/04/18 2222 05/05/18 0830 05/05/18 1157  GLUCAP 128* 160* 107* 98 109*   D-Dimer No results for input(s): DDIMER in the last 72 hours. Hgb A1c No results for input(s): HGBA1C in  the last 72 hours. Lipid Profile No results for input(s): CHOL, HDL, LDLCALC, TRIG, CHOLHDL, LDLDIRECT in the last 72 hours. Thyroid function studies No results for input(s): TSH, T4TOTAL, T3FREE, THYROIDAB in the last 72 hours.  Invalid input(s): FREET3 Anemia work up Recent Labs    05/05/18 0503  FERRITIN 391*  TIBC 245*  IRON 50   Urinalysis    Component Value Date/Time   COLORURINE YELLOW 05/01/2018 [place] 05/01/2018 0821   LABSPEC 1.026 05/01/2018 0821   PHURINE 6.0 05/01/2018 0821   GLUCOSEU >=500 (A) 05/01/2018 0821   HGBUR NEGATIVE 05/01/2018 0821   BILIRUBINUR NEGATIVE 05/01/2018 0821   KETONESUR 5 (A) 05/01/2018 0821   PROTEINUR 30 (A) 05/01/2018 0821   NITRITE NEGATIVE 05/01/2018 0821   LEUKOCYTESUR NEGATIVE 05/01/2018 0821   Sepsis Labs Invalid input(s): PROCALCITONIN,  WBC,  LACTICIDVEN Microbiology Recent Results (from the past 240 hour(s))  Surgical pcr screen     Status: None   Collection Time: 05/01/18  8:09 PM  Result Value Ref Range Status   MRSA, PCR NEGATIVE NEGATIVE Final   Staphylococcus aureus NEGATIVE NEGATIVE Final    Comment: (NOTE) The Xpert SA Assay (FDA approved for NASAL specimens in patients 72 years of age and older), is one component of a comprehensive surveillance program. It is not intended to diagnose infection nor to guide or monitor treatment. Performed at [facility], [place] [address], [place], [place] [address]   Aerobic/Anaerobic Culture (surgical/deep wound)     Status: None (Preliminary result)   Collection Time: 05/02/18  8:01 AM  Result Value Ref Range Status   Specimen Description   Final    ABSCESS Performed at [place] [address], [place], [place] [address]    Special Requests   Final    NONE Performed at [facility], [place] [address] [place], [place] [address]    Gram Stain   Final    NO WBC SEEN FEW GRAM POSITIVE COCCI IN  PAIRS IN CLUSTERS Performed at [facility], [place] [address]  [place], [place] [address]    Culture   Final    ABUNDANT GROUP B STREP(S.AGALACTIAE)ISOLATED TESTING AGAINST S. AGALACTIAE NOT ROUTINELY PERFORMED DUE TO PREDICTABILITY OF AMP/PEN/VAN SUSCEPTIBILITY. NO ANAEROBES ISOLATED; CULTURE IN PROGRESS FOR 5 DAYS    Report Status PENDING  Incomplete    Please note: You were cared for by a hospitalist during your hospital stay. Once you are discharged, your primary care physician will handle any further medical issues. Please note that NO REFILLS for any discharge medications will be authorized once you are discharged, as it is imperative that you return to your primary care physician (or establish a relationship with a primary care physician if you do not have one) for your post hospital discharge needs so that they can reassess your need for medications and monitor your lab values.    Time coordinating discharge: 40 minutes  SIGNED:   [name], MD  [facility] 05/05/2018, 1:30 PM Pager [phone]  If 7PM-7AM, please contact night-coverage [email or web address] Password [email or web address]

## 2018-05-07 LAB — AEROBIC/ANAEROBIC CULTURE W GRAM STAIN (SURGICAL/DEEP WOUND): Gram Stain: NONE SEEN

## 2018-05-17 MED FILL — NOVOLOG FLEXPEN SYRINGE: 100 | 20 days supply | Qty: 3 | Fill #1

## 2018-05-30 NOTE — Progress Notes (Deleted)
   Subjective:    Patient ID: Willie Spears, male    DOB: 1990-04-28, 28 y.o.   MRN: [id number]  28 y.o.M with DM2 new onset, HgbA1C 12 seen for post hosp visit  Admit date: 05/01/2018 Discharge date: 05/05/2018  Admitted From: Home Disposition:  Home  Discharge Condition:Stable CODE STATUS:FULL Diet recommendation:  Carb Modified   Brief/Interim Summary:  Patient is a 28 year old male with past medical history of diabetes mellitus type 2/uncontrolled who presents to the emergency department with complaints of worsening scrotal pain. He was recently seen in the emergency department on 2/27 for a gluteal abscess which was drained and packed was discharged to home on oral antibiotics. Patient reported that pain was not controlled. Patient was also not taking any medications for his diabetes currently. Reports 2-week history of worsening scrotal-perirectal abscess. CT pelvis showed soft tissue density as well as air tracking in the perineal region. Found to have Fournier's gangrene of the scrotum and perineum. Urology consulted and he underwent incision and drainage with debridement of the scrotal and perineal abscesses on 05/02/18. He is hemodynamically stable for discharge to home today on oral antibiotics.  Following problems were addressed during his hospitalization:  Fournier's gangrene/scrotal-perineal abscesses: Urology was folllowing. Underwent incision and drainage with debridement. Currently hemodynamically stable. Cultures showing Streptococcus agalactiae .Currently onunasyn.Will change antibiotics to Augmentin for 5 more days .Follow-up with urology as an outpatient in a week  Hypertension: Uncontrolled.  Not on medication at home before.  Started on amlodipine and hydrochlorothiazide.  Needs to monitor his blood pressure as an outpatient.  AKI: Creatinine of 1.4.  Improved with IV fluids.  Leucocytosis: Improved  Type 2 diabetes mellitus: Uncontrolled with  hyperglycemia. Not on medication at home. Diabetic coordinator consulted. Started on insulin here. Hemoglobin A1c of 12.2. He was previously just on metformin at home. He be on  insulin on discharge.   Microcytic anemia: Iron panel does not suggest iron deficiency anemia.  Follow-up CBC as an outpatient.  Obesity: BMI of 35.       Review of Systems     Objective:   Physical Exam        Assessment & Plan:

## 2018-05-31 ENCOUNTER — Inpatient Hospital Stay: Payer: Self-pay | Admitting: Critical Care Medicine

## 2018-08-06 MED FILL — NOVOLOG FLEXPEN SYRINGE: 100 | 20 days supply | Qty: 3 | Fill #2

## 2018-11-09 ENCOUNTER — Encounter (HOSPITAL_COMMUNITY): Payer: Self-pay | Admitting: Emergency Medicine

## 2018-11-09 ENCOUNTER — Emergency Department (HOSPITAL_COMMUNITY)
Admission: EM | Admit: 2018-11-09 | Discharge: 2018-11-09 | Disposition: A | Discharge status: Home / Self Care | Payer: Self-pay | Attending: Emergency Medicine | Admitting: Emergency Medicine

## 2018-11-09 ENCOUNTER — Other Ambulatory Visit: Payer: Self-pay

## 2018-11-09 DIAGNOSIS — Z79899 Other long term (current) drug therapy: Secondary | ICD-10-CM | POA: Insufficient documentation

## 2018-11-09 DIAGNOSIS — E119 Type 2 diabetes mellitus without complications: Secondary | ICD-10-CM | POA: Insufficient documentation

## 2018-11-09 DIAGNOSIS — N3000 Acute cystitis without hematuria: Secondary | ICD-10-CM | POA: Insufficient documentation

## 2018-11-09 DIAGNOSIS — Z794 Long term (current) use of insulin: Secondary | ICD-10-CM | POA: Insufficient documentation

## 2018-11-09 DIAGNOSIS — Z202 Contact with and (suspected) exposure to infections with a predominantly sexual mode of transmission: Secondary | ICD-10-CM | POA: Insufficient documentation

## 2018-11-09 LAB — URINALYSIS, ROUTINE W REFLEX MICROSCOPIC
Bacteria, UA: NONE SEEN
Bilirubin Urine: NEGATIVE
Glucose, UA: >=500 mg/dL — AB
Hgb urine dipstick: NEGATIVE
Ketones, ur: NEGATIVE mg/dL
Nitrite: NEGATIVE
Protein, ur: 100 mg/dL — AB
Specific Gravity, Urine: 1.018 (ref 1.005–1.030)
pH: 5 (ref 5.0–8.0)

## 2018-11-09 MED ORDER — CEFTRIAXONE SODIUM 250 MG IJ SOLR
250.0000 mg | Freq: Once | INTRAMUSCULAR | Status: AC
  Administered 2018-11-09: 18:00:00 250 mg via INTRAMUSCULAR
  Filled 2018-11-09: qty 250

## 2018-11-09 MED ORDER — CEPHALEXIN 500 MG PO CAPS: 500.0000 mg | ORAL_CAPSULE | Freq: Three times a day (TID) | ORAL | 0 refills | Status: DC

## 2018-11-09 MED ORDER — STERILE WATER FOR INJECTION IJ SOLN
INTRAMUSCULAR | Status: AC
  Administered 2018-11-09: 18:00:00
  Filled 2018-11-09: qty 10

## 2018-11-09 MED ORDER — AZITHROMYCIN 250 MG PO TABS
1000.0000 mg | ORAL_TABLET | Freq: Once | ORAL | Status: AC
  Administered 2018-11-09: 18:00:00 1000 mg via ORAL
  Filled 2018-11-09: qty 4

## 2018-11-09 NOTE — ED Provider Notes (Signed)
[facility] DEPT Provider Note   CSN: [id number] Arrival date & time: 11/09/18  1430     History   Chief Complaint Chief Complaint  Patient presents with  . possible STD    HPI Willie Spears is a 28 y.o. male.     Patient is a 28 year old male with history of type 2 diabetes presenting with complaints of penile discharge.  He started with this complaint this morning.  He does describe some burning with urination, but denies any fevers, chills, or abdominal pain.  Patient is sexually active with the same partner for many years, however he does not believe that the relationship is monogamous from her side.  Patient states he was tested several weeks ago for STDs and was reported negative.  The history is provided by the patient.    Past Medical History:  Diagnosis Date  . Diabetes mellitus without complication [facility])     Patient Active Problem List   Diagnosis Date Noted  . Scrotal abscess 05/01/2018  . Type 2 diabetes mellitus ([place]) 03/29/2015  . Vitamin D deficiency 03/29/2015  . Fatigue 03/28/2015  . Family history of diabetes mellitus 03/28/2015    Past Surgical History:  Procedure Laterality Date  . IRRIGATION AND DEBRIDEMENT ABSCESS N/A 05/02/2018   Procedure: IRRIGATION AND DEBRIDEMENT ABSCESS scrotal;  Surgeon: [name], MD;  Location: [facility] ORS;  Service: Urology;  Laterality: N/A;        Home Medications    Prior to Admission medications   Medication Sig Start Date End Date Taking? Authorizing Provider  amLODipine (NORVASC) 10 MG tablet Take 1 tablet (10 mg total) by mouth daily. 05/06/18   [name], MD  blood glucose meter kit and supplies Dispense based on patient and insurance preference. Use up to four times daily as directed. (FOR ICD-10 E10.9, E11.9). 05/05/18   [name], MD  Blood Pressure KIT 1 each by Does not apply route daily. 05/05/18   [name], MD  hydrochlorothiazide (HYDRODIURIL) 50 MG  tablet Take 1 tablet (50 mg total) by mouth daily. 05/06/18   [name], MD  HYDROcodone-acetaminophen (NORCO) 5-325 MG tablet Take 1 tablet by mouth every 4 (four) hours as needed (for pain). 05/05/18   [name], MD  insulin aspart (NOVOLOG FLEXPEN) 100 UNIT/ML FlexPen Inject 5 Units into the skin 3 (three) times daily with meals for 30 days. 05/05/18 06/04/18  [name], MD  Insulin Glargine (LANTUS SOLOSTAR) 100 UNIT/ML Solostar Pen Inject 16 Units into the skin at bedtime for 30 days. 05/05/18 06/04/18  [name], MD  Insulin Pen Needle 31G X 5 MM MISC 1 each by Does not apply route daily. 05/05/18   [name], MD    Family History Family History  Problem Relation Age of Onset  . Diabetes Mother   . Diabetes Father     Social History Social History   Tobacco Use  . Smoking status: Never Smoker  . Smokeless tobacco: Never Used  Substance Use Topics  . Alcohol use: No  . Drug use: Yes    Types: Marijuana     Allergies   Patient has no known allergies.   Review of Systems Review of Systems  All other systems reviewed and are negative.    Physical Exam Updated Vital Signs BP (!) 167/115 (BP Location: Right Arm)   Pulse (!) 105   Temp 98.3 F (36.8 C) (Oral)   Resp 16   SpO2 97%   Physical Exam  Vitals signs and nursing note reviewed.  Constitutional:      General: He is not in acute distress.    Appearance: Normal appearance. He is not ill-appearing.  HENT:     Head: Normocephalic and atraumatic.  Pulmonary:     Effort: Pulmonary effort is normal.  Skin:    General: Skin is warm and dry.  Neurological:     Mental Status: He is alert and oriented to person, place, and time.      ED Treatments / Results  Labs (all labs ordered are listed, but only abnormal results are displayed) Labs Reviewed  URINALYSIS, ROUTINE W REFLEX MICROSCOPIC - Abnormal; Notable for the following components:      Result Value   Glucose, UA >=500 (*)     Protein, ur 100 (*)    Leukocytes,Ua TRACE (*)    All other components within normal limits  GC/CHLAMYDIA PROBE AMP ([place]) NOT AT [facility]    EKG None  Radiology No results found.  Procedures Procedures (including critical care time)  Medications Ordered in ED Medications  cefTRIAXone (ROCEPHIN) injection 250 mg (has no administration in time range)  azithromycin (ZITHROMAX) tablet 1,000 mg (has no administration in time range)     Initial Impression / Assessment and Plan / ED Course  I have reviewed the triage vital signs and the nursing notes.  Pertinent labs & imaging results that were available during my care of the patient were reviewed by me and considered in my medical decision making (see chart for details).  Patient will be treated presumptively for STDs with Rocephin and Zithromax.  His urine also is suggestive of infection.  There are trace leukocytes and 11-20 WBCs.  Whether this represents a UTI or STD I am uncertain.  I will treat for both pending culture results.  Final Clinical Impressions(s) / ED Diagnoses   Final diagnoses:  None    ED Discharge Orders    None       Veryl Speak, MD 11/09/18 1725

## 2018-11-09 NOTE — ED Triage Notes (Signed)
Per pt, states he woke up this am with penial discharge-doesn't know if he has a STD or a UTI-no dysuria-states he was tested 2 weeks ago and her "was clean"

## 2018-11-09 NOTE — Discharge Instructions (Signed)
Begin taking Keflex as prescribed.  We will call you if your cultures indicate you require further treatment or action.

## 2018-11-11 LAB — GC/CHLAMYDIA PROBE AMP (~~LOC~~) NOT AT ARMC
Chlamydia: POSITIVE — AB
Neisseria Gonorrhea: NEGATIVE

## 2019-03-18 ENCOUNTER — Emergency Department (HOSPITAL_COMMUNITY): Payer: Self-pay

## 2019-03-18 ENCOUNTER — Encounter (HOSPITAL_COMMUNITY): Payer: Self-pay | Admitting: Emergency Medicine

## 2019-03-18 ENCOUNTER — Other Ambulatory Visit: Payer: Self-pay

## 2019-03-18 ENCOUNTER — Emergency Department (HOSPITAL_COMMUNITY)
Admission: EM | Admit: 2019-03-18 | Discharge: 2019-03-18 | Disposition: A | Discharge status: Home / Self Care | Payer: Self-pay | Attending: Emergency Medicine | Admitting: Emergency Medicine

## 2019-03-18 DIAGNOSIS — U071 COVID-19: Secondary | ICD-10-CM | POA: Insufficient documentation

## 2019-03-18 DIAGNOSIS — Z79899 Other long term (current) drug therapy: Secondary | ICD-10-CM | POA: Insufficient documentation

## 2019-03-18 DIAGNOSIS — Z794 Long term (current) use of insulin: Secondary | ICD-10-CM | POA: Insufficient documentation

## 2019-03-18 DIAGNOSIS — E119 Type 2 diabetes mellitus without complications: Secondary | ICD-10-CM | POA: Insufficient documentation

## 2019-03-18 DIAGNOSIS — I10 Essential (primary) hypertension: Secondary | ICD-10-CM | POA: Insufficient documentation

## 2019-03-18 HISTORY — DX: Essential (primary) hypertension: I10

## 2019-03-18 MED ORDER — BENZONATATE 100 MG PO CAPS: 100.0000 mg | ORAL_CAPSULE | Freq: Three times a day (TID) | ORAL | 0 refills | Status: DC

## 2019-03-18 MED ORDER — ACETAMINOPHEN 500 MG PO TABS: 500.0000 mg | ORAL_TABLET | Freq: Four times a day (QID) | ORAL | 0 refills | Status: DC | PRN

## 2019-03-18 MED ORDER — ACETAMINOPHEN 500 MG PO TABS
1000.0000 mg | ORAL_TABLET | Freq: Once | ORAL | Status: AC
  Administered 2019-03-18: 1000 mg via ORAL
  Filled 2019-03-18: qty 2

## 2019-03-18 NOTE — ED Triage Notes (Signed)
Pt reports tested positive Covid last Monday. Reports over the diarrhea, body aches. Reports in shower this morning felt like couldn't catch his breath. Denies cough or SOb at this time.

## 2019-03-18 NOTE — ED Notes (Signed)
Pt ambulated around the room with pulse ox. No issues observed during ambulation. Pulse and o2sat both stayed in the 90s. Lowest o2 during ambulation was 94%. PA notified.

## 2019-03-18 NOTE — ED Provider Notes (Signed)
[facility] DEPT Provider Note   CSN: [id number] Arrival date & time: 03/18/19  1414     History Chief Complaint  Patient presents with  . Covid positive    Willie Spears is a 29 y.o. male.  The history is provided by the patient. No language interpreter was used.     29 year old male with history of hypertension, diabetes, previously tested positive for COVID-19 approximately 5 days ago presenting with complaints of shortness of breath.  Patient initially he developed persistent diarrhea, went tested for COVID-19 and tested positive.  Diarrhea has since improved.  Today while showering patient report feeling out of breath.  He was unsure if was related to COVID-19 or from the hot steam.  He was concerned prompting this ER visit.  However after resting for bed, his shortness of breath has since resolved.  He is now back to his baseline.  He denies any fever, headache, body aches, chest pain, productive cough, loss of taste or smell.  States that diarrhea has since improved as well.  No prior history of PE or DVT.  No hemoptysis.  He obtain his Covid test from [facility].  Past Medical History:  Diagnosis Date  . Diabetes mellitus without complication ([place])   . Hypertension     Patient Active Problem List   Diagnosis Date Noted  . Scrotal abscess 05/01/2018  . Type 2 diabetes mellitus ([facility]) 03/29/2015  . Vitamin D deficiency 03/29/2015  . Fatigue 03/28/2015  . Family history of diabetes mellitus 03/28/2015    Past Surgical History:  Procedure Laterality Date  . IRRIGATION AND DEBRIDEMENT ABSCESS N/A 05/02/2018   Procedure: IRRIGATION AND DEBRIDEMENT ABSCESS scrotal;  Surgeon: [name], MD;  Location: [facility];  Service: Urology;  Laterality: N/A;       Family History  Problem Relation Age of Onset  . Diabetes Mother   . Diabetes Father     Social History   Tobacco Use  . Smoking status: Never Smoker  . Smokeless tobacco: Never  Used  Substance Use Topics  . Alcohol use: No  . Drug use: Yes    Types: Marijuana    Home Medications Prior to Admission medications   Medication Sig Start Date End Date Taking? Authorizing Provider  amLODipine (NORVASC) 10 MG tablet Take 1 tablet (10 mg total) by mouth daily. Patient not taking: Reported on 03/18/2019 05/06/18   [name], MD  blood glucose meter kit and supplies Dispense based on patient and insurance preference. Use up to four times daily as directed. (FOR ICD-10 E10.9, E11.9). 05/05/18   [name], MD  Blood Pressure KIT 1 each by Does not apply route daily. 05/05/18   [name], MD  cephALEXin (KEFLEX) 500 MG capsule Take 1 capsule (500 mg total) by mouth 3 (three) times daily. Patient not taking: Reported on 03/18/2019 11/09/18   Veryl Speak, MD  hydrochlorothiazide (HYDRODIURIL) 50 MG tablet Take 1 tablet (50 mg total) by mouth daily. Patient not taking: Reported on 03/18/2019 05/06/18   [name], MD  HYDROcodone-acetaminophen (NORCO) 5-325 MG tablet Take 1 tablet by mouth every 4 (four) hours as needed (for pain). Patient not taking: Reported on 03/18/2019 05/05/18   [name], MD  insulin aspart (NOVOLOG FLEXPEN) 100 UNIT/ML FlexPen Inject 5 Units into the skin 3 (three) times daily with meals for 30 days. Patient not taking: Reported on 03/18/2019 05/05/18 06/04/18  [name], MD  Insulin Glargine (LANTUS SOLOSTAR) 100 UNIT/ML Solostar Pen Inject  16 Units into the skin at bedtime for 30 days. Patient not taking: Reported on 03/18/2019 05/05/18 06/04/18  Adhikari, Amrit, MD  Insulin Pen Needle 31G X 5 MM MISC 1 each by Does not apply route daily. 05/05/18   Adhikari, Amrit, MD    Allergies    Patient has no known allergies.  Review of Systems   Review of Systems  All other systems reviewed and are negative.   Physical Exam Updated Vital Signs BP (!) 137/93 (BP Location: Left Arm)   Pulse 87   Temp 99.3 F (37.4 C) (Oral)   Resp 16   SpO2  96%   Physical Exam Vitals and nursing note reviewed.  Constitutional:      General: He is not in acute distress.    Appearance: He is well-developed. He is obese.  HENT:     Head: Atraumatic.  Eyes:     Conjunctiva/sclera: Conjunctivae normal.  Cardiovascular:     Rate and Rhythm: Tachycardia present.     Pulses: Normal pulses.     Heart sounds: Normal heart sounds.  Pulmonary:     Effort: Pulmonary effort is normal.     Breath sounds: Normal breath sounds. No wheezing, rhonchi or rales.  Abdominal:     Palpations: Abdomen is soft.  Musculoskeletal:        General: No swelling.     Cervical back: Neck supple.  Skin:    Findings: No rash.  Neurological:     Mental Status: He is alert and oriented to person, place, and time.  Psychiatric:        Mood and Affect: Mood normal.     ED Results / Procedures / Treatments   Labs (all labs ordered are listed, but only abnormal results are displayed) Labs Reviewed - No data to display  EKG None  Radiology DG Chest Portable 1 View  Result Date: 03/18/2019 CLINICAL DATA:  Tested positive for COVID-19 five days ago. Reports cough and body aches. EXAM: PORTABLE CHEST 1 VIEW COMPARISON:  02/11/2018 FINDINGS: Focal opacity projects inferior and lateral to the right hilum, measuring 4 cm. This is new from the prior exam. There is a more subtle hazy opacity that projects in the right upper lobe. Remainder of the lungs is clear. No pleural effusion or pneumothorax. Heart, mediastinum and hila are unremarkable. Skeletal structures are intact. IMPRESSION: 1. 4 cm focal masslike hazy airspace opacity projecting inferior and lateral to the right hilum with a smaller more subtle area of hazy airspace opacity projecting in the right upper lobe. Given the patient's age and history, these areas are most likely due to multifocal COVID-19 infection. Recommend follow-up radiographs 4-6 weeks to reassess for clearing. 2. No other abnormality.  Electronically Signed   By: David  Ormond M.D.   On: 03/18/2019 16:47    Procedures Procedures (including critical care time)  Medications Ordered in ED Medications  acetaminophen (TYLENOL) tablet 1,000 mg (1,000 mg Oral Given 03/18/19 1612)    ED Course  I have reviewed the triage vital signs and the nursing notes.  Pertinent labs & imaging results that were available during my care of the patient were reviewed by me and considered in my medical decision making (see chart for details).    MDM Rules/Calculators/A&P                      BP (!) 141/99   Pulse (!) 108   Temp 99.3 F (37.4 C) (Oral)     Resp 17   SpO2 97%   Final Clinical Impression(s) / ED Diagnoses Final diagnoses:  COVID-19 virus infection    Rx / DC Orders ED Discharge Orders         Ordered    acetaminophen (TYLENOL) 500 MG tablet  Every 6 hours PRN     03/18/19 1741    benzonatate (TESSALON) 100 MG capsule  Every 8 hours     03/18/19 1741         4:11 PM Patient presents with complaints of shortness of breath.  Previously diagnosed with COVID-19 approximate 5 days ago.  Shortness of breath was transient while he was taking a hot shower and he is now back to his normal baseline.  On exam, he is resting comfortably, speaking in complete sentences, appears to be in no acute respiratory discomfort.  Lungs are clear to auscultation bilaterally.  No hypoxia.  6:14 PM Chest x-ray shows a 4 cm focal masslike hazy airspace opacity in the right hilum indicative of multifocal COVID-19 infection.  However, it is recommended that patient should have a repeat x-ray in 4 to 6 weeks to reassess and ensure clearance.  I discussed this with patient.  He is otherwise stable for discharge with symptomatic treatment.  Return precaution discussed.  No hypoxia warranting admission.  Low suspicion for PE.  BURTIS IMHOFF was evaluated in Emergency Department on 03/18/2019 for the symptoms described in the history of present  illness. He was evaluated in the context of the global COVID-19 pandemic, which necessitated consideration that the patient might be at risk for infection with the SARS-CoV-2 virus that causes COVID-19. Institutional protocols and algorithms that pertain to the evaluation of patients at risk for COVID-19 are in a state of rapid change based on information released by regulatory bodies including the CDC and federal and state organizations. These policies and algorithms were followed during the patient's care in the ED.    [name], PA-C 03/18/19 1851    Nat Christen, MD 03/19/19 1714

## 2019-03-18 NOTE — Discharge Instructions (Addendum)
Your chest xray shows signs of covid-19 infection.  It should resolve over time as your infection improves.  However, please have a repeat chest xray in 4-6 weeks to ensure resolution of lung infection. Take medications prescribed as needed.  Follow instruction below.  Return if you have any concerns.

## 2019-03-21 MED FILL — BENZONATATE 100 MG CAPS: 100 | 7 days supply | Qty: 21 | Fill #0

## 2019-12-30 ENCOUNTER — Other Ambulatory Visit: Payer: Self-pay | Admitting: Family Medicine

## 2019-12-30 ENCOUNTER — Encounter: Payer: Self-pay | Admitting: Family Medicine

## 2019-12-30 ENCOUNTER — Ambulatory Visit: Payer: Self-pay | Attending: Family Medicine | Admitting: Family Medicine

## 2019-12-30 ENCOUNTER — Other Ambulatory Visit: Payer: Self-pay

## 2019-12-30 VITALS — BP 139/95 | HR 90 | Temp 98.1°F | Ht 68.5 in | Wt 260.0 lb

## 2019-12-30 DIAGNOSIS — D509 Iron deficiency anemia, unspecified: Secondary | ICD-10-CM

## 2019-12-30 DIAGNOSIS — Z6838 Body mass index (BMI) 38.0-38.9, adult: Secondary | ICD-10-CM

## 2019-12-30 DIAGNOSIS — E1165 Type 2 diabetes mellitus with hyperglycemia: Secondary | ICD-10-CM

## 2019-12-30 DIAGNOSIS — I10 Essential (primary) hypertension: Secondary | ICD-10-CM

## 2019-12-30 LAB — POCT GLYCOSYLATED HEMOGLOBIN (HGB A1C): Hemoglobin A1C: 14.5 % — AB (ref 4.0–5.6)

## 2019-12-30 LAB — GLUCOSE, POCT (MANUAL RESULT ENTRY): POC Glucose: 298 mg/dL — AB (ref 70–99)

## 2019-12-30 MED ORDER — LOSARTAN POTASSIUM 50 MG PO TABS: 50.0000 mg | ORAL_TABLET | Freq: Every day | ORAL | 3 refills | Status: DC

## 2019-12-30 MED ORDER — INSULIN REGULAR HUMAN 100 UNIT/ML IJ SOLN: 10.0000 [IU] | Freq: Three times a day (TID) | INTRAMUSCULAR | 11 refills | Status: DC

## 2019-12-30 MED ORDER — LANTUS SOLOSTAR 100 UNIT/ML ~~LOC~~ SOPN: 15.0000 [IU] | PEN_INJECTOR | Freq: Every day | SUBCUTANEOUS | 99 refills | Status: DC

## 2019-12-30 MED ORDER — METFORMIN HCL 1000 MG PO TABS: 1000.0000 mg | ORAL_TABLET | Freq: Two times a day (BID) | ORAL | 1 refills | Status: DC

## 2019-12-30 MED FILL — !BASAGLAR 100 UNIT/ML KWIK: 100 | 20 days supply | Qty: 3 | Fill #0

## 2019-12-30 MED FILL — HumuLIN R 100 UNIT/ML SOLN: 100 | 31 days supply | Qty: 10 | Fill #0

## 2019-12-30 MED FILL — METFORMIN HCL 1000 MG TABS: 1000 | 30 days supply | Qty: 60 | Fill #0

## 2019-12-30 MED FILL — LOSARTAN POTASSIUM 50 MG TA: 50 | 30 days supply | Qty: 30 | Fill #0

## 2019-12-30 NOTE — Patient Instructions (Signed)
Diabetes Basics  Diabetes (diabetes mellitus) is a long-term (chronic) disease. It occurs when the body does not properly use sugar (glucose) that is released from food after you eat. Diabetes may be caused by one or both of these problems:  Your pancreas does not make enough of a hormone called insulin.  Your body does not react in a normal way to insulin that it makes. Insulin lets sugars (glucose) go into cells in your body. This gives you energy. If you have diabetes, sugars cannot get into cells. This causes high blood sugar (hyperglycemia). Follow these instructions at home: How is diabetes treated? You may need to take insulin or other diabetes medicines daily to keep your blood sugar in balance. Take your diabetes medicines every day as told by your doctor. List your diabetes medicines here: Diabetes medicines  Name of medicine: ______________________________ ? Amount (dose): _______________ Time (a.m./p.m.): _______________ Notes: ___________________________________  Name of medicine: ______________________________ ? Amount (dose): _______________ Time (a.m./p.m.): _______________ Notes: ___________________________________  Name of medicine: ______________________________ ? Amount (dose): _______________ Time (a.m./p.m.): _______________ Notes: ___________________________________ If you use insulin, you will learn how to give yourself insulin by injection. You may need to adjust the amount based on the food that you eat. List the types of insulin you use here: Insulin  Insulin type: ______________________________ ? Amount (dose): _______________ Time (a.m./p.m.): _______________ Notes: ___________________________________  Insulin type: ______________________________ ? Amount (dose): _______________ Time (a.m./p.m.): _______________ Notes: ___________________________________  Insulin type: ______________________________ ? Amount (dose): _______________ Time (a.m./p.m.):  _______________ Notes: ___________________________________  Insulin type: ______________________________ ? Amount (dose): _______________ Time (a.m./p.m.): _______________ Notes: ___________________________________  Insulin type: ______________________________ ? Amount (dose): _______________ Time (a.m./p.m.): _______________ Notes: ___________________________________ How do I manage my blood sugar?  Check your blood sugar levels using a blood glucose monitor as directed by your doctor. Your doctor will set treatment goals for you. Generally, you should have these blood sugar levels:  Before meals (preprandial): 80-130 mg/dL (4.4-7.2 mmol/L).  After meals (postprandial): below 180 mg/dL (10 mmol/L).  A1c level: less than 7%. Write down the times that you will check your blood sugar levels: Blood sugar checks  Time: _______________ Notes: ___________________________________  Time: _______________ Notes: ___________________________________  Time: _______________ Notes: ___________________________________  Time: _______________ Notes: ___________________________________  Time: _______________ Notes: ___________________________________  Time: _______________ Notes: ___________________________________  What do I need to know about low blood sugar? Low blood sugar is called hypoglycemia. This is when blood sugar is at or below 70 mg/dL (3.9 mmol/L). Symptoms may include:  Feeling: ? Hungry. ? Worried or nervous (anxious). ? Sweaty and clammy. ? Confused. ? Dizzy. ? Sleepy. ? Sick to your stomach (nauseous).  Having: ? A fast heartbeat. ? A headache. ? A change in your vision. ? Tingling or no feeling (numbness) around the mouth, lips, or tongue. ? Jerky movements that you cannot control (seizure).  Having trouble with: ? Moving (coordination). ? Sleeping. ? Passing out (fainting). ? Getting upset easily (irritability). Treating low blood sugar To treat low blood  sugar, eat or drink something sugary right away. If you can think clearly and swallow safely, follow the 15:15 rule:  Take 15 grams of a fast-acting carb (carbohydrate). Talk with your doctor about how much you should take.  Some fast-acting carbs are: ? Sugar tablets (glucose pills). Take 3-4 glucose pills. ? 6-8 pieces of hard candy. ? 4-6 oz (120-150 mL) of fruit juice. ? 4-6 oz (120-150 mL) of regular (not diet) soda. ? 1 Tbsp (15 mL) honey or sugar.    Check your blood sugar 15 minutes after you take the carb.  If your blood sugar is still at or below 70 mg/dL (3.9 mmol/L), take 15 grams of a carb again.  If your blood sugar does not go above 70 mg/dL (3.9 mmol/L) after 3 tries, get help right away.  After your blood sugar goes back to normal, eat a meal or a snack within 1 hour. Treating very low blood sugar If your blood sugar is at or below 54 mg/dL (3 mmol/L), you have very low blood sugar (severe hypoglycemia). This is an emergency. Do not wait to see if the symptoms will go away. Get medical help right away. Call your local emergency services (911 in the [place] Do not drive yourself to the hospital. Questions to ask your health care provider  Do I need to meet with a diabetes educator?  What equipment will I need to care for myself at home?  What diabetes medicines do I need? When should I take them?  How often do I need to check my blood sugar?  What number can I call if I have questions?  When is my next doctor's visit?  Where can I find a support group for people with diabetes? Where to find more information  American Diabetes Association: www.diabetes.org  American Association of Diabetes Educators: www.diabeteseducator.org/patient-resources Contact a doctor if:  Your blood sugar is at or above 240 mg/dL (84.6 mmol/L) for 2 days in a row.  You have been sick or have had a fever for 2 days or more, and you are not getting better.  You have any of these  problems for more than 6 hours: ? You cannot eat or drink. ? You feel sick to your stomach (nauseous). ? You throw up (vomit). ? You have watery poop (diarrhea). Get help right away if:  Your blood sugar is lower than 54 mg/dL (3 mmol/L).  You get confused.  You have trouble: ? Thinking clearly. ? Breathing. Summary  Diabetes (diabetes mellitus) is a long-term (chronic) disease. It occurs when the body does not properly use sugar (glucose) that is released from food after digestion.  Take insulin and diabetes medicines as told.  Check your blood sugar every day, as often as told.  Keep all follow-up visits as told by your doctor. This is important. This information is not intended to replace advice given to you by your health care provider. Make sure you discuss any questions you have with your health care provider. Document Revised: 11/10/2018 Document Reviewed: 05/22/2017 Elsevier Patient Education  2020 [employer] Inc[employer]  Diabetes Mellitus and Standards of Medical Care Managing diabetes (diabetes mellitus) can be complicated. Your diabetes treatment may be managed by a team of health care providers, including:  A physician who specializes in diabetes (endocrinologist).  A nurse practitioner or physician assistant.  Nurses.  A diet and nutrition specialist (registered dietitian).  A certified diabetes educator (CDE).  An exercise specialist.  A pharmacist.  An eye doctor.  A foot specialist (podiatrist).  A dentist.  A primary care provider.  A mental health provider. Your health care providers follow guidelines to help you get the best quality of care. The following schedule is a general guideline for your diabetes management plan. Your health care providers may give you more specific instructions. Physical exams Upon being diagnosed with diabetes mellitus, and each year after that, your health care provider will ask about your medical and family history. He or  she will also do a  physical exam. Your exam may include:  Measuring your height, weight, and body mass index (BMI).  Checking your blood pressure. This will be done at every routine medical visit. Your target blood pressure may vary depending on your medical conditions, your age, and other factors.  Thyroid gland exam.  Skin exam.  Screening for damage to your nerves (peripheral neuropathy). This may include checking the pulse in your legs and feet and checking the level of sensation in your hands and feet.  A complete foot exam to inspect the structure and skin of your feet, including checking for cuts, bruises, redness, blisters, sores, or other problems.  Screening for blood vessel (vascular) problems, which may include checking the pulse in your legs and feet and checking your temperature. Blood tests Depending on your treatment plan and your personal needs, you may have the following tests done:  HbA1c (hemoglobin A1c). This test provides information about blood sugar (glucose) control over the previous 2-3 months. It is used to adjust your treatment plan, if needed. This test will be done: ? At least 2 times a year, if you are meeting your treatment goals. ? 4 times a year, if you are not meeting your treatment goals or if treatment goals have changed.  Lipid testing, including total, LDL, and HDL cholesterol and triglyceride levels. ? The goal for LDL is less than 100 mg/dL (5.5 mmol/L). If you are at high risk for complications, the goal is less than 70 mg/dL (3.9 mmol/L). ? The goal for HDL is 40 mg/dL (2.2 mmol/L) or higher for men and 50 mg/dL (2.8 mmol/L) or higher for women. An HDL cholesterol of 60 mg/dL (3.3 mmol/L) or higher gives some protection against heart disease. ? The goal for triglycerides is less than 150 mg/dL (8.3 mmol/L).  Liver function tests.  Kidney function tests.  Thyroid function tests. Dental and eye exams  Visit your dentist two times a  year.  If you have type 1 diabetes, your health care provider may recommend an eye exam 3-5 years after you are diagnosed, and then once a year after your first exam. ? For children with type 1 diabetes, a health care provider may recommend an eye exam when your child is age 50 or older and has had diabetes for 3-5 years. After the first exam, your child should get an eye exam once a year.  If you have type 2 diabetes, your health care provider may recommend an eye exam as soon as you are diagnosed, and then once a year after your first exam. Immunizations   The yearly flu (influenza) vaccine is recommended for everyone 6 months or older who has diabetes.  The pneumonia (pneumococcal) vaccine is recommended for everyone 2 years or older who has diabetes. If you are 46 or older, you may get the pneumonia vaccine as a series of two separate shots.  The hepatitis B vaccine is recommended for adults shortly after being diagnosed with diabetes.  Adults and children with diabetes should receive all other vaccines according to age-specific recommendations from the Centers for Disease Control and Prevention (CDC). Mental and emotional health Screening for symptoms of eating disorders, anxiety, and depression is recommended at the time of diagnosis and afterward as needed. If your screening shows that you have symptoms (positive screening result), you may need more evaluation and you may work with a mental health care provider. Treatment plan Your treatment plan will be reviewed at every medical visit. You and your health care provider  will discuss:  How you are taking your medicines, including insulin.  Any side effects you are experiencing.  Your blood glucose target goals.  The frequency of your blood glucose monitoring.  Lifestyle habits, such as activity level as well as tobacco, alcohol, and substance use. Diabetes self-management education Your health care provider will assess how well  you are monitoring your blood glucose levels and whether you are taking your insulin correctly. He or she may refer you to:  A certified diabetes educator to manage your diabetes throughout your life, starting at diagnosis.  A registered dietitian who can create or review your personal nutrition plan.  An exercise specialist who can discuss your activity level and exercise plan. Summary  Managing diabetes (diabetes mellitus) can be complicated. Your diabetes treatment may be managed by a team of health care providers.  Your health care providers follow guidelines in order to help you get the best quality of care.  Standards of care including having regular physical exams, blood tests, blood pressure monitoring, immunizations, screening tests, and education about how to manage your diabetes.  Your health care providers may also give you more specific instructions based on your individual health. This information is not intended to replace advice given to you by your health care provider. Make sure you discuss any questions you have with your health care provider. Document Revised: 11/06/2017 Document Reviewed: 11/16/2015 Elsevier Patient Education  2020 [employer].

## 2019-12-30 NOTE — Progress Notes (Signed)
New Patient Office Visit  Subjective:  Patient ID: Willie Spears, male    DOB: 03-Nov-1990  Age: 29 y.o. MRN: [id number]  CC: No chief complaint on file.   HPI Willie Spears , 29 year old male new to the practice with history of hypertension, diabetes and status post COVID-19 infection in January 2021.  Review of chart, patient also with microcytic anemia.  Patient reports that he has been out of medication for treatment of diabetes for about a year.  He reports that previously he did well on Lantus and NovoLog taking 15 units of Lantus at night and 10 units of NovoLog before meals.  He does have current issues with increased thirst and frequent urination.  He denies any numbness or tingling in the hands or feet and no changes in his vision or blurred vision.  He has not been on medication for blood pressure in over a year.  He denies any headaches or dizziness. He currently smokes about 2-3 cigarettes per day and is employed as a Dealer.   Past Medical History:  Diagnosis Date  . Diabetes mellitus without complication ([place])   . Hypertension     Past Surgical History:  Procedure Laterality Date  . IRRIGATION AND DEBRIDEMENT ABSCESS N/A 05/02/2018   Procedure: IRRIGATION AND DEBRIDEMENT ABSCESS scrotal;  Surgeon: [name], MD;  Location: [facility];  Service: Urology;  Laterality: N/A;    Family History  Problem Relation Age of Onset  . Diabetes Mother   . Diabetes Father     Social History   Socioeconomic History  . Marital status: Single    Spouse name: Not on file  . Number of children: Not on file  . Years of education: Not on file  . Highest education level: Not on file  Occupational History  . Not on file  Tobacco Use  . Smoking status: Never Smoker  . Smokeless tobacco: Never Used  Vaping Use  . Vaping Use: Never used  Substance and Sexual Activity  . Alcohol use: No  . Drug use: Yes    Types: Marijuana  . Sexual activity: Yes  Other Topics Concern    . Not on file  Social History Narrative  . Not on file   Social Determinants of Health   Financial Resource Strain:   . Difficulty of Paying Living Expenses: Not on file  Food Insecurity:   . Worried About Charity fundraiser in the Last Year: Not on file  . Ran Out of Food in the Last Year: Not on file  Transportation Needs:   . Lack of Transportation (Medical): Not on file  . Lack of Transportation (Non-Medical): Not on file  Physical Activity:   . Days of Exercise per Week: Not on file  . Minutes of Exercise per Session: Not on file  Stress:   . Feeling of Stress : Not on file  Social Connections:   . Frequency of Communication with Friends and Family: Not on file  . Frequency of Social Gatherings with Friends and Family: Not on file  . Attends Religious Services: Not on file  . Active Member of Clubs or Organizations: Not on file  . Attends Archivist Meetings: Not on file  . Marital Status: Not on file  Intimate Partner Violence:   . Fear of Current or Ex-Partner: Not on file  . Emotionally Abused: Not on file  . Physically Abused: Not on file  . Sexually Abused: Not on file  ROS Review of Systems  Constitutional: Negative for chills and fever.  HENT: Negative for sore throat and trouble swallowing.   Eyes: Negative for photophobia and visual disturbance.       Wears glasses  Respiratory: Negative for cough and shortness of breath.   Cardiovascular: Negative for chest pain, palpitations and leg swelling.  Gastrointestinal: Negative for abdominal pain, constipation, diarrhea and nausea.  Endocrine: Positive for polydipsia, polyphagia and polyuria.  Genitourinary: Positive for frequency. Negative for dysuria.  Musculoskeletal: Negative for arthralgias and back pain.  Skin: Negative for rash and wound.  Neurological: Negative for dizziness, numbness and headaches.  Hematological: Negative for adenopathy. Does not bruise/bleed easily.    Objective:    Today's Vitals: BP (!) 139/95 (BP Location: Right Arm, Patient Position: Sitting)   Pulse 90   Temp 98.1 F (36.7 C)   Ht 5' 8.5" (1.74 m)   Wt 260 lb (117.9 kg)   SpO2 99%   BMI 38.95 kg/m   Physical Exam Vitals and nursing note reviewed.  Constitutional:      General: He is not in acute distress.    Appearance: Normal appearance.  Neck:     Vascular: No carotid bruit.  Cardiovascular:     Rate and Rhythm: Normal rate and regular rhythm.     Pulses:          Dorsalis pedis pulses are 1+ on the right side and 1+ on the left side.       Posterior tibial pulses are 1+ on the right side and 1+ on the left side.  Pulmonary:     Effort: Pulmonary effort is normal.     Breath sounds: Normal breath sounds.  Abdominal:     Palpations: Abdomen is soft.     Tenderness: There is no abdominal tenderness. There is no right CVA tenderness, left CVA tenderness, guarding or rebound.  Musculoskeletal:        General: No tenderness.     Cervical back: Normal range of motion and neck supple. No rigidity or tenderness.     Right lower leg: No edema.     Left lower leg: No edema.     Right foot: Normal range of motion. No deformity.     Left foot: Normal range of motion. No deformity.  Feet:     Right foot:     Protective Sensation: 10 sites tested. 10 sites sensed.     Skin integrity: Skin integrity normal.     Toenail Condition: Right toenails are normal.     Left foot:     Protective Sensation: 10 sites tested. 10 sites sensed.     Skin integrity: Skin integrity normal.     Toenail Condition: Left toenails are normal.  Lymphadenopathy:     Cervical: No cervical adenopathy.  Skin:    General: Skin is warm and dry.  Neurological:     General: No focal deficit present.     Mental Status: He is alert and oriented to person, place, and time.  Psychiatric:        Mood and Affect: Mood normal.        Behavior: Behavior normal.     Assessment & Plan:  1. Type 2 diabetes mellitus  with hyperglycemia, without long-term current use of insulin ([place]) Patient has been out of medications for about a year and his Hgb A1c at today's visit was 14.5 and glucose of 298.  He reports that he has not had anything to eat so  far today.  Discussed the importance of being compliant with medication for the treatment of diabetes to help avoid long-term complications such as neuropathy, nephropathy and retinopathy.  He was encouraged to have a yearly diabetic eye exam.  Diabetic foot care also discussed.  He will have comprehensive metabolic panel and lipid panel at today's visit.  He is being restarted on Lantus at 15 units once daily and premeal Novolin at 10 units premeal.  Discussed low carbohydrate diet.  Educational material on diabetes provided as part of after visit summary.  He reports that he does have a glucometer at home and he has been asked to start monitoring his home blood sugars and keep a blood sugar diary and bring this to an upcoming appointment with clinical pharmacist to see if his blood sugar control is improving or further medication adjustments need to be made.  He has also been asked to restart the use of Metformin 1000 mg twice daily.  Discussed with patient that statin medication is recommended for diabetic patients to help reduce the risk of cardiovascular disease associated with diabetes and he will be notified regarding recommendations for statin therapy based on today's lipid panel. Complete smoking cessation also encouraged. - POCT glucose (manual entry) - POCT glycosylated hemoglobin (Hb A1C) - Comprehensive metabolic panel - Lipid panel - insulin glargine (LANTUS SOLOSTAR) 100 UNIT/ML Solostar Pen; Inject 15 Units into the skin daily.  Dispense: 15 mL; Refill: PRN - insulin regular (NOVOLIN R) 100 units/mL injection; Inject 0.1 mLs (10 Units total) into the skin 3 (three) times daily before meals.  Dispense: 10 mL; Refill: 11 - metFORMIN (GLUCOPHAGE) 1000 MG tablet; Take  1 tablet (1,000 mg total) by mouth 2 (two) times daily with a meal.  Dispense: 180 tablet; Refill: 1  2. Essential hypertension Patient's blood pressure is elevated at today's visit and he will be started on losartan 50 mg and will have blood pressure recheck in a few weeks when he follows up with the clinical pharmacist.  He will also have lipid panel at today's visit. - Lipid panel - losartan (COZAAR) 50 MG tablet; Take 1 tablet (50 mg total) by mouth daily. To lower blood pressure  Dispense: 30 tablet; Refill: 3  3. Iron deficiency anemia, unspecified iron deficiency anemia type On review of patient's chart, patient has had prior anemia during hospitalization for scrotal abscess.  He will have recheck of CBC at today's visit. - CBC  4. Class 2 severe obesity with serious comorbidity and body mass index (BMI) of 38.0 to 38.9 in adult, unspecified obesity type (H[facility]) Low carbohydrate diet and exercise with goal weight loss encouraged.  Lipid panel and comprehensive metabolic panel at today's visit. - Lipid panel   Outpatient Encounter Medications as of 12/30/2019  Medication Sig  . acetaminophen (TYLENOL) 500 MG tablet Take 1 tablet (500 mg total) by mouth every 6 (six) hours as needed.  . benzonatate (TESSALON) 100 MG capsule Take 1 capsule (100 mg total) by mouth every 8 (eight) hours.  . blood glucose meter kit and supplies Dispense based on patient and insurance preference. Use up to four times daily as directed. (FOR ICD-10 E10.9, E11.9).  Marland Kitchen Blood Pressure KIT 1 each by Does not apply route daily.  . Insulin Pen Needle 31G X 5 MM MISC 1 each by Does not apply route daily.  . [DISCONTINUED] amLODipine (NORVASC) 10 MG tablet Take 1 tablet (10 mg total) by mouth daily. (Patient not taking: Reported on 03/18/2019)  . [  DISCONTINUED] hydrochlorothiazide (HYDRODIURIL) 50 MG tablet Take 1 tablet (50 mg total) by mouth daily. (Patient not taking: Reported on 03/18/2019)  . [DISCONTINUED] insulin  aspart (NOVOLOG FLEXPEN) 100 UNIT/ML FlexPen Inject 5 Units into the skin 3 (three) times daily with meals for 30 days. (Patient not taking: Reported on 03/18/2019)  . [DISCONTINUED] Insulin Glargine (LANTUS SOLOSTAR) 100 UNIT/ML Solostar Pen Inject 16 Units into the skin at bedtime for 30 days. (Patient not taking: Reported on 03/18/2019)   No facility-administered encounter medications on file as of 12/30/2019.    Follow-up: Return in about 3 months (around 03/31/2020) for DM/HTN; 2-3 week f/u with Luke/CPP regarding DM/BP.   [name], MD

## 2019-12-30 NOTE — Progress Notes (Signed)
novolog and lantus is also med he needs has not been taking  Out of BP meds

## 2019-12-31 LAB — COMPREHENSIVE METABOLIC PANEL WITH GFR
ALT: 14 IU/L (ref 0–44)
AST: 12 IU/L (ref 0–40)
Albumin/Globulin Ratio: 1.7 (ref 1.2–2.2)
Albumin: 4.6 g/dL (ref 4.1–5.2)
Alkaline Phosphatase: 88 IU/L (ref 44–121)
BUN/Creatinine Ratio: 8 — ABNORMAL LOW (ref 9–20)
BUN: 11 mg/dL (ref 6–20)
Bilirubin Total: 0.4 mg/dL (ref 0.0–1.2)
CO2: 25 mmol/L (ref 20–29)
Calcium: 10 mg/dL (ref 8.7–10.2)
Chloride: 97 mmol/L (ref 96–106)
Creatinine, Ser: 1.33 mg/dL — ABNORMAL HIGH (ref 0.76–1.27)
GFR calc Af Amer: 84 mL/min/1.73
GFR calc non Af Amer: 72 mL/min/1.73
Globulin, Total: 2.7 g/dL (ref 1.5–4.5)
Glucose: 313 mg/dL — ABNORMAL HIGH (ref 65–99)
Potassium: 4.6 mmol/L (ref 3.5–5.2)
Sodium: 137 mmol/L (ref 134–144)
Total Protein: 7.3 g/dL (ref 6.0–8.5)

## 2019-12-31 LAB — LIPID PANEL
Chol/HDL Ratio: 3.6 ratio (ref 0.0–5.0)
Cholesterol, Total: 221 mg/dL — ABNORMAL HIGH (ref 100–199)
HDL: 61 mg/dL
LDL Chol Calc (NIH): 133 mg/dL — ABNORMAL HIGH (ref 0–99)
Triglycerides: 155 mg/dL — ABNORMAL HIGH (ref 0–149)
VLDL Cholesterol Cal: 27 mg/dL (ref 5–40)

## 2019-12-31 LAB — CBC
Hematocrit: 49 % (ref 37.5–51.0)
Hemoglobin: 15.8 g/dL (ref 13.0–17.7)
MCH: 24 pg — ABNORMAL LOW (ref 26.6–33.0)
MCHC: 32.2 g/dL (ref 31.5–35.7)
MCV: 75 fL — ABNORMAL LOW (ref 79–97)
Platelets: 320 x10E3/uL (ref 150–450)
RBC: 6.58 x10E6/uL — ABNORMAL HIGH (ref 4.14–5.80)
RDW: 13.6 % (ref 11.6–15.4)
WBC: 7 x10E3/uL (ref 3.4–10.8)

## 2020-01-13 ENCOUNTER — Other Ambulatory Visit: Payer: Self-pay | Admitting: Family Medicine

## 2020-01-13 ENCOUNTER — Other Ambulatory Visit: Payer: Self-pay

## 2020-01-13 ENCOUNTER — Ambulatory Visit: Payer: Self-pay | Attending: Family Medicine | Admitting: Pharmacist

## 2020-01-13 ENCOUNTER — Encounter: Payer: Self-pay | Admitting: Pharmacist

## 2020-01-13 VITALS — BP 136/90 | HR 91

## 2020-01-13 DIAGNOSIS — E1165 Type 2 diabetes mellitus with hyperglycemia: Secondary | ICD-10-CM

## 2020-01-13 LAB — GLUCOSE, POCT (MANUAL RESULT ENTRY): POC Glucose: 296 mg/dL — AB (ref 70–99)

## 2020-01-13 MED ORDER — METFORMIN HCL ER 500 MG PO TB24: 2000.0000 mg | ORAL_TABLET | Freq: Every day | ORAL | 2 refills | Status: DC

## 2020-01-13 MED ORDER — TRUEPLUS LANCETS 28G MISC: 2 refills | Status: DC

## 2020-01-13 MED ORDER — TRULICITY 0.75 MG/0.5ML ~~LOC~~ SOAJ: 0.7500 mg | SUBCUTANEOUS | 0 refills | Status: DC

## 2020-01-13 MED ORDER — TRUE METRIX BLOOD GLUCOSE TEST VI STRP: ORAL_STRIP | 2 refills | Status: DC

## 2020-01-13 MED ORDER — TRUE METRIX METER W/DEVICE KIT: PACK | 0 refills | Status: DC

## 2020-01-13 MED FILL — TRUE METRIX TEST STRIP: 30 days supply | Qty: 100 | Fill #0

## 2020-01-13 MED FILL — metFORMIN HCL ER 500 MG TB2: 500 | 30 days supply | Qty: 120 | Fill #0

## 2020-01-13 MED FILL — !TRULICITY 0.75 MG/0.5 ML P: 0.75 | 30 days supply | Qty: 2 | Fill #0

## 2020-01-13 MED FILL — TRUEplus LANCETS 28G MISC: 30 days supply | Qty: 100 | Fill #0

## 2020-01-13 NOTE — Progress Notes (Signed)
See students note.

## 2020-01-13 NOTE — Progress Notes (Addendum)
    S:     No chief complaint on file.  Patient arrives in good spirits.  Presents for diabetes evaluation, education, and management Patient was referred and last seen by Primary Care Provider on 12/30/2019.   Patient reports Diabetes was diagnosed in 2017.   Family/Social History:  - FHx: DM (mother & father) - Tobacco: 3-4 cigarettes/day  Insurance coverage/medication affordability: uninsured  Patient is not adherent with medications. Current diabetes medications include: -Metformin 1000 mg BID (actually taking once daily because he only eats one meal) -Novolin R 10 units TID with meals (actually taking once daily with single meal) -Lantus 15 once daily at night (Often forgets evening medications) Current hypertension medications include: losartan 50 mg daily- he is adherent with this medication Current hyperlipidemia medications include: none  Patient denies hypoglycemic events.  Patient reported dietary habits: Eats 1 meal/day Admits to dietary indiscretions and enjoys drinking juice   Patient-reported exercise habits: None outside of work   Patient denies nocturia (nighttime urination).  Patient denies neuropathy (nerve pain). Patient denies visual changes.    O:  POCT: 296  Vitals:   01/13/20 1501  BP: 136/90  Pulse: 91    Lab Results  Component Value Date   HGBA1C 14.5 (A) 12/30/2019   There were no vitals filed for this visit.  Lipid Panel     Component Value Date/Time   CHOL 221 (H) 12/30/2019 1454   TRIG 155 (H) 12/30/2019 1454   HDL 61 12/30/2019 1454   CHOLHDL 3.6 12/30/2019 1454   LDLCALC 133 (H) 12/30/2019 1454    Home fasting blood sugars: does not check 2 hour post-meal/random blood sugars: does not check. Does not check blood pressure at home  Clinical Atherosclerotic Cardiovascular Disease (ASCVD): No  The ASCVD Risk score Denman George DC Jr., et al., 2013) failed to calculate for the following reasons:   The 2013 ASCVD risk score is  only valid for ages 78 to 43    A/P: Diabetes diagnosed in 2017 currently uncontrolled. Patient is able to verbalize appropriate hypoglycemia management plan. Not adherent to most medications. Control is suboptimal due to medication nonadherence. -Continue Lantus 15 units daily in the morning -Discontinue Novolin R  -Discontinue metformin -Start taking metformin XR 500 mg- 4 tablets daily with largest meal -Start taking Trulicity 0.75 mg once weekly -Ordered microalbumin/creatinine ratio -Extensively discussed pathophysiology of diabetes, recommended lifestyle interventions, dietary effects on blood sugar control -Counseled on s/sx of and management of hypoglycemia -Next A1C anticipated 03/2020  ASCVD risk - Patient is less than 75 years old. Last LDL 133 on 12/30/19. Statin not indicated at this time.  Hypertension longstanding currently uncontrolled.  Blood pressure goal < 130/80 mmHg. Medication adherence is appropriate. Losartan was started 2 weeks ago. Patient may see additional blood pressure lowering over the next few weeks. -Continue losartan 50 mg daily. Consider dose increase at next visit if blood pressure is still uncontrolled.  Written patient instructions provided.  Total time in face to face counseling 15 minutes.   Follow up Pharmacist Clinic Visit in 2 weeks.   Arletha Pili, PharmD Candidate [employer], [employer] of 2022  Georgiana Shore [place], PharmD, CPP Clinical Pharmacist [facility] & [facility] [phone]

## 2020-01-13 NOTE — Patient Instructions (Signed)
Thank you for coming to see me today. Please do the following:  1. Stop Novolin R.  2. Stop regular metformin. 3. Start extended release metformin. Start taking 4 tablets after your largest meal.  4. Start taking Lantus in the mornings. Take 15 units every morning.  5. Start taking Trulicity. Inject once a week.  6. Start checking blood sugars at home. 7. Continue making the lifestyle changes we've discussed together during our visit. Diet and exercise play a significant role in improving your blood sugars.  8. Follow-up with me in 2 weeks.    Hypoglycemia or low blood sugar:   Low blood sugar can happen quickly and may become an emergency if not treated right away.   While this shouldn't happen often, it can be brought upon if you skip a meal or do not eat enough. Also, if your insulin or other diabetes medications are dosed too high, this can cause your blood sugar to go to low.   Warning signs of low blood sugar include: 1. Feeling shaky or dizzy 2. Feeling weak or tired  3. Excessive hunger 4. Feeling anxious or upset  5. Sweating even when you aren't exercising  What to do if I experience low blood sugar? 1. Check your blood sugar with your meter. If lower than 70, proceed to step 2.  2. Treat with 3-4 glucose tablets or 3 packets of regular sugar. If these aren't around, you can try hard candy. Yet another option would be to drink 4 ounces of fruit juice or 6 ounces of REGULAR soda.  3. Re-check your sugar in 15 minutes. If it is still below 70, do what you did in step 2 again. If has come back up, go ahead and eat a snack or small meal at this time.

## 2020-01-25 ENCOUNTER — Ambulatory Visit: Payer: Self-pay | Admitting: Pharmacist

## 2020-01-25 NOTE — Progress Notes (Unsigned)
S:     PCP: Dr. Jillyn Hidden  Patient arrives in good spirits. Presents for diabetes and hypertension management Patient was referred and last seen by Primary Care Provider on 12/30/19 and last seen by clinical pharmacist on 01/13/20 at which Novolin R and metformin IR was discontinued; metformin XR and Trulicity were initiated; and Lantus 15 units daily was continued. Additionally, BP was elevated at 136/90 while on losartan 50 mg daily for 2 weeks.  Patient reports Diabetes was diagnosed in 2017.  Today, patient reports ***  A1C = Check Clinic BG Review medications and adherence (timing of meds, etc.)  Ate or drank anything prior to visit today? At home BGs?  Marland Kitchen Highs . Lows  Hyperglycemia sx (nocturia, neuropathy, visual changes, foot exams) Hypoglycemia symptoms (dizziness, shaky, sweating, hungry, confusion) Diet Exercise  Plan: - Trulicity - 2 weeks left - titrate lantus   Compliance? Took meds this morning? When do you take your meds? Dizziness, headaches, blurred vision? History of swelling? Check Clinic BP? Home BP logs? If no logs, bring to next visit w/ BP cuff Go over BP goals Additional BP therapy if needed . Increase losartan to 100 mg daily - BMET wnl as of 10/29 . Labs - BMET at next visit  Diet??  Exercise??   Family/Social History:  - FHx: DM (mother & father) - Tobacco: 3-4 cigarettes/day  Insurance coverage/medication affordability: uninsured  Medication adherence reported *** .   Current diabetes medications include: Trulicity 0.75 mg weekly, Lantus 15 units daily, metformin XR 500 mg - 4 tablets daily Current hypertension medications include: losartan 50 mg daily Current hyperlipidemia medications include: none  Patient {Actions; denies-reports:120008} hypoglycemic events.  Patient reported dietary habits: Eats 1 meal/day Admits to dietary indiscretions and enjoys drinking juice   Patient-reported exercise habits: None outside of work    Patient {Actions; denies-reports:120008} nocturia (nighttime urination).  Patient {Actions; denies-reports:120008} neuropathy (nerve pain). Patient {Actions; denies-reports:120008} visual changes. Patient {Actions; denies-reports:120008} self foot exams.     O:  POCT:  Home fasting blood sugars: ***  2 hour post-meal/random blood sugars: ***.  BP readings:  Lab Results  Component Value Date   HGBA1C 14.5 (A) 12/30/2019   There were no vitals filed for this visit.  Lipid Panel     Component Value Date/Time   CHOL 221 (H) 12/30/2019 1454   TRIG 155 (H) 12/30/2019 1454   HDL 61 12/30/2019 1454   CHOLHDL 3.6 12/30/2019 1454   LDLCALC 133 (H) 12/30/2019 1454    Clinical Atherosclerotic Cardiovascular Disease (ASCVD): No  The ASCVD Risk score Denman George DC Jr., et al., 2013) failed to calculate for the following reasons:   The 2013 ASCVD risk score is only valid for ages 8 to 20    A/P: Diabetes diagnosed in 2017 currently uncontrolled. Patient is *** able to verbalize appropriate hypoglycemia management plan. Medication adherence appears ***. Control is suboptimal due to ***. -Lantus 15 units daily -Continued Trulicity 0.75 mg daily -Continued Metformin XR 500 mg - 4 tablets daily with largest meal -Extensively discussed pathophysiology of diabetes, recommended lifestyle interventions, dietary effects on blood sugar control -Counseled on s/sx of and management of hypoglycemia -Next A1C anticipated ***.   ASCVD risk - Patient is less than 73 years old. Last LDL 133 on 12/30/19. Statin not indicated at this time.  Hypertension longstanding*** currently ***.  Blood pressure goal = *** mmHg. Medication adherence ***.  Blood pressure control is suboptimal due to ***. -***  Written patient instructions  provided.  Total time in face to face counseling *** minutes.   Follow up Pharmacist/PCP*** Clinic Visit in ***.    Fabio Neighbors, PharmD, BCPS PGY2 Ambulatory Care Resident [facility]  Pharmacy

## 2020-02-08 ENCOUNTER — Ambulatory Visit: Payer: Self-pay | Admitting: Pharmacist

## 2020-02-09 ENCOUNTER — Encounter: Payer: Self-pay | Admitting: Pharmacist

## 2020-02-09 ENCOUNTER — Other Ambulatory Visit: Payer: Self-pay

## 2020-02-09 ENCOUNTER — Ambulatory Visit: Payer: Self-pay | Attending: Internal Medicine | Admitting: Pharmacist

## 2020-02-09 DIAGNOSIS — E1165 Type 2 diabetes mellitus with hyperglycemia: Secondary | ICD-10-CM

## 2020-02-09 MED ORDER — TRULICITY 1.5 MG/0.5ML ~~LOC~~ SOAJ
1.5000 mg | SUBCUTANEOUS | 2 refills | Status: DC
  Filled 2020-06-18 (×2): qty 2, 28d supply, fill #0

## 2020-02-09 MED ORDER — METFORMIN HCL ER 500 MG PO TB24
1000.0000 mg | ORAL_TABLET | Freq: Every day | ORAL | 2 refills | Status: DC
  Filled 2020-06-18: qty 60, 30d supply, fill #0

## 2020-02-09 MED FILL — LOSARTAN POTASSIUM 50 MG TA: 50 | 30 days supply | Qty: 30 | Fill #1

## 2020-02-09 MED FILL — TRUE METRIX GO GLUCOSE METE: W/DEVICE | 1 days supply | Qty: 1 | Fill #0

## 2020-02-09 MED FILL — ?TRULICITY 1.5 MG/0.5 ML PE: 1.5 | 28 days supply | Qty: 2 | Fill #0

## 2020-02-09 MED FILL — metFORMIN HCL ER 500 MG TB2: 500 | 30 days supply | Qty: 60 | Fill #0

## 2020-02-09 MED FILL — ?BASAGLAR 100 UNITS/ML KWPE: 100 | 20 days supply | Qty: 3 | Fill #1

## 2020-02-09 NOTE — Progress Notes (Signed)
    S:    PCP: Dr. Laural Benes  No chief complaint on file.  Patient arrives in good spirits.  Presents for diabetes evaluation, education, and management. Patient was referred and last seen by Primary Care Provider on 12/30/2019.   Patient reports Diabetes was diagnosed in 2017.   Family/Social History:  - FHx: DM (mother & father) - Tobacco: 3-4 cigarettes/day  Insurance coverage/medication affordability: uninsured  Patient is not adherent with medications. Current diabetes medications include: -Metformin XR 2000 mg daily (takes 1000 mg daily d/t diarrhea) -Lantus 15 once daily at night (not taking - ran out last week) -Trulicity 0.75 mg daily (taking)  Patient denies hypoglycemic events.  Patient reported dietary habits: Eats 1 meal/day Admits to dietary indiscretion and enjoys drinking juice   Patient-reported exercise habits: None outside of work   Patient denies nocturia (nighttime urination).  Patient denies neuropathy (nerve pain). Patient denies visual changes.    O:  There were no vitals filed for this visit.  Lab Results  Component Value Date   HGBA1C 14.5 (A) 12/30/2019   There were no vitals filed for this visit.  Lipid Panel     Component Value Date/Time   CHOL 221 (H) 12/30/2019 1454   TRIG 155 (H) 12/30/2019 1454   HDL 61 12/30/2019 1454   CHOLHDL 3.6 12/30/2019 1454   LDLCALC 133 (H) 12/30/2019 1454    Home fasting blood sugars: does not check 2 hour post-meal/random blood sugars: does not check.  Clinical Atherosclerotic Cardiovascular Disease (ASCVD): No  The ASCVD Risk score Denman George DC Jr., et al., 2013) failed to calculate for the following reasons:   The 2013 ASCVD risk score is only valid for ages 85 to 48    A/P: Diabetes diagnosed in 2017 currently uncontrolled. Patient is able to verbalize appropriate hypoglycemia management plan. Not adherent to most medications. Control is suboptimal due to medication nonadherence. -Continue Lantus  15 units daily in the morning. Emphasized compliance.  -Start taking metformin XR 500 mg- 2 tablets daily with largest meal -Increase Trulicity to 1.5 mg once weekly -Extensively discussed pathophysiology of diabetes, recommended lifestyle interventions, dietary effects on blood sugar control -Counseled on s/sx of and management of hypoglycemia -Next A1C anticipated 03/2020  Written patient instructions provided.  Total time in face to face counseling 15 minutes.   Follow up Pharmacist Clinic Visit in 1 month.   Butch Penny, PharmD, [place], CPP Clinical Pharmacist [facility] & [facility] [phone]

## 2020-03-15 MED FILL — LOSARTAN POTASSIUM 50 MG TA: 50 | 30 days supply | Qty: 30 | Fill #1

## 2020-03-15 MED FILL — ?TRULICITY 1.5 MG/0.5 ML PE: 1.5 | 28 days supply | Qty: 2 | Fill #0

## 2020-04-02 ENCOUNTER — Ambulatory Visit: Payer: Self-pay | Admitting: Family Medicine

## 2020-04-02 ENCOUNTER — Ambulatory Visit: Payer: Self-pay | Admitting: Internal Medicine

## 2020-06-18 ENCOUNTER — Other Ambulatory Visit: Payer: Self-pay

## 2020-06-18 ENCOUNTER — Emergency Department (HOSPITAL_COMMUNITY): Payer: Self-pay

## 2020-06-18 ENCOUNTER — Observation Stay (HOSPITAL_COMMUNITY)
Admission: EM | Admit: 2020-06-18 | Discharge: 2020-06-18 | Disposition: A | Discharge status: Home / Self Care | Payer: Self-pay | Attending: Internal Medicine | Admitting: Internal Medicine

## 2020-06-18 ENCOUNTER — Encounter (HOSPITAL_COMMUNITY): Payer: Self-pay

## 2020-06-18 DIAGNOSIS — E875 Hyperkalemia: Secondary | ICD-10-CM | POA: Insufficient documentation

## 2020-06-18 DIAGNOSIS — Z794 Long term (current) use of insulin: Secondary | ICD-10-CM | POA: Insufficient documentation

## 2020-06-18 DIAGNOSIS — F17210 Nicotine dependence, cigarettes, uncomplicated: Secondary | ICD-10-CM | POA: Insufficient documentation

## 2020-06-18 DIAGNOSIS — E119 Type 2 diabetes mellitus without complications: Secondary | ICD-10-CM | POA: Insufficient documentation

## 2020-06-18 DIAGNOSIS — N189 Chronic kidney disease, unspecified: Secondary | ICD-10-CM | POA: Insufficient documentation

## 2020-06-18 DIAGNOSIS — Z79899 Other long term (current) drug therapy: Secondary | ICD-10-CM | POA: Insufficient documentation

## 2020-06-18 DIAGNOSIS — I129 Hypertensive chronic kidney disease with stage 1 through stage 4 chronic kidney disease, or unspecified chronic kidney disease: Secondary | ICD-10-CM | POA: Insufficient documentation

## 2020-06-18 DIAGNOSIS — N179 Acute kidney failure, unspecified: Secondary | ICD-10-CM

## 2020-06-18 HISTORY — DX: Acute kidney failure, unspecified: N17.9

## 2020-06-18 LAB — BASIC METABOLIC PANEL
Anion gap: 8 (ref 5–15)
BUN: 26 mg/dL — ABNORMAL HIGH (ref 6–20)
CO2: 26 mmol/L (ref 22–32)
Calcium: 9.3 mg/dL (ref 8.9–10.3)
Chloride: 97 mmol/L — ABNORMAL LOW (ref 98–111)
Creatinine, Ser: 1.63 mg/dL — ABNORMAL HIGH (ref 0.61–1.24)
GFR, Estimated: 58 mL/min — ABNORMAL LOW (ref >60)
Glucose, Bld: 492 mg/dL — ABNORMAL HIGH (ref 70–99)
Potassium: 5.7 mmol/L — ABNORMAL HIGH (ref 3.5–5.1)
Sodium: 131 mmol/L — ABNORMAL LOW (ref 135–145)

## 2020-06-18 LAB — CBC
HCT: 43.2 % (ref 39.0–52.0)
Hemoglobin: 14 g/dL (ref 13.0–17.0)
MCH: 23.8 pg — ABNORMAL LOW (ref 26.0–34.0)
MCHC: 32.4 g/dL (ref 30.0–36.0)
MCV: 73.5 fL — ABNORMAL LOW (ref 80.0–100.0)
Platelets: 305 10*3/uL (ref 150–400)
RBC: 5.88 MIL/uL — ABNORMAL HIGH (ref 4.22–5.81)
RDW: 13.5 % (ref 11.5–15.5)
WBC: 7.5 10*3/uL (ref 4.0–10.5)
nRBC: 0 % (ref 0.0–0.2)

## 2020-06-18 LAB — TROPONIN I (HIGH SENSITIVITY)
Troponin I (High Sensitivity): 8 ng/L (ref <18)
Troponin I (High Sensitivity): 8 ng/L (ref <18)

## 2020-06-18 MED ORDER — INSULIN ASPART 100 UNIT/ML ~~LOC~~ SOLN
10.0000 [IU] | Freq: Once | SUBCUTANEOUS | Status: DC
  Filled 2020-06-18: qty 0.1

## 2020-06-18 MED ORDER — SODIUM ZIRCONIUM CYCLOSILICATE 10 G PO PACK
10.0000 g | PACK | Freq: Once | ORAL | Status: AC
  Administered 2020-06-18: 10 g via ORAL
  Filled 2020-06-18: qty 1

## 2020-06-18 MED ORDER — SODIUM CHLORIDE 0.9 % IV BOLUS
1000.0000 mL | Freq: Once | INTRAVENOUS | Status: AC
  Administered 2020-06-18: 1000 mL via INTRAVENOUS

## 2020-06-18 MED ORDER — AMLODIPINE BESYLATE 5 MG PO TABS
5.0000 mg | ORAL_TABLET | Freq: Once | ORAL | Status: AC
  Administered 2020-06-18: 5 mg via ORAL
  Filled 2020-06-18: qty 1

## 2020-06-18 MED ORDER — LOSARTAN POTASSIUM 25 MG PO TABS: 50.0000 mg | ORAL_TABLET | Freq: Once | ORAL | Status: DC

## 2020-06-18 MED FILL — Losartan Potassium Tab 50 MG: ORAL | 30 days supply | Qty: 30 | Fill #0 | Status: AC

## 2020-06-18 NOTE — ED Triage Notes (Addendum)
Pt presents with c/o chest pain since yesterday and right arm pain for one month. Pt reports that he took a "honey pack?" pill that is like Viagra yesterday and that is when the pain started. 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

## 2020-06-18 NOTE — ED Notes (Signed)
Per Ronaldo Miyamoto, MD, patient does not want to stay and wants to leave AMA.  Patient also educated on the risks of leaving AMA including the fact that he could die, patient still wants to leave to go home to smoke a blunt and cry in peace.  Patient's IV removed and he signed AMA form.

## 2020-06-18 NOTE — ED Provider Notes (Signed)
I provided a substantive portion of the care of this patient.  I personally performed the entirety of the medical decision making for this encounter.  EKG Interpretation  Date/Time:  Monday June 18 2020 10:56:13 EDT Ventricular Rate:  89 PR Interval:  157 QRS Duration: 96 QT Interval:  363 QTC Calculation: 442 R Axis:   58 Text Interpretation: Sinus rhythm Borderline repolarization abnormality ST elevation, consider anterolateral injury Confirmed by Lorre Nick ([id number]) on 06/18/2020 2:71:80 PM  30 year old male who presents after having several hours of chest achiness without associated symptoms.  Patient has been noncompliant with his medications, diabetes and high blood pressure.  Does have mild hyperkalemia here which was treated will repeat.  Chronic kidney disease appears to be somewhat worsening.  Patient hyperglycemia here and that was addressed.  Will consult hospitalist for mission  Lorre Nick, MD 06/18/20 [phone]

## 2020-06-18 NOTE — ED Provider Notes (Signed)
[facility] DEPT Provider Note   CSN: [id number] Arrival date & time: 06/18/20  1009     History Chief Complaint  Patient presents with  . Chest Pain    Willie Spears is a 30 y.o. male who presents with concern for left-sided chest pain that began approximately 3 hours after he took a "honey pack" yesterday.  Patient informed this provider that this is an over-the-counter supplement utilized to improve sexual performance, "like natural Viagra".  Upon further research it appears that this supplement may contain tadalafil. Patient states that he has dull aching left-sided chest pain that feels like "when my muscle is sore but deeper".  He denies any radiation of this chest pain denies any shortness of breath, palpitations, nausea or vomiting, dizziness, lightheadedness, syncope.  He denies any history of cardiac issue.  He does have history of diabetes on metformin and Trulicity.  Additionally he denies any symptoms of priapism this time.  He states he has taken 2 of these packets in the last 24 hours.  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  Has not altered in any way since the onset of his chest pain.  I personally reviewed this patient's medical records.  He has history of type 2 diabetes, hypertension, and scrotal abscess that required debridement.  HPI  HPI: A 30 year old patient with a history of treated diabetes and hypertension presents for evaluation of chest pain. Initial onset of pain was more than 6 hours ago. The patient's chest pain is described as heaviness/pressure/tightness and is not worse with exertion. The patient's chest pain is middle- or left-sided, is not well-localized, is not sharp and does not radiate to the arms/jaw/neck. The patient does not complain of nausea and denies diaphoresis. The patient has smoked in the past 90 days and has a family history of coronary artery  disease in a first-degree relative with onset less than age 80. The patient has no history of stroke, has no history of peripheral artery disease, has no history of hypercholesterolemia and does not have an elevated BMI (>=30).   Past Medical History:  Diagnosis Date  . Diabetes mellitus without complication ([place])   . Hypertension     Patient Active Problem List   Diagnosis Date Noted  . Scrotal abscess 05/01/2018  . Type 2 diabetes mellitus ([place]) 03/29/2015  . Vitamin D deficiency 03/29/2015  . Fatigue 03/28/2015  . Family history of diabetes mellitus 03/28/2015    Past Surgical History:  Procedure Laterality Date  . IRRIGATION AND DEBRIDEMENT ABSCESS N/A 05/02/2018   Procedure: IRRIGATION AND DEBRIDEMENT ABSCESS scrotal;  Surgeon: [name], MD;  Location: [facility];  Service: Urology;  Laterality: N/A;       Family History  Problem Relation Age of Onset  . Diabetes Mother   . Diabetes Father     Social History   Tobacco Use  . Smoking status: Current Every Day Smoker    Types: Cigarettes  . Smokeless tobacco: Never Used  Vaping Use  . Vaping Use: Never used  Substance Use Topics  . Alcohol use: No  . Drug use: Yes    Types: Marijuana    Home Medications Prior to Admission medications   Medication Sig Start Date End Date Taking? Authorizing Provider  acetaminophen (TYLENOL) 500 MG tablet Take 1 tablet (500 mg total) by mouth every 6 (six) hours as needed. 03/18/19   [name], PA-C  benzonatate (TESSALON)  100 MG capsule Take 1 capsule (100 mg total) by mouth every 8 (eight) hours. Patient not taking: Reported on 12/30/2019 03/18/19   [name], PA-C  Blood Glucose Monitoring Suppl (TRUE METRIX METER) w/Device KIT Use to check blood sugar twice daily. 01/13/20   [name], [name], MD  Blood Pressure KIT 1 each by Does not apply route daily. Patient not taking: Reported on 12/30/2019 05/05/18   [name], MD  Dulaglutide (TRULICITY) 1.5 IW/5.8KD SOPN  Inject 1.5 mg into the skin once a week. 02/09/20   [name], MD  glucose blood (TRUE METRIX BLOOD GLUCOSE TEST) test strip Use as instructed to check blood sugar twice daily. 01/13/20   [name], [name], MD  Insulin Glargine (BASAGLAR KWIKPEN) 100 UNIT/ML `NJECT 15 UNITS INTO THE SKIN DAILY. 12/30/19 12/29/20  [name], [name], MD  insulin glargine (LANTUS SOLOSTAR) 100 UNIT/ML Solostar Pen Inject 15 Units into the skin daily. 12/30/19   [name], [name], MD  Insulin Pen Needle 31G X 5 MM MISC 1 each by Does not apply route daily. Patient not taking: Reported on 12/30/2019 05/05/18   [name], MD  losartan (COZAAR) 50 MG tablet TAKE 1 TABLET (50 MG TOTAL) BY MOUTH DAILY. TO LOWER BLOOD PRESSURE 12/30/19 12/29/20  [name], [name], MD  metFORMIN (GLUCOPHAGE-XR) 500 MG 24 hr tablet Take 2 tablets (1,000 mg total) by mouth daily. Take after largest meal of the day. 02/09/20   [name], MD  TRUEplus Lancets 28G MISC USE TO CHECK BLOOD SUGAR TWICE DAILY. 01/13/20 01/12/21  [name], [name], MD  amLODipine (NORVASC) 10 MG tablet Take 1 tablet (10 mg total) by mouth daily. Patient not taking: Reported on 03/18/2019 05/06/18 03/18/19  [name], MD  hydrochlorothiazide (HYDRODIURIL) 50 MG tablet Take 1 tablet (50 mg total) by mouth daily. Patient not taking: Reported on 03/18/2019 05/06/18 03/18/19  [name], MD  insulin aspart (NOVOLOG FLEXPEN) 100 UNIT/ML FlexPen Inject 5 Units into the skin 3 (three) times daily with meals for 30 days. Patient not taking: Reported on 03/18/2019 05/05/18 03/18/19  [name], MD  insulin regular (NOVOLIN R) 100 units/mL injection Inject 0.1 mLs (10 Units total) into the skin 3 (three) times daily before meals. 12/30/19 01/13/20  [name], MD    Allergies    Patient has no known allergies.  Review of Systems   Review of Systems  Constitutional: Negative.   HENT: Negative.   Respiratory: Negative.   Cardiovascular: Positive for chest pain. Negative  for palpitations and leg swelling.  Gastrointestinal: Negative.   Genitourinary: Negative.   Musculoskeletal: Negative.   Skin: Negative.   Neurological: Negative.   Hematological: Negative.     Physical Exam Updated Vital Signs BP (!) 168/122   Pulse 84   Temp 98.5 F (36.9 C) (Oral)   Resp 15   SpO2 100%   Physical Exam Vitals and nursing note reviewed.  Constitutional:      Appearance: He is obese. He is not toxic-appearing.  HENT:     Head: Normocephalic and atraumatic.     Nose: Nose normal.     Mouth/Throat:     Mouth: Mucous membranes are moist.     Pharynx: Oropharynx is clear. Uvula midline. No oropharyngeal exudate, posterior oropharyngeal erythema or uvula swelling.     Tonsils: No tonsillar exudate.  Eyes:     General: Lids are normal. Vision grossly intact.        Right eye: No discharge.        Left  eye: No discharge.     Extraocular Movements: Extraocular movements intact.     Conjunctiva/sclera: Conjunctivae normal.     Pupils: Pupils are equal, round, and reactive to light.  Neck:     Trachea: Trachea and phonation normal.  Cardiovascular:     Rate and Rhythm: Normal rate and regular rhythm.     Pulses: Normal pulses.     Heart sounds: Normal heart sounds. No murmur heard.   Pulmonary:     Effort: Pulmonary effort is normal. No tachypnea, bradypnea, accessory muscle usage, prolonged expiration or respiratory distress.     Breath sounds: Normal breath sounds. No wheezing or rales.  Chest:     Chest wall: No mass, lacerations, deformity, swelling, tenderness, crepitus or edema.  Abdominal:     General: Bowel sounds are normal. There is no distension.     Palpations: Abdomen is soft.     Tenderness: There is no abdominal tenderness.  Musculoskeletal:        General: No deformity.     Cervical back: Neck supple. No rigidity or crepitus. No pain with movement, spinous process tenderness or muscular tenderness.     Right lower leg: No edema.      Left lower leg: No edema.  Lymphadenopathy:     Cervical: No cervical adenopathy.  Skin:    General: Skin is warm and dry.  Neurological:     Mental Status: He is alert. Mental status is at baseline.     Sensory: Sensation is intact.     Motor: Motor function is intact.     Gait: Gait is intact.  Psychiatric:        Mood and Affect: Mood normal.     ED Results / Procedures / Treatments   Labs (all labs ordered are listed, but only abnormal results are displayed) Labs Reviewed  BASIC METABOLIC PANEL - Abnormal; Notable for the following components:      Result Value   Sodium 131 (*)    Potassium 5.7 (*)    Chloride 97 (*)    Glucose, Bld 492 (*)    BUN 26 (*)    Creatinine, Ser 1.63 (*)    GFR, Estimated 58 (*)    All other components within normal limits  CBC - Abnormal; Notable for the following components:   RBC 5.88 (*)    MCV 73.5 (*)    MCH 23.8 (*)    All other components within normal limits  RESP PANEL BY RT-PCR (FLU A&B, COVID) ARPGX2  TROPONIN I (HIGH SENSITIVITY)  TROPONIN I (HIGH SENSITIVITY)    EKG EKG Interpretation  Date/Time:  Monday June 18 2020 10:56:13 EDT Ventricular Rate:  89 PR Interval:  157 QRS Duration: 96 QT Interval:  363 QTC Calculation: 442 R Axis:   58 Text Interpretation: Sinus rhythm Borderline repolarization abnormality ST elevation, consider anterolateral injury Confirmed by Lacretia Leigh ([id number]) on 06/18/2020 2:03:08 PM   Radiology DG Chest 2 View  Result Date: 06/18/2020 CLINICAL DATA:  Onset chest pain yesterday. EXAM: CHEST - 2 VIEW COMPARISON:  Single-view of the chest 03/18/2019. FINDINGS: Multiple wires overlie the patient. Lungs appear clear. Heart size is normal. No pneumothorax or pleural fluid. No acute or focal bony abnormality. IMPRESSION: No acute disease. Electronically Signed   By: Inge Rise M.D.   On: 06/18/2020 11:57   Procedures Procedures   Medications Ordered in ED Medications  insulin aspart  (novoLOG) injection 10 Units (has no administration in time range)  sodium  chloride 0.9 % bolus 1,000 mL (1,000 mLs Intravenous New Bag/Given 06/18/20 1241)  sodium zirconium cyclosilicate (LOKELMA) packet 10 g (10 g Oral Given 06/18/20 1301)  amLODipine (NORVASC) tablet 5 mg (5 mg Oral Given 06/18/20 1301)    ED Course  I have reviewed the triage vital signs and the nursing notes.  Pertinent labs & imaging results that were available during my care of the patient were reviewed by me and considered in my medical decision making (see chart for details).  Clinical Course as of 06/18/20 [place] Jun 18, 2020  1445 Consult to hospitalist, Dr. Marylyn Ishihara, who is agreeable to seeing this patient and admitting to his service.  Appreciate his collaboration in the care of this patient. [RS]    Clinical Course User Index [RS] Sponseller, Gypsy Balsam, PA-C   MDM Rules/Calculators/A&P HEAR Score: 59                       30 year old male who presents with concern for 24 hours of left-sided achy chest pain since taking sexual enhancement medication x2.  Differential diagnosis for this patient symptoms includes but is not limited to ACS, PE, angina, pneumonia, musculoskeletal injury, CHF, reactive airway disorder. Patient is PERC negative.   Hypertensive on intake, vital signs otherwise normal.  Cardiopulmonary exam is normal, abdominal exam is benign.  Patient is neurovascularly intact in all 4 extremities.  Will proceed with basic cardiac work-up. EKG with ST changes, elevated HEART score, normal initial troponin. Will proceed with delta troponin. BMP with hyperkalemia of 5.7, elevated BUN/Cr, 26/1.63, baseline near 1.3.  Hyperglycemia near 500.  Will administer subcu insulin.  Will administer Lokelma as well as oral dose of amlodipine given persistent hypertension since arrival to the ED.  Upon discussion with the patient he has not taken his hypertensive or diabetes medications for 4 months due to never  calling for refills.  Extensive discussion regarding the importance of these medications for his overall wellbeing and future health; he voiced understanding and is agreeable to calling for refill of these medications today.  Delta troponin pending.  Delta troponin negative, 8.  Extensive discussion with attending physician regarding this disposition plan for this patient.  Given hyperglycemia of nearly 500, AKI with hyperkalemia, and abnormal EKG, recommend patient be admitted to the hospital overnight for observation and recheck of his electrolytes and renal function.  Consult placed to hospitalist as above.  Tyion voiced understanding of his medical evaluation and treatment plan. Each of his questions was answered to his expressed satisfaction.  He is amenable to plan for admission at this time.  This chart was dictated using voice recognition software, Dragon. Despite the best efforts of this provider to proofread and correct errors, errors may still occur which can change documentation meaning.  Final Clinical Impression(s) / ED Diagnoses Final diagnoses:  None    Rx / DC Orders ED Discharge Orders    None       Aura Dials 06/18/20 1450    Lacretia Leigh, MD 06/19/20 1130

## 2020-06-18 NOTE — Progress Notes (Signed)
30 yo male w/ history of DM2, HTN. Presenting with CP. As I started my interview, the patient informed me that he did not want to be admitted to the hospital. He wished to go home. I explained to him the reasons that we wanted to admit; namely his AKI and K+. I went into detail about kidney function as related to uncontrolled BP and DM2. I went into detail about heart function as related to K+ levels. I explained the risks of letting these problems remain poorly controlled. I explained the risks of leaving without proper treatment. He voiced understanding and stated that he still wished to go home. He was given [facility] paperwork. EDP was notified.   Teddy Spike, DO

## 2020-06-20 ENCOUNTER — Encounter: Payer: Self-pay | Admitting: Emergency Medicine

## 2020-06-20 ENCOUNTER — Other Ambulatory Visit: Payer: Self-pay

## 2020-06-20 ENCOUNTER — Ambulatory Visit
Admission: EM | Admit: 2020-06-20 | Discharge: 2020-06-20 | Disposition: A | Discharge status: Home / Self Care | Payer: Self-pay | Attending: Emergency Medicine | Admitting: Emergency Medicine

## 2020-06-20 DIAGNOSIS — M25511 Pain in right shoulder: Secondary | ICD-10-CM

## 2020-06-20 MED ORDER — DICLOFENAC SODIUM 1 % EX GEL
2.0000 g | Freq: Four times a day (QID) | CUTANEOUS | 0 refills | Status: DC
  Filled 2020-06-20: qty 100, 12d supply, fill #0

## 2020-06-20 NOTE — ED Triage Notes (Signed)
Pt here for right shoulder pain x weeks that is worse with random movements

## 2020-06-20 NOTE — ED Provider Notes (Addendum)
Willie Spears    CSN: [id number] Arrival date & time: 06/20/20  1606      History   Chief Complaint Chief Complaint  Patient presents with  . Shoulder Pain    HPI Willie Spears is a 30 y.o. male history of hypertension, DM type II, recent AKI presenting today for evaluation of shoulder pain.  Reports over the past 2 to 3 weeks he has had right shoulder pain which has come and gone.  Denies any specific injury or trauma.  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  Denies difficulty performing over the head motions.  Denies any weakness.  Denies numbness or tingling.  Will feel pain within the joint itself.  HPI  Past Medical History:  Diagnosis Date  . Diabetes mellitus without complication ([name])   . Hypertension     Patient Active Problem List   Diagnosis Date Noted  . AKI (acute kidney injury) ([name]) 06/18/2020  . Scrotal abscess 05/01/2018  . Type 2 diabetes mellitus ([name]) 03/29/2015  . Vitamin D deficiency 03/29/2015  . Fatigue 03/28/2015  . Family history of diabetes mellitus 03/28/2015    Past Surgical History:  Procedure Laterality Date  . IRRIGATION AND DEBRIDEMENT ABSCESS N/A 05/02/2018   Procedure: IRRIGATION AND DEBRIDEMENT ABSCESS scrotal;  Surgeon: [name], MD;  Location: [facility];  Service: Urology;  Laterality: N/A;       Home Medications    Prior to Admission medications   Medication Sig Start Date End Date Taking? Authorizing Provider  diclofenac Sodium (VOLTAREN) 1 % GEL Apply 2 g topically 4 (four) times daily. 06/20/20  Yes [name] C, PA-C  acetaminophen (TYLENOL) 500 MG tablet Take 1 tablet (500 mg total) by mouth every 6 (six) hours as needed. Patient not taking: No sig reported 03/18/19   [name], PA-C  benzonatate (TESSALON) 100 MG capsule Take 1 capsule (100 mg total) by mouth every 8 (eight) hours. Patient not taking: No sig reported 03/18/19   [name], PA-C  Blood Glucose Monitoring Suppl (TRUE  METRIX METER) w/Device KIT Use to check blood sugar twice daily. 01/13/20   [name], [name], MD  Blood Pressure KIT 1 each by Does not apply route daily. Patient not taking: Reported on 12/30/2019 05/05/18   [name], MD  Dulaglutide (TRULICITY) 1.5 XF/8.1WE SOPN Inject 1.5 mg into the skin once a week. 02/09/20   [name], MD  glucose blood (TRUE METRIX BLOOD GLUCOSE TEST) test strip Use as instructed to check blood sugar twice daily. 01/13/20   [name], [name], MD  Insulin Glargine (BASAGLAR KWIKPEN) 100 UNIT/ML `NJECT 15 UNITS INTO THE SKIN DAILY. Patient not taking: No sig reported 12/30/19 12/29/20  [name], [name], MD  insulin glargine (LANTUS SOLOSTAR) 100 UNIT/ML Solostar Pen Inject 15 Units into the skin daily. Patient not taking: No sig reported 12/30/19   [name], [name], MD  Insulin Pen Needle 31G X 5 MM MISC 1 each by Does not apply route daily. Patient not taking: Reported on 12/30/2019 05/05/18   [name], MD  losartan (COZAAR) 50 MG tablet TAKE 1 TABLET (50 MG TOTAL) BY MOUTH DAILY. TO LOWER BLOOD PRESSURE Patient taking differently: Take 50 mg by mouth daily. 12/30/19 12/29/20  [name], [name], MD  metFORMIN (GLUCOPHAGE-XR) 500 MG 24 hr tablet Take 2 tablets (1,000 mg total) by mouth daily. Take after largest meal of the day. Patient taking differently: Take 500 mg by mouth 2 (two) times daily. 02/09/20   [name]  B, MD  TRUEplus Lancets 28G MISC USE TO CHECK BLOOD SUGAR TWICE DAILY. 01/13/20 01/12/21  [name], [name], MD  amLODipine (NORVASC) 10 MG tablet Take 1 tablet (10 mg total) by mouth daily. Patient not taking: Reported on 03/18/2019 05/06/18 03/18/19  [name], MD  hydrochlorothiazide (HYDRODIURIL) 50 MG tablet Take 1 tablet (50 mg total) by mouth daily. Patient not taking: Reported on 03/18/2019 05/06/18 03/18/19  [name], MD  insulin aspart (NOVOLOG FLEXPEN) 100 UNIT/ML FlexPen Inject 5 Units into the skin 3 (three) times daily with meals for 30  days. Patient not taking: Reported on 03/18/2019 05/05/18 03/18/19  [name], MD  insulin regular (NOVOLIN R) 100 units/mL injection Inject 0.1 mLs (10 Units total) into the skin 3 (three) times daily before meals. 12/30/19 01/13/20  [name], MD    Family History Family History  Problem Relation Age of Onset  . Diabetes Mother   . Diabetes Father     Social History Social History   Tobacco Use  . Smoking status: Current Every Day Smoker    Types: Cigarettes  . Smokeless tobacco: Never Used  Vaping Use  . Vaping Use: Never used  Substance Use Topics  . Alcohol use: No  . Drug use: Yes    Types: Marijuana     Allergies   Patient has no known allergies.   Review of Systems Review of Systems  Constitutional: Negative for fatigue and fever.  Eyes: Negative for redness, itching and visual disturbance.  Respiratory: Negative for shortness of breath.   Cardiovascular: Negative for chest pain and leg swelling.  Gastrointestinal: Negative for nausea and vomiting.  Musculoskeletal: Positive for arthralgias. Negative for myalgias.  Skin: Negative for color change, rash and wound.  Neurological: Negative for dizziness, syncope, weakness, light-headedness and headaches.     Physical Exam Triage Vital Signs ED Triage Vitals  Enc Vitals Group     BP 06/20/20 1657 (!) 147/94     Pulse Rate 06/20/20 1657 (!) 104     Resp 06/20/20 1657 18     Temp 06/20/20 1657 98.3 F (36.8 C)     Temp src --      SpO2 06/20/20 1657 97 %     Weight --      Height --      Head Circumference --      Peak Flow --      Pain Score 06/20/20 1710 4     Pain Loc --      Pain Edu? --      Excl. in [place]? --    No data found.  Updated Vital Signs BP (!) 147/94 (BP Location: Left Arm)   Pulse (!) 104   Temp 98.3 F (36.8 C)   Resp 18   SpO2 97%   Visual Acuity Right Eye Distance:   Left Eye Distance:   Bilateral Distance:    Right Eye Near:   Left Eye Near:    Bilateral  Near:     Physical Exam Vitals and nursing note reviewed.  Constitutional:      Appearance: He is well-developed.     Comments: No acute distress  HENT:     Head: Normocephalic and atraumatic.     Nose: Nose normal.  Eyes:     Conjunctiva/sclera: Conjunctivae normal.  Cardiovascular:     Rate and Rhythm: Normal rate.  Pulmonary:     Effort: Pulmonary effort is normal. No respiratory distress.  Abdominal:     General: There  is no distension.  Musculoskeletal:        General: Normal range of motion.     Cervical back: Neck supple.     Comments: Right shoulder: No obvious swelling or deformity, full active range of motion of shoulder, nontender to palpation along the length of clavicle AC joint or scapular spine, no significant reproducible tenderness to palpation over proximal humerus area, strength at shoulders 5/5 and equal bilaterally, grip strength 5/5 and equal bilaterally  Skin:    General: Skin is warm and dry.  Neurological:     Mental Status: He is alert and oriented to person, place, and time.      UC Treatments / Results  Labs (all labs ordered are listed, but only abnormal results are displayed) Labs Reviewed - No data to display  EKG   Radiology No results found.  Procedures Procedures (including critical Spears time)  Medications Ordered in UC Medications - No data to display  Initial Impression / Assessment and Plan / UC Course  I have reviewed the triage vital signs and the nursing notes.  Pertinent labs & imaging results that were available during my Spears of the patient were reviewed by me and considered in my medical decision making (see chart for details).     Right shoulder strain- no mechanism of injury, do not suspect acute bony abnormality, full active range of motion, strength intact, low suspicion of underlying rotator cuff injury.  Suspect likely inflammatory, possible overuse due to nature of job.  Recommending rest ice anti-inflammatories  and range of motion exercises.  Recommending diclofenac cream as patient had recent AKI with elevated creatinine 1.6, will defer NSAIDs for now, also deferring any steroids given in setting of diabetes.  Continue to monitor,Discussed strict return precautions. Patient verbalized understanding and is agreeable with plan.  Final Clinical Impressions(s) / UC Diagnoses   Final diagnoses:  Acute pain of right shoulder     Discharge Instructions     Tylenol [phone] mg every 4-6 hours May use topical Voltaren/diclofenac cream to shoulder 4 times daily Alternate ice and heat Gentle range of motion exercises Follow-up if not improving or worsening    ED Prescriptions    Medication Sig Dispense Auth. Provider   diclofenac Sodium (VOLTAREN) 1 % GEL Apply 2 g topically 4 (four) times daily. 150 g Makael Stein, [place] C, PA-C     PDMP not reviewed this encounter.   Janith Lima, PA-C 06/21/20 0741    Janith Lima, PA-C 06/21/20 [phone]

## 2020-06-20 NOTE — Discharge Instructions (Signed)
Tylenol [phone] mg every 4-6 hours May use topical Voltaren/diclofenac cream to shoulder 4 times daily Alternate ice and heat Gentle range of motion exercises Follow-up if not improving or worsening

## 2020-06-21 ENCOUNTER — Other Ambulatory Visit: Payer: Self-pay

## 2020-06-28 ENCOUNTER — Other Ambulatory Visit: Payer: Self-pay

## 2020-06-28 ENCOUNTER — Ambulatory Visit
Admission: EM | Admit: 2020-06-28 | Discharge: 2020-06-28 | Disposition: A | Discharge status: Home / Self Care | Payer: Self-pay | Attending: Internal Medicine | Admitting: Internal Medicine

## 2020-06-28 DIAGNOSIS — S43402A Unspecified sprain of left shoulder joint, initial encounter: Secondary | ICD-10-CM

## 2020-06-28 MED ORDER — DICLOFENAC SODIUM 1 % EX GEL
4.0000 g | Freq: Four times a day (QID) | CUTANEOUS | 0 refills | Status: DC
  Filled 2020-06-28: qty 100, 6d supply, fill #0

## 2020-06-28 NOTE — Discharge Instructions (Signed)
Gentle range of motion exercises Apply medications as directed Icing of the left shoulder Return to urgent care if the pain worsens.

## 2020-06-28 NOTE — ED Triage Notes (Signed)
Pt c/o lt shoulder pain since yesterday. Denies injury but states lifts heavy items at work.

## 2020-06-29 ENCOUNTER — Other Ambulatory Visit: Payer: Self-pay

## 2020-06-29 NOTE — ED Provider Notes (Signed)
[facility]    CSN: [id number] Arrival date & time: 06/28/20  1654      History   Chief Complaint Chief Complaint  Patient presents with  . Shoulder Pain    HPI Willie Spears is a 30 y.o. male comes to the urgent care with left shoulder pain which started yesterday.  Patient was doing some repair work on his bike when the pain started.  He denies any trauma or falls.  Pain is currently 4 out of 10.  Pain is aggravated by movement of the left shoulder and palpation of the posterior aspect of the left shoulder.  No known relieving factors.  Patient has not tried any over-the-counter medication.  He was recently prescribed Voltaren after her diagnosis of shoulder sprain.  Patient had a great response to the Voltaren.  No shortness of breath.  No radiation of pain.  No associated numbness or tingling in the left hand.Marland Kitchen   HPI  Past Medical History:  Diagnosis Date  . Diabetes mellitus without complication ([place])   . Hypertension     Patient Active Problem List   Diagnosis Date Noted  . AKI (acute kidney injury) ([place]) 06/18/2020  . Scrotal abscess 05/01/2018  . Type 2 diabetes mellitus ([place]) 03/29/2015  . Vitamin D deficiency 03/29/2015  . Fatigue 03/28/2015  . Family history of diabetes mellitus 03/28/2015    Past Surgical History:  Procedure Laterality Date  . IRRIGATION AND DEBRIDEMENT ABSCESS N/A 05/02/2018   Procedure: IRRIGATION AND DEBRIDEMENT ABSCESS scrotal;  Surgeon: [name], MD;  Location: [facility];  Service: Urology;  Laterality: N/A;       Home Medications    Prior to Admission medications   Medication Sig Start Date End Date Taking? Authorizing Provider  Blood Glucose Monitoring Suppl (TRUE METRIX METER) w/Device KIT Use to check blood sugar twice daily. 01/13/20   [name], [name], MD  diclofenac Sodium (VOLTAREN) 1 % GEL Apply 4 g topically 4 (four) times daily. 06/28/20   Hannan Tetzlaff, Myrene Galas, MD  Dulaglutide (TRULICITY) 1.5 NW/2.9FA  SOPN Inject 1.5 mg into the skin once a week. 02/09/20   [name], MD  glucose blood (TRUE METRIX BLOOD GLUCOSE TEST) test strip Use as instructed to check blood sugar twice daily. 01/13/20   [name], [name], MD  losartan (COZAAR) 50 MG tablet TAKE 1 TABLET (50 MG TOTAL) BY MOUTH DAILY. TO LOWER BLOOD PRESSURE Patient taking differently: Take 50 mg by mouth daily. 12/30/19 12/29/20  [name], [name], MD  metFORMIN (GLUCOPHAGE-XR) 500 MG 24 hr tablet Take 2 tablets (1,000 mg total) by mouth daily. Take after largest meal of the day. Patient taking differently: Take 500 mg by mouth 2 (two) times daily. 02/09/20   [name], MD  TRUEplus Lancets 28G MISC USE TO CHECK BLOOD SUGAR TWICE DAILY. 01/13/20 01/12/21  [name], [name], MD  amLODipine (NORVASC) 10 MG tablet Take 1 tablet (10 mg total) by mouth daily. Patient not taking: Reported on 03/18/2019 05/06/18 03/18/19  [name], MD  hydrochlorothiazide (HYDRODIURIL) 50 MG tablet Take 1 tablet (50 mg total) by mouth daily. Patient not taking: Reported on 03/18/2019 05/06/18 03/18/19  [name], MD  insulin aspart (NOVOLOG FLEXPEN) 100 UNIT/ML FlexPen Inject 5 Units into the skin 3 (three) times daily with meals for 30 days. Patient not taking: Reported on 03/18/2019 05/05/18 03/18/19  [name], MD  insulin regular (NOVOLIN R) 100 units/mL injection Inject 0.1 mLs (10 Units total) into the skin 3 (three) times  daily before meals. 12/30/19 01/13/20  [name], MD    Family History Family History  Problem Relation Age of Onset  . Diabetes Mother   . Diabetes Father     Social History Social History   Tobacco Use  . Smoking status: Current Every Day Smoker    Types: Cigarettes  . Smokeless tobacco: Never Used  Vaping Use  . Vaping Use: Never used  Substance Use Topics  . Alcohol use: No  . Drug use: Yes    Types: Marijuana     Allergies   Patient has no known allergies.   Review of Systems Review of Systems   Genitourinary: Negative.   Musculoskeletal: Negative.   Neurological: Negative.   Psychiatric/Behavioral: Negative.      Physical Exam Triage Vital Signs ED Triage Vitals  Enc Vitals Group     BP 06/28/20 1827 (!) 147/83     Pulse Rate 06/28/20 1827 (!) 111     Resp 06/28/20 1827 20     Temp 06/28/20 1827 98.1 F (36.7 C)     Temp Source 06/28/20 1827 Oral     SpO2 06/28/20 1827 97 %     Weight --      Height --      Head Circumference --      Peak Flow --      Pain Score 06/28/20 1832 4     Pain Loc --      Pain Edu? --      Excl. in [place]? --    No data found.  Updated Vital Signs BP (!) 147/83 (BP Location: Left Arm)   Pulse (!) 111   Temp 98.1 F (36.7 C) (Oral)   Resp 20   SpO2 97%   Visual Acuity Right Eye Distance:   Left Eye Distance:   Bilateral Distance:    Right Eye Near:   Left Eye Near:    Bilateral Near:     Physical Exam Vitals and nursing note reviewed.  Constitutional:      General: He is not in acute distress.    Appearance: He is not ill-appearing.  Cardiovascular:     Rate and Rhythm: Normal rate and regular rhythm.     Heart sounds: Normal heart sounds.  Musculoskeletal:     Comments: Tenderness to palpation over the posterior aspect of the left shoulder rotator cuff.  Full range of motion.  No bruising over the left shoulder.  Neurological:     Mental Status: He is alert.      UC Treatments / Results  Labs (all labs ordered are listed, but only abnormal results are displayed) Labs Reviewed - No data to display  EKG   Radiology No results found.  Procedures Procedures (including critical care time)  Medications Ordered in UC Medications - No data to display  Initial Impression / Assessment and Plan / UC Course  I have reviewed the triage vital signs and the nursing notes.  Pertinent labs & imaging results that were available during my care of the patient were reviewed by me and considered in my medical decision  making (see chart for details).     1.  Left shoulder sprain: Gentle range of motion exercises Heating pad use will help with the pain Voltaren gel up to 4 times a day as needed for pain If symptoms worsen please return to the urgent care to be reevaluated. Final Clinical Impressions(s) / UC Diagnoses   Final diagnoses:  Sprain of left shoulder,  unspecified shoulder sprain type, initial encounter     Discharge Instructions     Gentle range of motion exercises Apply medications as directed Icing of the left shoulder Return to urgent care if the pain worsens.   ED Prescriptions    Medication Sig Dispense Auth. Provider   diclofenac Sodium (VOLTAREN) 1 % GEL Apply 4 g topically 4 (four) times daily. 150 g Ivyrose Hashman, Myrene Galas, MD     PDMP not reviewed this encounter.   Chase Picket, MD 06/29/20 1459

## 2020-07-06 ENCOUNTER — Other Ambulatory Visit: Payer: Self-pay

## 2020-07-09 ENCOUNTER — Other Ambulatory Visit: Payer: Self-pay

## 2020-07-09 ENCOUNTER — Ambulatory Visit: Payer: Self-pay | Admitting: Physician Assistant

## 2020-07-09 VITALS — BP 151/99 | HR 98 | Temp 98.5°F | Resp 18 | Ht 68.0 in | Wt 266.0 lb

## 2020-07-09 DIAGNOSIS — M545 Low back pain, unspecified: Secondary | ICD-10-CM

## 2020-07-09 DIAGNOSIS — I10 Essential (primary) hypertension: Secondary | ICD-10-CM

## 2020-07-09 DIAGNOSIS — E1165 Type 2 diabetes mellitus with hyperglycemia: Secondary | ICD-10-CM

## 2020-07-09 DIAGNOSIS — E559 Vitamin D deficiency, unspecified: Secondary | ICD-10-CM

## 2020-07-09 DIAGNOSIS — R809 Proteinuria, unspecified: Secondary | ICD-10-CM

## 2020-07-09 DIAGNOSIS — Z1159 Encounter for screening for other viral diseases: Secondary | ICD-10-CM

## 2020-07-09 DIAGNOSIS — N179 Acute kidney failure, unspecified: Secondary | ICD-10-CM

## 2020-07-09 LAB — GLUCOSE, POCT (MANUAL RESULT ENTRY): POC Glucose: 169 mg/dl — AB (ref 70–99)

## 2020-07-09 LAB — POCT GLYCOSYLATED HEMOGLOBIN (HGB A1C): Hemoglobin A1C: 11 % — AB (ref 4.0–5.6)

## 2020-07-09 MED ORDER — TRUE METRIX BLOOD GLUCOSE TEST VI STRP
ORAL_STRIP | 2 refills | Status: DC
  Filled 2020-07-09: qty 100, 50d supply, fill #0

## 2020-07-09 MED ORDER — METFORMIN HCL ER 500 MG PO TB24
1000.0000 mg | ORAL_TABLET | Freq: Two times a day (BID) | ORAL | 2 refills | Status: DC
Start: 2020-07-09 — End: 2020-10-01
  Filled 2020-07-09: qty 60, 15d supply, fill #0
  Filled 2020-08-21: qty 60, 15d supply, fill #1
  Filled 2020-09-24: qty 60, 15d supply, fill #2

## 2020-07-09 MED ORDER — TRULICITY 1.5 MG/0.5ML ~~LOC~~ SOAJ
1.5000 mg | SUBCUTANEOUS | 2 refills | Status: DC
  Filled 2020-07-09: qty 4, 56d supply, fill #0
  Filled 2020-07-09: qty 2, 28d supply, fill #0
  Filled 2020-08-21: qty 2, 28d supply, fill #1
  Filled 2020-09-24: qty 2, 28d supply, fill #2

## 2020-07-09 MED ORDER — TRUEPLUS LANCETS 28G MISC
2 refills | Status: DC
  Filled 2020-07-09: qty 100, 50d supply, fill #0

## 2020-07-09 NOTE — Progress Notes (Addendum)
Established Patient Office Visit  Subjective:  Patient ID: Willie Spears, male    DOB: 05-18-1990  Age: 30 y.o. MRN: [id number]  CC:  Chief Complaint  Patient presents with  . Back Pain    Left side    HPI Willie Spears discomfort in his left lower back when he woke up earlier this morning, is unsure if he "slept on it wrong", but is concerned due to learning that his kidney function was worsening after an emergency department visit June 18, 2020.    Reports that since that emergency department visit, he has been very aggressive with his lifestyle modifications, states that he is drinking 7-8 bottles of water a day, has eliminated most carbohydrates from his diet.  States he has not been able to check his blood glucose levels at home due to running out of test strips.  States that he does not check his blood pressure at home.    Past Medical History:  Diagnosis Date  . Diabetes mellitus without complication ([place])   . Hypertension     Past Surgical History:  Procedure Laterality Date  . IRRIGATION AND DEBRIDEMENT ABSCESS N/A 05/02/2018   Procedure: IRRIGATION AND DEBRIDEMENT ABSCESS scrotal;  Surgeon: [name], MD;  Location: [facility];  Service: Urology;  Laterality: N/A;    Family History  Problem Relation Age of Onset  . Diabetes Mother   . Diabetes Father     Social History   Socioeconomic History  . Marital status: Single    Spouse name: Not on file  . Number of children: Not on file  . Years of education: Not on file  . Highest education level: Not on file  Occupational History  . Not on file  Tobacco Use  . Smoking status: Current Every Day Smoker    Types: Cigarettes  . Smokeless tobacco: Never Used  Vaping Use  . Vaping Use: Never used  Substance and Sexual Activity  . Alcohol use: No  . Drug use: Yes    Types: Marijuana  . Sexual activity: Yes  Other Topics Concern  . Not on file  Social History Narrative  . Not on file   Social  Determinants of Health   Financial Resource Strain: Not on file  Food Insecurity: Not on file  Transportation Needs: Not on file  Physical Activity: Not on file  Stress: Not on file  Social Connections: Not on file  Intimate Partner Violence: Not on file    Outpatient Medications Prior to Visit  Medication Sig Dispense Refill  . Blood Glucose Monitoring Suppl (TRUE METRIX METER) w/Device KIT Use to check blood sugar twice daily. 1 kit 0  . losartan (COZAAR) 50 MG tablet TAKE 1 TABLET (50 MG TOTAL) BY MOUTH DAILY. TO LOWER BLOOD PRESSURE (Patient taking differently: Take 50 mg by mouth daily.) 30 tablet 3  . Dulaglutide (TRULICITY) 1.5 GD/9.2EQ SOPN Inject 1.5 mg into the skin once a week. 4 mL 2  . glucose blood (TRUE METRIX BLOOD GLUCOSE TEST) test strip Use as instructed to check blood sugar twice daily. 100 each 2  . metFORMIN (GLUCOPHAGE-XR) 500 MG 24 hr tablet Take 2 tablets (1,000 mg total) by mouth daily. Take after largest meal of the day. (Patient taking differently: Take 500 mg by mouth 2 (two) times daily.) 60 tablet 2  . TRUEplus Lancets 28G MISC USE TO CHECK BLOOD SUGAR TWICE DAILY. 100 each 2  . diclofenac Sodium (VOLTAREN) 1 % GEL Apply 4 g  topically 4 (four) times daily. (Patient not taking: Reported on 07/09/2020) 150 g 0   No facility-administered medications prior to visit.    No Known Allergies  ROS Review of Systems  Constitutional: Negative for chills and fever.  HENT: Negative.   Eyes: Negative.   Respiratory: Negative for shortness of breath.   Cardiovascular: Negative for chest pain.  Gastrointestinal: Negative.   Endocrine: Negative.   Genitourinary: Negative.   Musculoskeletal: Positive for back pain.  Skin: Negative.   Allergic/Immunologic: Negative.   Neurological: Negative for headaches.  Hematological: Negative.   Psychiatric/Behavioral: Negative.       Objective:    Physical Exam Vitals and nursing note reviewed.  Constitutional:       Appearance: Normal appearance.  HENT:     Head: Normocephalic and atraumatic.     Right Ear: External ear normal.     Left Ear: External ear normal.     Nose: Nose normal.     Mouth/Throat:     Mouth: Mucous membranes are moist.     Pharynx: Oropharynx is clear.  Eyes:     Extraocular Movements: Extraocular movements intact.     Conjunctiva/sclera: Conjunctivae normal.     Pupils: Pupils are equal, round, and reactive to light.  Cardiovascular:     Rate and Rhythm: Normal rate and regular rhythm.     Pulses: Normal pulses.     Heart sounds: Normal heart sounds.  Pulmonary:     Effort: Pulmonary effort is normal.     Breath sounds: Normal breath sounds.  Abdominal:     Tenderness: There is no right CVA tenderness or left CVA tenderness.  Musculoskeletal:        General: No swelling or tenderness. Normal range of motion.     Cervical back: Normal range of motion and neck supple.  Skin:    General: Skin is warm and dry.  Neurological:     General: No focal deficit present.     Mental Status: He is alert and oriented to person, place, and time.  Psychiatric:        Mood and Affect: Mood normal.        Behavior: Behavior normal.        Thought Content: Thought content normal.        Judgment: Judgment normal.     BP (!) 151/99 (BP Location: Left Arm, Patient Position: Sitting, Cuff Size: Large)   Pulse 98   Temp 98.5 F (36.9 C) (Oral)   Resp 18   Ht 5' 8"  (1.727 m)   Wt 266 lb (120.7 kg)   SpO2 98%   BMI 40.45 kg/m  Wt Readings from Last 3 Encounters:  07/09/20 266 lb (120.7 kg)  12/30/19 260 lb (117.9 kg)  05/01/18 250 lb (113.4 kg)     Health Maintenance Due  Topic Date Due  . PNEUMOCOCCAL POLYSACCHARIDE VACCINE AGE 38-64 HIGH RISK  Never done  . COVID-19 Vaccine (1) Never done  . FOOT EXAM  Never done  . OPHTHALMOLOGY EXAM  Never done  . TETANUS/TDAP  Never done    There are no preventive care reminders to display for this patient.  Lab Results   Component Value Date   TSH 0.942 03/28/2015   Lab Results  Component Value Date   WBC 7.6 07/09/2020   HGB 13.7 07/09/2020   HCT 43.0 07/09/2020   MCV 75 (L) 07/09/2020   PLT 336 07/09/2020   Lab Results  Component Value Date  NA 140 07/09/2020   K 4.3 07/09/2020   CO2 26 06/18/2020   GLUCOSE 158 (H) 07/09/2020   BUN 14 07/09/2020   CREATININE 1.50 (H) 07/09/2020   BILITOT 0.3 07/09/2020   ALKPHOS 68 07/09/2020   AST 11 07/09/2020   ALT 14 12/30/2019   PROT 6.4 07/09/2020   ALBUMIN 3.8 (L) 07/09/2020   CALCIUM 9.3 07/09/2020   ANIONGAP 8 06/18/2020   EGFR 64 07/09/2020   Lab Results  Component Value Date   CHOL 221 (H) 12/30/2019   Lab Results  Component Value Date   HDL 61 12/30/2019   Lab Results  Component Value Date   LDLCALC 133 (H) 12/30/2019   Lab Results  Component Value Date   TRIG 155 (H) 12/30/2019   Lab Results  Component Value Date   CHOLHDL 3.6 12/30/2019   Lab Results  Component Value Date   HGBA1C 11.0 (A) 07/09/2020      Assessment & Plan:   Problem List Items Addressed This Visit      Endocrine   Type 2 diabetes mellitus ([place]) - Primary (Chronic)   Relevant Medications   glucose blood (TRUE METRIX BLOOD GLUCOSE TEST) test strip   TRUEplus Lancets 28G MISC   Dulaglutide (TRULICITY) 1.5 CV/8.9FY SOPN   metFORMIN (GLUCOPHAGE-XR) 500 MG 24 hr tablet   Other Relevant Orders   HgB A1c (Completed)   Glucose (CBG) (Completed)   CBC with Differential/Platelet (Completed)   Microalbumin/Creatinine Ratio, Urine     Genitourinary   AKI (acute kidney injury) ([place])   Relevant Orders   Comp. Metabolic Panel (12) (Completed)   Microalbumin/Creatinine Ratio, Urine     Other   Vitamin D deficiency (Chronic)   Relevant Orders   Vitamin D, 25-hydroxy (Completed)    Other Visit Diagnoses    Encounter for HCV screening test for low risk patient       Relevant Orders   HCV Ab w Reflex to Quant PCR (Completed)   Elevated blood  pressure reading in office without diagnosis of hypertension        1. Type 2 diabetes mellitus with hyperglycemia, without long-term current use of insulin (H[facility]) A1c improved to 14.5 six months ago, 11.0 today.  Increase metformin 1000 mg twice daily, continue Trulicity.  Patient education given regarding diabetic diet.  Congratulated patient on motivation for lifestyle modifications  Patient has appointment with care provider August 21, 2020.  - HgB A1c - Glucose (CBG) - CBC with Differential/Platelet - glucose blood (TRUE METRIX BLOOD GLUCOSE TEST) test strip; Use as instructed to check blood sugar twice daily.  Dispense: 100 each; Refill: 2 - TRUEplus Lancets 28G MISC; USE TO CHECK BLOOD SUGAR TWICE DAILY.  Dispense: 100 each; Refill: 2 - Dulaglutide (TRULICITY) 1.5 BO/1.7PZ SOPN; Inject 1.5 mg into the skin once a week.  Dispense: 4 mL; Refill: 2 - metFORMIN (GLUCOPHAGE-XR) 500 MG 24 hr tablet; Take 2 tablets (1,000 mg total) by mouth 2 (two) times daily with a meal.  Dispense: 60 tablet; Refill: 2 - Microalbumin/Creatinine Ratio, Urine  2. Low Back Pain Reassurance given, continue hydration, proper stretching  3. AKI (acute kidney injury) ([place]) Continue proper hydration - Comp. Metabolic Panel (12) - Microalbumin/Creatinine Ratio, Urine  4. Vitamin D deficiency  - Vitamin D, 25-hydroxy  5. Encounter for HCV screening test for low risk patient  - HCV Ab w Reflex to Quant PCR    I have reviewed the patient's medical history (PMH, PSH, Social History, Family History, Medications,  and allergies) , and have been updated if relevant. I spent 35 minutes reviewing chart and  face to face time with patient.     Meds ordered this encounter  Medications  . glucose blood (TRUE METRIX BLOOD GLUCOSE TEST) test strip    Sig: Use as instructed to check blood sugar twice daily.    Dispense:  100 each    Refill:  2    Order Specific Question:   Supervising Provider    Answer:    Joya Gaskins, PATRICK E [[id number]]  . TRUEplus Lancets 28G MISC    Sig: USE TO CHECK BLOOD SUGAR TWICE DAILY.    Dispense:  100 each    Refill:  2    Order Specific Question:   Supervising Provider    Answer:   Joya Gaskins, PATRICK E [[id number]]  . Dulaglutide (TRULICITY) 1.5 NA/3.5TD SOPN    Sig: Inject 1.5 mg into the skin once a week.    Dispense:  4 mL    Refill:  2    Order Specific Question:   Supervising Provider    Answer:   Asencion Noble E [[id number]]  . metFORMIN (GLUCOPHAGE-XR) 500 MG 24 hr tablet    Sig: Take 2 tablets (1,000 mg total) by mouth 2 (two) times daily with a meal.    Dispense:  60 tablet    Refill:  2    Dose Change    Order Specific Question:   Supervising Provider    Answer:   Elsie Stain [[id number]]    Follow-up: Return if symptoms worsen or fail to improve.    Loraine Grip Mayers, PA-C

## 2020-07-09 NOTE — Patient Instructions (Addendum)
For your back pain, I encourage you to continue your great hydration regimen, do some gentle stretching, if this does not improve, you can also use some heat or ice.  For your diabetes, I encourage you to increase your metformin to 1000 mg twice daily, check your blood glucose levels on a daily basis, keep a written log and have available for all office visits.  Your A1c improved from 14.5 to 11.0.  Keep up the great work!  I encourage you to use my fitness pal, log all your food for the next couple of weeks to help improve your diet choices.  I encourage you to have protein and vegetables only for your breakfast, half a cup serving of carbohydrates at lunch and dinner and continue working on improving your daily exercise regimen.  Please let us know if there is anything else we can do for you.  We will call you with your lab results  Roney Jaffe, PA-C Physician Assistant [facility] [email or web address]    Acute Back Pain, Adult Acute back pain is sudden and usually short-lived. It is often caused by an injury to the muscles and tissues in the back. The injury may result from:  A muscle or ligament getting overstretched or torn (strained). Ligaments are tissues that connect bones to each other. Lifting something improperly can cause a back strain.  Wear and tear (degeneration) of the spinal disks. Spinal disks are circular tissue that provide cushioning between the bones of the spine (vertebrae).  Twisting motions, such as while playing sports or doing yard work.  A hit to the back.  Arthritis. You may have a physical exam, lab tests, and imaging tests to find the cause of your pain. Acute back pain usually goes away with rest and home care. Follow these instructions at home: Managing pain, stiffness, and swelling  Treatment may include medicines for pain and inflammation that are taken by mouth or applied to the skin,  prescription pain medicine, or muscle relaxants. Take over-the-counter and prescription medicines only as told by your health care provider.  Your health care provider may recommend applying ice during the first 24-48 hours after your pain starts. To do this: ? Put ice in a plastic bag. ? Place a towel between your skin and the bag. ? Leave the ice on for 20 minutes, 2-3 times a day.  If directed, apply heat to the affected area as often as told by your health care provider. Use the heat source that your health care provider recommends, such as a moist heat pack or a heating pad. ? Place a towel between your skin and the heat source. ? Leave the heat on for 20-30 minutes. ? Remove the heat if your skin turns bright red. This is especially important if you are unable to feel pain, heat, or cold. You have a greater risk of getting burned. Activity  Do not stay in bed. Staying in bed for more than 1-2 days can delay your recovery.  Sit up and stand up straight. Avoid leaning forward when you sit or hunching over when you stand. ? If you work at a desk, sit close to it so you do not need to lean over. Keep your chin tucked in. Keep your neck drawn back, and keep your elbows bent at a 90-degree angle (right angle). ? Sit high and close to the steering wheel when you drive. Add lower back (lumbar) support to your car seat, if needed.  Take short  walks on even surfaces as soon as you are able. Try to increase the length of time you walk each day.  Do not sit, drive, or stand in one place for more than 30 minutes at a time. Sitting or standing for long periods of time can put stress on your back.  Do not drive or use heavy machinery while taking prescription pain medicine.  Use proper lifting techniques. When you bend and lift, use positions that put less stress on your back: ? [place] your knees. ? Keep the load close to your body. ? Avoid twisting.  Exercise regularly as told by your health care  provider. Exercising helps your back heal faster and helps prevent back injuries by keeping muscles strong and flexible.  Work with a physical therapist to make a safe exercise program, as recommended by your health care provider. Do any exercises as told by your physical therapist.   Lifestyle  Maintain a healthy weight. Extra weight puts stress on your back and makes it difficult to have good posture.  Avoid activities or situations that make you feel anxious or stressed. Stress and anxiety increase muscle tension and can make back pain worse. Learn ways to manage anxiety and stress, such as through exercise. General instructions  Sleep on a firm mattress in a comfortable position. Try lying on your side with your knees slightly bent. If you lie on your back, put a pillow under your knees.  Follow your treatment plan as told by your health care provider. This may include: ? Cognitive or behavioral therapy. ? Acupuncture or massage therapy. ? Meditation or yoga. Contact a health care provider if:  You have pain that is not relieved with rest or medicine.  You have increasing pain going down into your legs or buttocks.  Your pain does not improve after 2 weeks.  You have pain at night.  You lose weight without trying.  You have a fever or chills. Get help right away if:  You develop new bowel or bladder control problems.  You have unusual weakness or numbness in your arms or legs.  You develop nausea or vomiting.  You develop abdominal pain.  You feel faint. Summary  Acute back pain is sudden and usually short-lived.  Use proper lifting techniques. When you bend and lift, use positions that put less stress on your back.  Take over-the-counter and prescription medicines and apply heat or ice as directed by your health care provider. This information is not intended to replace advice given to you by your health care provider. Make sure you discuss any questions you have  with your health care provider. Document Revised: 11/11/2019 Document Reviewed: 11/11/2019 Elsevier Patient Education  2021 [employer].

## 2020-07-09 NOTE — Progress Notes (Signed)
Patient reports a dull pain in the left side of his back when he woke up this morning. Patient reports pain with turning. Patient has taken medication today and patient has eaten today.

## 2020-07-10 ENCOUNTER — Telehealth: Payer: Self-pay | Admitting: Physician Assistant

## 2020-07-10 ENCOUNTER — Other Ambulatory Visit: Payer: Self-pay

## 2020-07-10 DIAGNOSIS — E1165 Type 2 diabetes mellitus with hyperglycemia: Secondary | ICD-10-CM

## 2020-07-10 DIAGNOSIS — R809 Proteinuria, unspecified: Secondary | ICD-10-CM

## 2020-07-10 DIAGNOSIS — R030 Elevated blood-pressure reading, without diagnosis of hypertension: Secondary | ICD-10-CM | POA: Insufficient documentation

## 2020-07-10 DIAGNOSIS — M545 Low back pain, unspecified: Secondary | ICD-10-CM | POA: Insufficient documentation

## 2020-07-10 LAB — COMP. METABOLIC PANEL (12)
AST: 11 IU/L (ref 0–40)
Albumin/Globulin Ratio: 1.5 (ref 1.2–2.2)
Albumin: 3.8 g/dL — ABNORMAL LOW (ref 4.1–5.2)
Alkaline Phosphatase: 68 IU/L (ref 44–121)
BUN/Creatinine Ratio: 9 (ref 9–20)
BUN: 14 mg/dL (ref 6–20)
Bilirubin Total: 0.3 mg/dL (ref 0.0–1.2)
Calcium: 9.3 mg/dL (ref 8.7–10.2)
Chloride: 104 mmol/L (ref 96–106)
Creatinine, Ser: 1.5 mg/dL — ABNORMAL HIGH (ref 0.76–1.27)
Globulin, Total: 2.6 g/dL (ref 1.5–4.5)
Glucose: 158 mg/dL — ABNORMAL HIGH (ref 65–99)
Potassium: 4.3 mmol/L (ref 3.5–5.2)
Sodium: 140 mmol/L (ref 134–144)
Total Protein: 6.4 g/dL (ref 6.0–8.5)
eGFR: 64 mL/min/{1.73_m2} (ref >59)

## 2020-07-10 LAB — MICROALBUMIN / CREATININE URINE RATIO
Creatinine, Urine: 95.9 mg/dL
Microalb/Creat Ratio: 1365 mg/g creat — ABNORMAL HIGH (ref 0–29)
Microalbumin, Urine: 1309.1 ug/mL

## 2020-07-10 LAB — CBC WITH DIFFERENTIAL/PLATELET
Basophils Absolute: 0 10*3/uL (ref 0.0–0.2)
Basos: 1 %
EOS (ABSOLUTE): 0.2 10*3/uL (ref 0.0–0.4)
Eos: 2 %
Hematocrit: 43 % (ref 37.5–51.0)
Hemoglobin: 13.7 g/dL (ref 13.0–17.7)
Immature Grans (Abs): 0 10*3/uL (ref 0.0–0.1)
Immature Granulocytes: 0 %
Lymphocytes Absolute: 2.4 10*3/uL (ref 0.7–3.1)
Lymphs: 31 %
MCH: 24 pg — ABNORMAL LOW (ref 26.6–33.0)
MCHC: 31.9 g/dL (ref 31.5–35.7)
MCV: 75 fL — ABNORMAL LOW (ref 79–97)
Monocytes Absolute: 0.4 10*3/uL (ref 0.1–0.9)
Monocytes: 5 %
Neutrophils Absolute: 4.6 10*3/uL (ref 1.4–7.0)
Neutrophils: 61 %
Platelets: 336 10*3/uL (ref 150–450)
RBC: 5.71 x10E6/uL (ref 4.14–5.80)
RDW: 14 % (ref 11.6–15.4)
WBC: 7.6 10*3/uL (ref 3.4–10.8)

## 2020-07-10 LAB — HCV INTERPRETATION

## 2020-07-10 LAB — HCV AB W REFLEX TO QUANT PCR: HCV Ab: <0.1 s/co ratio (ref 0.0–0.9)

## 2020-07-10 LAB — VITAMIN D 25 HYDROXY (VIT D DEFICIENCY, FRACTURES): Vit D, 25-Hydroxy: 12.7 ng/mL — ABNORMAL LOW (ref 30.0–100.0)

## 2020-07-10 MED ORDER — LISINOPRIL 10 MG PO TABS
10.0000 mg | ORAL_TABLET | Freq: Every day | ORAL | 1 refills | Status: DC
  Filled 2020-07-10: qty 30, 30d supply, fill #0
  Filled 2020-08-21: qty 30, 30d supply, fill #1

## 2020-07-10 MED ORDER — VITAMIN D (ERGOCALCIFEROL) 1.25 MG (50000 UNIT) PO CAPS
50000.0000 [IU] | ORAL_CAPSULE | ORAL | 2 refills | Status: DC
  Filled 2020-07-10: qty 4, 28d supply, fill #0

## 2020-07-10 NOTE — Progress Notes (Signed)
Established Patient Office Visit  Subjective:  Patient ID: Willie Spears, male    DOB: 1991/01/06  Age: 30 y.o. MRN: [id number]  CC:  Chief Complaint  Patient presents with  . Results   Virtual Visit via Telephone Note  I connected with [name] on 07/11/20 at  4:00 PM EDT by telephone and verified that I am speaking with the correct person using two identifiers.  Location: Patient: At work Provider: [facility]    I discussed the limitations, risks, security and privacy concerns of performing an evaluation and management service by telephone and the availability of in person appointments. I also discussed with the patient that there may be a patient responsible charge related to this service. The patient expressed understanding and agreed to proceed.   History of Present Illness: Mr. Ace states that he has been on losartan for the past 4 weeks, states that he had recently restarted this.  States that he has not been checking his BP at home.     Observations/Objective: Medical history and current medications reviewed, no physical exam completed    Past Medical History:  Diagnosis Date  . Diabetes mellitus without complication ([employer])   . Hypertension     Past Surgical History:  Procedure Laterality Date  . IRRIGATION AND DEBRIDEMENT ABSCESS N/A 05/02/2018   Procedure: IRRIGATION AND DEBRIDEMENT ABSCESS scrotal;  Surgeon: [name], MD;  Location: [facility];  Service: Urology;  Laterality: N/A;    Family History  Problem Relation Age of Onset  . Diabetes Mother   . Diabetes Father     Social History   Socioeconomic History  . Marital status: Single    Spouse name: Not on file  . Number of children: Not on file  . Years of education: Not on file  . Highest education level: Not on file  Occupational History  . Not on file  Tobacco Use  . Smoking status: Current Every Day Smoker    Types: Cigarettes  . Smokeless tobacco: Never  Used  Vaping Use  . Vaping Use: Never used  Substance and Sexual Activity  . Alcohol use: No  . Drug use: Yes    Types: Marijuana  . Sexual activity: Yes  Other Topics Concern  . Not on file  Social History Narrative  . Not on file   Social Determinants of Health   Financial Resource Strain: Not on file  Food Insecurity: Not on file  Transportation Needs: Not on file  Physical Activity: Not on file  Stress: Not on file  Social Connections: Not on file  Intimate Partner Violence: Not on file    Outpatient Medications Prior to Visit  Medication Sig Dispense Refill  . Blood Glucose Monitoring Suppl (TRUE METRIX METER) w/Device KIT Use to check blood sugar twice daily. 1 kit 0  . diclofenac Sodium (VOLTAREN) 1 % GEL Apply 4 g topically 4 (four) times daily. (Patient not taking: Reported on 07/09/2020) 150 g 0  . Dulaglutide (TRULICITY) 1.5 FA/2.1HY SOPN Inject 1.5 mg into the skin once a week. 4 mL 2  . glucose blood (TRUE METRIX BLOOD GLUCOSE TEST) test strip Use as instructed to check blood sugar twice daily. 100 each 2  . metFORMIN (GLUCOPHAGE-XR) 500 MG 24 hr tablet Take 2 tablets (1,000 mg total) by mouth 2 (two) times daily with a meal. 60 tablet 2  . TRUEplus Lancets 28G MISC USE TO CHECK BLOOD SUGAR TWICE DAILY. 100 each 2  .  Vitamin D, Ergocalciferol, (DRISDOL) 1.25 MG (50000 UNIT) CAPS capsule Take 1 capsule (50,000 Units total) by mouth every 7 (seven) days. 4 capsule 2  . losartan (COZAAR) 50 MG tablet TAKE 1 TABLET (50 MG TOTAL) BY MOUTH DAILY. TO LOWER BLOOD PRESSURE (Patient taking differently: Take 50 mg by mouth daily.) 30 tablet 3   No facility-administered medications prior to visit.    No Known Allergies  ROS Review of Systems  Constitutional: Negative for chills and fever.  HENT: Negative.   Eyes: Negative.   Respiratory: Negative for shortness of breath.   Cardiovascular: Negative for chest pain.  Gastrointestinal: Negative.   Endocrine: Negative.    Genitourinary: Negative.   Musculoskeletal: Negative.   Skin: Negative.   Allergic/Immunologic: Negative.   Neurological: Negative.   Hematological: Negative.   Psychiatric/Behavioral: Negative.       Objective:     There were no vitals taken for this visit. Wt Readings from Last 3 Encounters:  07/09/20 266 lb (120.7 kg)  12/30/19 260 lb (117.9 kg)  05/01/18 250 lb (113.4 kg)     Health Maintenance Due  Topic Date Due  . PNEUMOCOCCAL POLYSACCHARIDE VACCINE AGE 60-64 HIGH RISK  Never done  . COVID-19 Vaccine (1) Never done  . FOOT EXAM  Never done  . OPHTHALMOLOGY EXAM  Never done  . TETANUS/TDAP  Never done    There are no preventive care reminders to display for this patient.  Lab Results  Component Value Date   TSH 0.942 03/28/2015   Lab Results  Component Value Date   WBC 7.6 07/09/2020   HGB 13.7 07/09/2020   HCT 43.0 07/09/2020   MCV 75 (L) 07/09/2020   PLT 336 07/09/2020   Lab Results  Component Value Date   NA 140 07/09/2020   K 4.3 07/09/2020   CO2 26 06/18/2020   GLUCOSE 158 (H) 07/09/2020   BUN 14 07/09/2020   CREATININE 1.50 (H) 07/09/2020   BILITOT 0.3 07/09/2020   ALKPHOS 68 07/09/2020   AST 11 07/09/2020   ALT 14 12/30/2019   PROT 6.4 07/09/2020   ALBUMIN 3.8 (L) 07/09/2020   CALCIUM 9.3 07/09/2020   ANIONGAP 8 06/18/2020   EGFR 64 07/09/2020   Lab Results  Component Value Date   CHOL 221 (H) 12/30/2019   Lab Results  Component Value Date   HDL 61 12/30/2019   Lab Results  Component Value Date   LDLCALC 133 (H) 12/30/2019   Lab Results  Component Value Date   TRIG 155 (H) 12/30/2019   Lab Results  Component Value Date   CHOLHDL 3.6 12/30/2019   Lab Results  Component Value Date   HGBA1C 11.0 (A) 07/09/2020      Assessment & Plan:   Problem List Items Addressed This Visit      Endocrine   Type 2 diabetes mellitus ([place]) - Primary (Chronic)   Relevant Medications   lisinopril (ZESTRIL) 10 MG tablet     Other    Microalbuminuria      Meds ordered this encounter  Medications  . lisinopril (ZESTRIL) 10 MG tablet    Sig: Take 1 tablet (10 mg total) by mouth daily.    Dispense:  30 tablet    Refill:  1    Stop losartan    Order Specific Question:   Supervising Provider    Answer:   Asencion Noble E [[id number]]   Assessment and Plan: 1. Type 2 diabetes mellitus with hyperglycemia, without long-term current use of  insulin ([place]) Reviewed lab result with patient, agreeable to change losartan to lisinopril.  Patient encouraged to check BP on a daily basis, keep a written log and have available for all office visits   2. Microalbuminuria    Follow Up Instructions:    I discussed the assessment and treatment plan with the patient. The patient was provided an opportunity to ask questions and all were answered. The patient agreed with the plan and demonstrated an understanding of the instructions.   The patient was advised to call back or seek an in-person evaluation if the symptoms worsen or if the condition fails to improve as anticipated.  I provided 12 minutes of non-face-to-face time during this encounter.       Follow-up: Keep f/u appt as scheduled    Harshan Kearley S Mayers, PA-C

## 2020-07-10 NOTE — Addendum Note (Signed)
Addended by: Roney Jaffe on: 07/10/2020 10:39 AM   Modules accepted: Orders

## 2020-07-11 NOTE — Patient Instructions (Signed)
You will stop the losartan and start lisinopril to help better manage your blood pressure and help protect your kidneys   Please check your blood pressure on a daily basis, keep a written log and have available for all office visits.  Roney Jaffe, PA-C Physician Assistant [facility] [email or web address]  Microalbumin Test Why am I having this test? Albumin is a protein in the body that helps regulate how much fluid is in the blood. Normally, when the kidneys filter waste from the bloodstream, waste leaves the body through urine, and important substances, such as albumin, remain in the blood. If the kidneys are damaged, they may no longer be able to keep albumin in the blood. This leads to small amounts of albumin (microalbumin, MA) in the urine. You may have this test:  If you have signs of kidney damage, such as foamy urine or swelling in the hands, feet, abdomen, or face.  To help evaluate kidney function after other kidney test results have been normal.  To monitor treatment for diabetes, also called diabetes mellitus, especially if your blood sugar (glucose) has been poorly controlled or you have signs of kidney damage. MA in the urine is a common complication of diabetes.  To monitor for complications caused by high blood pressure (hypertension).  To help diagnose cardiovascular disease or a heart attack. What is being tested? This test measures the amount of MA in your urine. You may have your creatinine level tested at the same time as MA testing. Creatinine is a waste product that the kidneys filter out of the blood. Testing creatinine levels provides more information about kidney function. What kind of sample is taken? A urine sample is required for this test. You may be asked to collect urine samples at home over a period of 24 hours.   How do I collect samples at home? When collecting a urine sample at home, make sure  you:  Use supplies and instructions that you received from the lab.  Collect urine only in the germ-free (sterile) cup that you received from the lab.  Do not let any toilet paper or stool (feces) get into the cup.  Refrigerate the sample until you can return it to the lab.  Return the sample(s) to the lab as instructed. How do I prepare for this test? Follow instructions from your health care provider about changing or stopping your regular medicines. This is especially important if you are taking diabetes medicines or blood thinners. Tell a health care provider about:  Any medical conditions you have.  All medicines you are taking, including vitamins, herbs, eye drops, creams, and over-the-counter medicines.  Whether you are pregnant or may be pregnant. How are the results reported? Your test results will be reported as a value that indicates how much MA is in your urine. This may be given as:  Milligrams of MA per deciliter of urine (mg/dL).  Milligrams of MA per gram of creatinine (mg/g creatinine), if creatinine was also tested. Your health care provider will compare your results to normal ranges that were established after testing a large group of people (reference ranges). Reference ranges may vary among labs and hospitals. For this test, common reference ranges are:  MA only: 0-2 mg/dL.  MA and creatinine: ? Men: 0-17 mg/g creatinine. ? Women: 0-25 mg/g creatinine. What do the results mean? A result that is within the reference range means that you have a normal amount of MA in your urine. This may mean  that:  Your kidneys are functioning normally.  Your diabetes or hypertension treatment is working effectively. Results that are higher than the reference range may mean that you have:  Poorly controlled diabetes. Your treatment plan may need to be adjusted.  Narrowing of the arteries caused by plaque buildup (atherosclerosis).  Kidney damage caused by hypertension  or diabetes (nephropathy). Talk with your health care provider about what your results mean. Questions to ask your health care provider Ask your health care provider, or the department that is doing the test:  When will my results be ready?  How will I get my results?  What are my treatment options?  What other tests do I need?  What are my next steps? Summary  Albumin is a protein that helps regulate how much fluid is in your blood.  If your kidneys are damaged, they may no longer be able to keep albumin in your blood. This leads to small amounts of albumin (microalbumin, MA) in your urine.  Results showing high MA levels may indicate nephropathy, which is kidney damage from diabetes or high blood pressure.  Talk with your health care provider about what your results mean. This information is not intended to replace advice given to you by your health care provider. Make sure you discuss any questions you have with your health care provider. Document Revised: 05/13/2019 Document Reviewed: 05/13/2019 Elsevier Patient Education  2021 [employer].

## 2020-07-13 ENCOUNTER — Other Ambulatory Visit: Payer: Self-pay

## 2020-08-21 ENCOUNTER — Telehealth: Payer: Self-pay | Admitting: Internal Medicine

## 2020-08-21 ENCOUNTER — Inpatient Hospital Stay: Payer: Self-pay | Admitting: Internal Medicine

## 2020-08-21 ENCOUNTER — Other Ambulatory Visit: Payer: Self-pay

## 2020-08-21 NOTE — Telephone Encounter (Signed)
Pharmacy has refills on file and will get ready for patient . 

## 2020-08-21 NOTE — Telephone Encounter (Unsigned)
Copied from CRM [id number]. Topic: Quick Communication - Rx Refill/Question >> Aug 21, 2020  9:54 AM Gaetana Michaelis A wrote: Medication: Rx #: [id number]  lisinopril (ZESTRIL) 10 MG tablet   Rx #: [id number]  Dulaglutide (TRULICITY) 1.5 MG/0.5ML SOPN   Rx #: [id number]  metFORMIN (GLUCOPHAGE-XR) 500 MG 24 hr tablet    Has the patient contacted their pharmacy? Yes.   (Agent: If no, request that the patient contact the pharmacy for the refill.) (Agent: If yes, when and what did the pharmacy advise?)  Preferred Pharmacy (with phone number or street name): [facility]  Phone:  [phone] Fax:  [phone]  Agent: Please be advised that RX refills may take up to 3 business days. We ask that you follow-up with your pharmacy.

## 2020-08-23 ENCOUNTER — Other Ambulatory Visit: Payer: Self-pay

## 2020-08-23 ENCOUNTER — Other Ambulatory Visit (HOSPITAL_COMMUNITY): Payer: Self-pay

## 2020-08-24 ENCOUNTER — Other Ambulatory Visit: Payer: Self-pay

## 2020-09-24 ENCOUNTER — Other Ambulatory Visit: Payer: Self-pay

## 2020-09-24 ENCOUNTER — Other Ambulatory Visit: Payer: Self-pay | Admitting: Physician Assistant

## 2020-09-24 MED ORDER — LISINOPRIL 10 MG PO TABS
10.0000 mg | ORAL_TABLET | Freq: Every day | ORAL | 0 refills | Status: DC
  Filled 2020-09-24: qty 30, 30d supply, fill #0

## 2020-09-27 ENCOUNTER — Telehealth: Payer: Self-pay | Admitting: Internal Medicine

## 2020-09-27 NOTE — Telephone Encounter (Signed)
Called to tell pt appt is now virtual due to provider working out of office

## 2020-10-01 ENCOUNTER — Ambulatory Visit: Payer: Self-pay | Attending: Internal Medicine | Admitting: Internal Medicine

## 2020-10-01 ENCOUNTER — Other Ambulatory Visit: Payer: Self-pay

## 2020-10-01 ENCOUNTER — Encounter: Payer: Self-pay | Admitting: Internal Medicine

## 2020-10-01 VITALS — Wt 250.0 lb

## 2020-10-01 DIAGNOSIS — Z23 Encounter for immunization: Secondary | ICD-10-CM

## 2020-10-01 DIAGNOSIS — N182 Chronic kidney disease, stage 2 (mild): Secondary | ICD-10-CM | POA: Insufficient documentation

## 2020-10-01 DIAGNOSIS — E1122 Type 2 diabetes mellitus with diabetic chronic kidney disease: Secondary | ICD-10-CM

## 2020-10-01 DIAGNOSIS — I129 Hypertensive chronic kidney disease with stage 1 through stage 4 chronic kidney disease, or unspecified chronic kidney disease: Secondary | ICD-10-CM

## 2020-10-01 DIAGNOSIS — E559 Vitamin D deficiency, unspecified: Secondary | ICD-10-CM

## 2020-10-01 DIAGNOSIS — Z6838 Body mass index (BMI) 38.0-38.9, adult: Secondary | ICD-10-CM

## 2020-10-01 DIAGNOSIS — E1121 Type 2 diabetes mellitus with diabetic nephropathy: Secondary | ICD-10-CM

## 2020-10-01 MED ORDER — METFORMIN HCL ER 500 MG PO TB24
1000.0000 mg | ORAL_TABLET | Freq: Two times a day (BID) | ORAL | 6 refills | Status: DC
  Filled 2020-10-31: qty 120, 30d supply, fill #0
  Filled 2020-12-18: qty 120, 30d supply, fill #1
  Filled 2021-02-22: qty 120, 30d supply, fill #2
  Filled 2021-03-22: qty 120, 30d supply, fill #0
  Filled 2021-05-15: qty 120, 30d supply, fill #1

## 2020-10-01 MED ORDER — TRULICITY 1.5 MG/0.5ML ~~LOC~~ SOAJ
1.5000 mg | SUBCUTANEOUS | 6 refills | Status: DC
  Filled 2020-10-31: qty 2, 28d supply, fill #0
  Filled 2020-12-05 – 2020-12-18 (×2): qty 2, 28d supply, fill #1
  Filled 2021-01-16: qty 2, 28d supply, fill #2
  Filled 2021-02-22: qty 2, 28d supply, fill #3
  Filled 2021-03-22: qty 2, 28d supply, fill #0
  Filled 2021-05-15: qty 2, 28d supply, fill #1
  Filled 2021-06-17: qty 2, 28d supply, fill #2

## 2020-10-01 MED ORDER — LISINOPRIL 10 MG PO TABS
10.0000 mg | ORAL_TABLET | Freq: Every day | ORAL | 1 refills | Status: DC
  Filled 2020-10-01: qty 90, 90d supply, fill #0
  Filled 2020-10-31: qty 30, 30d supply, fill #0
  Filled 2020-12-05 – 2020-12-18 (×2): qty 30, 30d supply, fill #1
  Filled 2021-01-16: qty 30, 30d supply, fill #2
  Filled 2021-02-22: qty 30, 30d supply, fill #3
  Filled 2021-03-22: qty 30, 30d supply, fill #0
  Filled 2021-05-15: qty 30, 30d supply, fill #1
  Filled 2021-06-17: qty 30, 30d supply, fill #2

## 2020-10-01 MED ORDER — VITAMIN D (ERGOCALCIFEROL) 1.25 MG (50000 UNIT) PO CAPS
50000.0000 [IU] | ORAL_CAPSULE | ORAL | 4 refills | Status: DC
  Filled 2020-10-01 – 2020-10-31 (×2): qty 4, 28d supply, fill #0
  Filled 2020-12-05 – 2020-12-18 (×2): qty 4, 28d supply, fill #1
  Filled 2021-01-16: qty 4, 28d supply, fill #2
  Filled 2021-02-22: qty 4, 28d supply, fill #3
  Filled 2021-03-22: qty 4, 28d supply, fill #0
  Filled 2021-05-15: qty 4, 28d supply, fill #1
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
## 2020-10-01 NOTE — Addendum Note (Signed)
Addended by: Jonah Blue B on: 10/01/2020 05:57 PM   Modules accepted: Orders, Level of Service

## 2020-10-08 ENCOUNTER — Other Ambulatory Visit: Payer: Self-pay

## 2020-10-10 ENCOUNTER — Other Ambulatory Visit: Payer: Self-pay

## 2020-10-12 ENCOUNTER — Telehealth: Payer: Self-pay | Admitting: Internal Medicine

## 2020-10-12 NOTE — Telephone Encounter (Signed)
-----   Message from Marcine Matar, MD sent at 10/01/2020 12:36 PM EDT ----- F/u with me in 4 mths. Give appt with Franky Macho to get Pneumovax 23 vaccine.

## 2020-10-12 NOTE — Telephone Encounter (Signed)
Patient need to be scheduled with Dr Laural Benes in Dec for a 4 month f/u and with [facility] for Pneumovax 23 vaccine. Spoke with Patient and he was at work. When he returns call please make his appointments

## 2020-10-31 ENCOUNTER — Other Ambulatory Visit: Payer: Self-pay

## 2020-12-05 ENCOUNTER — Other Ambulatory Visit: Payer: Self-pay | Admitting: Internal Medicine

## 2020-12-05 ENCOUNTER — Other Ambulatory Visit: Payer: Self-pay

## 2020-12-05 DIAGNOSIS — E1165 Type 2 diabetes mellitus with hyperglycemia: Secondary | ICD-10-CM

## 2020-12-05 MED ORDER — TRUE METRIX METER W/DEVICE KIT
PACK | 0 refills | Status: DC
  Filled 2020-12-05: qty 1, 365d supply, fill #0

## 2020-12-06 ENCOUNTER — Other Ambulatory Visit: Payer: Self-pay

## 2020-12-12 ENCOUNTER — Other Ambulatory Visit: Payer: Self-pay

## 2020-12-13 ENCOUNTER — Other Ambulatory Visit: Payer: Self-pay

## 2020-12-18 ENCOUNTER — Other Ambulatory Visit: Payer: Self-pay

## 2020-12-19 ENCOUNTER — Other Ambulatory Visit: Payer: Self-pay

## 2021-01-16 ENCOUNTER — Other Ambulatory Visit: Payer: Self-pay

## 2021-02-14 ENCOUNTER — Other Ambulatory Visit: Payer: Self-pay

## 2021-02-14 ENCOUNTER — Ambulatory Visit
Admission: EM | Admit: 2021-02-14 | Discharge: 2021-02-14 | Disposition: A | Discharge status: Home / Self Care | Payer: Self-pay | Attending: Physician Assistant | Admitting: Physician Assistant

## 2021-02-14 DIAGNOSIS — J209 Acute bronchitis, unspecified: Secondary | ICD-10-CM

## 2021-02-14 MED ORDER — PREDNISONE 20 MG PO TABS: 40.0000 mg | ORAL_TABLET | Freq: Every day | ORAL | 0 refills | Status: AC

## 2021-02-14 NOTE — ED Triage Notes (Signed)
Pt c/o cough with productive mucous, nasal congestion. States when he coughs it takes time for him to catch his breath and he has to "spit mucous."   Denies sore throat, headache, earaches, body aches and chills, nausea, vomiting, diarrhea, constipation, malaise, fatigue.   Onset ~ 1 week ago

## 2021-02-14 NOTE — ED Provider Notes (Signed)
[facility]    CSN: [id number] Arrival date & time: 02/14/21  1647      History   Chief Complaint Chief Complaint  Patient presents with   Cough    HPI Willie Spears is a 30 y.o. male.   Patient here today for evaluation of cough and congestion that is been ongoing for the last week.  He reports that cough has become somewhat productive of mucus.  He does have some wheezing at times.  He denies any other symptoms including fever, sore throat.  He has tried over-the-counter medication without significant relief.  The history is provided by the patient.  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 ([place]) 10/01/2020   Class 2 severe obesity with serious comorbidity and body mass index (BMI) of 38.0 to 38.9 in adult ([place]) 10/01/2020   Elevated blood pressure reading in office without diagnosis of hypertension 07/10/2020   Microalbuminuria 07/10/2020   Acute left-sided low back pain without sciatica 07/10/2020   AKI (acute kidney injury) ([place]) 06/18/2020   Scrotal abscess 05/01/2018   Type 2 diabetes mellitus ([place]) 03/29/2015   Vitamin D deficiency 03/29/2015   Fatigue 03/28/2015   Family history of diabetes mellitus 03/28/2015    Past Surgical History:  Procedure Laterality Date   IRRIGATION AND DEBRIDEMENT ABSCESS N/A 05/02/2018   Procedure: IRRIGATION AND DEBRIDEMENT ABSCESS scrotal;  Surgeon: [name], MD;  Location: [facility];  Service: Urology;  Laterality: N/A;       Home Medications    Prior to Admission medications   Medication Sig Start Date End Date Taking? Authorizing  Provider  predniSONE (DELTASONE) 20 MG tablet Take 2 tablets (40 mg total) by mouth daily with breakfast for 5 days. 02/14/21 02/19/21 Yes Francene Finders, PA-C  Blood Glucose Monitoring Suppl (TRUE METRIX METER) w/Device KIT Use to check blood sugar twice daily. 12/05/20   [name], MD  diclofenac Sodium (VOLTAREN) 1 % GEL Apply 4 g topically 4 (four) times daily. Patient not taking: Reported on 07/09/2020 06/28/20   Chase Picket, MD  Dulaglutide (TRULICITY) 1.5 TW/6.5KC SOPN Inject 1.5 mg into the skin once a week. 10/01/20   [name], MD  glucose blood (TRUE METRIX BLOOD GLUCOSE TEST) test strip Use as instructed to check blood sugar twice daily. 07/09/20   Mayers, Cari S, PA-C  lisinopril (ZESTRIL) 10 MG tablet Take 1 tablet (10 mg total) by mouth daily. 10/01/20   [name], MD  metFORMIN (GLUCOPHAGE-XR) 500 MG 24 hr tablet Take 2 tablets (1,000 mg total) by mouth 2 (two) times daily with a meal. 10/01/20   [name], MD  TRUEplus Lancets 28G MISC USE TO CHECK BLOOD SUGAR TWICE DAILY. 07/09/20 07/09/21  Mayers, Cari S, PA-C  Vitamin D, Ergocalciferol, (DRISDOL) 1.25 MG (50000 UNIT) CAPS capsule Take 1 capsule (50,000 Units total) by mouth every 7 (seven) days. 10/01/20   [name], MD  amLODipine (NORVASC) 10 MG tablet Take 1 tablet (10 mg total) by mouth daily. Patient not taking: Reported on 03/18/2019 05/06/18 03/18/19  [name],  MD  hydrochlorothiazide (HYDRODIURIL) 50 MG tablet Take 1 tablet (50 mg total) by mouth daily. Patient not taking: Reported on 03/18/2019 05/06/18 03/18/19  [name], MD  insulin aspart (NOVOLOG FLEXPEN) 100 UNIT/ML FlexPen Inject 5 Units into the skin 3 (three) times daily with meals for 30 days. Patient not taking: Reported on 03/18/2019 05/05/18 03/18/19  [name], MD  insulin regular (NOVOLIN R) 100 units/mL injection Inject 0.1 mLs (10 Units total) into the skin 3 (three) times daily before meals. 12/30/19 01/13/20   [name], MD    Family History Family History  Problem Relation Age of Onset   Diabetes Mother    Diabetes Father     Social History Social History   Tobacco Use   Smoking status: Every Day    Types: Cigarettes   Smokeless tobacco: Never  Vaping Use   Vaping Use: Never used  Substance Use Topics   Alcohol use: No   Drug use: Yes    Types: Marijuana     Allergies   Patient has no known allergies.   Review of Systems Review of Systems  Constitutional:  Negative for chills and fever.  HENT:  Positive for congestion. Negative for ear pain and sore throat.   Eyes:  Negative for discharge and redness.  Respiratory:  Positive for cough, shortness of breath and wheezing.   Gastrointestinal:  Negative for abdominal pain, nausea and vomiting.    Physical Exam Triage Vital Signs ED Triage Vitals [02/14/21 1840]  Enc Vitals Group     BP (!) 160/93     Pulse Rate 98     Resp 18     Temp 98.2 F (36.8 C)     Temp Source Oral     SpO2 98 %     Weight      Height      Head Circumference      Peak Flow      Pain Score 0     Pain Loc      Pain Edu?      Excl. in [place]?    No data found.  Updated Vital Signs BP (!) 160/93 (BP Location: Left Arm)    Pulse 98    Temp 98.2 F (36.8 C) (Oral)    Resp 18    SpO2 98%      Physical Exam Vitals and nursing note reviewed.  Constitutional:      General: He is not in acute distress.    Appearance: Normal appearance. He is not ill-appearing.  HENT:     Head: Normocephalic and atraumatic.     Nose: Nose normal. No congestion.  Eyes:     Conjunctiva/sclera: Conjunctivae normal.  Cardiovascular:     Rate and Rhythm: Normal rate and regular rhythm.     Heart sounds: Normal heart sounds. No murmur heard. Pulmonary:     Effort: Pulmonary effort is normal. No respiratory distress.     Breath sounds: Normal breath sounds. No wheezing, rhonchi or rales.  Skin:    General: Skin is warm and dry.  Neurological:     Mental  Status: He is alert.  Psychiatric:        Mood and Affect: Mood normal.        Thought Content: Thought content normal.     UC Treatments / Results  Labs (all labs ordered are listed, but only abnormal results are displayed) Labs Reviewed - No data to display  EKG   Radiology No results found.  Procedures Procedures (including critical care time)  Medications Ordered in UC Medications - No data to display  Initial Impression / Assessment and Plan / UC Course  I have reviewed the triage vital signs and the nursing notes.  Pertinent labs & imaging results that were available during my care of the patient were reviewed by me and considered in my medical decision making (see chart for details).    Suspect likely bronchitis and will treat with steroid burst.  Recommend follow-up if symptoms fail to improve or worsen.  Final Clinical Impressions(s) / UC Diagnoses   Final diagnoses:  Acute bronchitis, unspecified organism   Discharge Instructions   None    ED Prescriptions     Medication Sig Dispense Auth. Provider   predniSONE (DELTASONE) 20 MG tablet Take 2 tablets (40 mg total) by mouth daily with breakfast for 5 days. 10 tablet Francene Finders, PA-C      PDMP not reviewed this encounter.   Francene Finders, PA-C 02/14/21 1935

## 2021-02-22 ENCOUNTER — Other Ambulatory Visit: Payer: Self-pay

## 2021-03-01 ENCOUNTER — Other Ambulatory Visit: Payer: Self-pay

## 2021-03-22 ENCOUNTER — Other Ambulatory Visit: Payer: Self-pay

## 2021-03-27 ENCOUNTER — Other Ambulatory Visit: Payer: Self-pay

## 2021-05-15 ENCOUNTER — Other Ambulatory Visit (HOSPITAL_BASED_OUTPATIENT_CLINIC_OR_DEPARTMENT_OTHER): Payer: Self-pay

## 2021-05-15 ENCOUNTER — Other Ambulatory Visit: Payer: Self-pay

## 2021-06-17 ENCOUNTER — Other Ambulatory Visit: Payer: Self-pay | Admitting: Internal Medicine

## 2021-06-17 ENCOUNTER — Other Ambulatory Visit: Payer: Self-pay

## 2021-06-17 DIAGNOSIS — E559 Vitamin D deficiency, unspecified: Secondary | ICD-10-CM

## 2021-06-18 ENCOUNTER — Other Ambulatory Visit: Payer: Self-pay

## 2021-06-18 ENCOUNTER — Ambulatory Visit
Admission: RE | Admit: 2021-06-18 | Discharge: 2021-06-18 | Disposition: A | Discharge status: Home / Self Care | Payer: Self-pay | Source: Ambulatory Visit | Attending: Internal Medicine | Admitting: Internal Medicine

## 2021-06-18 VITALS — BP 153/103 | HR 87 | Temp 97.8°F | Resp 18

## 2021-06-18 DIAGNOSIS — T148XXA Other injury of unspecified body region, initial encounter: Secondary | ICD-10-CM

## 2021-06-18 MED ORDER — METHOCARBAMOL 500 MG PO TABS: 500.0000 mg | ORAL_TABLET | Freq: Two times a day (BID) | ORAL | 0 refills | Status: DC | PRN

## 2021-06-18 NOTE — ED Provider Notes (Addendum)
?[facility] ? ? ? ?CSN: [id number] ?Arrival date & time: 06/18/21  1052 ? ? ?  ? ?History   ?Chief Complaint ?Chief Complaint  ?Patient presents with  ? rib pain  ? Appointment  ?  1100  ? ? ?HPI ?Willie Spears is a 31 y.o. male.  ? ?Patient presents with left upper quadrant/rib pain that started approximately 3 days ago.  Denies any apparent injury to the area.  Pain is exacerbated with movement and with lying directly on the area.  Patient denies nausea, vomiting, diarrhea, abdominal pain, blood in stool, fever.  Denies any urinary symptoms including burning, penile discharge, urinary frequency, testicular pain.  Food does not aggravate pain.  Patient has taken ibuprofen with improvement in pain.  Denies chest pain or shortness of breath.  Denies headache, dizziness, blurred vision.  Pain is intermittent and is described as a "annoying pain" that he is not able to characterize and patient does not have pain currently.  Pain is mild in nature. ? ? ? ?Past Medical History:  ?Diagnosis Date  ? Diabetes mellitus without complication ([place])   ? Hypertension   ? ? ?Patient Active Problem List  ? Diagnosis Date Noted  ? Hypertension associated with stage 2 chronic kidney disease due to type 2 diabetes mellitus ([place]) 10/01/2020  ? Class 2 severe obesity with serious comorbidity and body mass index (BMI) of 38.0 to 38.9 in adult [facility]) 10/01/2020  ? Elevated blood pressure reading in office without diagnosis of hypertension 07/10/2020  ? Microalbuminuria 07/10/2020  ? Acute left-sided low back pain without sciatica 07/10/2020  ? AKI (acute kidney injury) ([place]) 06/18/2020  ? Scrotal abscess 05/01/2018  ? Type 2 diabetes mellitus ([place]) 03/29/2015  ? Vitamin D deficiency 03/29/2015  ? Fatigue 03/28/2015  ? Family history of diabetes mellitus 03/28/2015  ? ? ?Past Surgical History:  ?Procedure Laterality Date  ? IRRIGATION AND DEBRIDEMENT ABSCESS N/A 05/02/2018  ? Procedure: IRRIGATION AND DEBRIDEMENT ABSCESS scrotal;   Surgeon: [name], MD;  Location: [facility];  Service: Urology;  Laterality: N/A;  ? ? ? ? ? ?Home Medications   ? ?Prior to Admission medications   ?Medication Sig Start Date End Date Taking? Authorizing Provider  ?methocarbamol (ROBAXIN) 500 MG tablet Take 1 tablet (500 mg total) by mouth 2 (two) times daily as needed for muscle spasms. 06/18/21  Yes Teodora Medici, FNP  ?Blood Glucose Monitoring Suppl (TRUE METRIX METER) w/Device KIT Use to check blood sugar twice daily. 12/05/20   [name], MD  ?diclofenac Sodium (VOLTAREN) 1 % GEL Apply 4 g topically 4 (four) times daily. ?Patient not taking: Reported on 07/09/2020 06/28/20   Chase Picket, MD  ?Dulaglutide (TRULICITY) 1.5 QM/2.5OI SOPN Inject 1.5 mg into the skin once a week. 10/01/20   [name], MD  ?glucose blood (TRUE METRIX BLOOD GLUCOSE TEST) test strip Use as instructed to check blood sugar twice daily. 07/09/20   Mayers, Cari S, PA-C  ?lisinopril (ZESTRIL) 10 MG tablet Take 1 tablet (10 mg total) by mouth daily. 10/01/20   [name], MD  ?metFORMIN (GLUCOPHAGE-XR) 500 MG 24 hr tablet Take 2 tablets (1,000 mg total) by mouth 2 (two) times daily with a meal. 10/01/20   [name], MD  ?TRUEplus Lancets 28G MISC USE TO CHECK BLOOD SUGAR TWICE DAILY. 07/09/20 07/09/21  Mayers, Loraine Grip, PA-C  ?Vitamin D, Ergocalciferol, (DRISDOL) 1.25 MG (50000 UNIT) CAPS capsule Take 1 capsule (50,000 Units  total) by mouth every 7 (seven) days. 10/01/20   [name], MD  ?amLODipine (NORVASC) 10 MG tablet Take 1 tablet (10 mg total) by mouth daily. ?Patient not taking: Reported on 03/18/2019 05/06/18 03/18/19  [name], MD  ?hydrochlorothiazide (HYDRODIURIL) 50 MG tablet Take 1 tablet (50 mg total) by mouth daily. ?Patient not taking: Reported on 03/18/2019 05/06/18 03/18/19  [name], MD  ?insulin aspart (NOVOLOG FLEXPEN) 100 UNIT/ML FlexPen Inject 5 Units into the skin 3 (three) times daily with meals for 30 days. ?Patient  not taking: Reported on 03/18/2019 05/05/18 03/18/19  [name], MD  ?insulin regular (NOVOLIN R) 100 units/mL injection Inject 0.1 mLs (10 Units total) into the skin 3 (three) times daily before meals. 12/30/19 01/13/20  [name], MD  ? ? ?Family History ?Family History  ?Problem Relation Age of Onset  ? Diabetes Mother   ? Diabetes Father   ? ? ?Social History ?Social History  ? ?Tobacco Use  ? Smoking status: Every Day  ?  Types: Cigarettes  ? Smokeless tobacco: Never  ?Vaping Use  ? Vaping Use: Never used  ?Substance Use Topics  ? Alcohol use: No  ? Drug use: Yes  ?  Types: Marijuana  ? ? ? ?Allergies   ?Patient has no known allergies. ? ? ?Review of Systems ?Review of Systems ?Per HPI ? ?Physical Exam ?Triage Vital Signs ?ED Triage Vitals [06/18/21 1124]  ?Enc Vitals Group  ?   BP (!) 153/103  ?   Pulse Rate 87  ?   Resp 18  ?   Temp 97.8 ?F (36.6 ?C)  ?   Temp Source Oral  ?   SpO2 98 %  ?   Weight   ?   Height   ?   Head Circumference   ?   Peak Flow   ?   Pain Score 4  ?   Pain Loc   ?   Pain Edu?   ?   Excl. in [place]?   ? ?No data found. ? ?Updated Vital Signs ?BP (!) 153/103 (BP Location: Left Arm)   Pulse 87   Temp 97.8 ?F (36.6 ?C) (Oral)   Resp 18   SpO2 98%  ? ?Visual Acuity ?Right Eye Distance:   ?Left Eye Distance:   ?Bilateral Distance:   ? ?Right Eye Near:   ?Left Eye Near:    ?Bilateral Near:    ? ?Physical Exam ?Constitutional:   ?   General: He is not in acute distress. ?   Appearance: Normal appearance. He is not toxic-appearing or diaphoretic.  ?HENT:  ?   Head: Normocephalic and atraumatic.  ?Eyes:  ?   Extraocular Movements: Extraocular movements intact.  ?   Conjunctiva/sclera: Conjunctivae normal.  ?Cardiovascular:  ?   Rate and Rhythm: Normal rate and regular rhythm.  ?   Pulses: Normal pulses.  ?   Heart sounds: Normal heart sounds.  ?Pulmonary:  ?   Effort: Pulmonary effort is normal. No respiratory distress.  ?   Breath sounds: Normal breath sounds.  ?Chest:  ?   Chest wall: No  tenderness.  ?Abdominal:  ?   General: Bowel sounds are normal. There is no distension.  ?   Palpations: Abdomen is soft.  ?   Tenderness: There is no abdominal tenderness.  ?   Comments: No tenderness to palpation  ?Musculoskeletal:  ?   Cervical back: Normal.  ?   Thoracic back: Normal.  ?   Lumbar  back: Normal.  ?Neurological:  ?   General: No focal deficit present.  ?   Mental Status: He is alert and oriented to person, place, and time. Mental status is at baseline.  ?Psychiatric:     ?   Mood and Affect: Mood normal.     ?   Behavior: Behavior normal.     ?   Thought Content: Thought content normal.     ?   Judgment: Judgment normal.  ? ? ? ?UC Treatments / Results  ?Labs ?(all labs ordered are listed, but only abnormal results are displayed) ?Labs Reviewed - No data to display ? ?EKG ? ? ?Radiology ?No results found. ? ?Procedures ?Procedures (including critical care time) ? ?Medications Ordered in UC ?Medications - No data to display ? ?Initial Impression / Assessment and Plan / UC Course  ?I have reviewed the triage vital signs and the nursing notes. ? ?Pertinent labs & imaging results that were available during my care of the patient were reviewed by me and considered in my medical decision making (see chart for details). ? ?  ? ?Patient's pain and physical exam appears musculoskeletal in etiology.  Do not think the patient is in need of emergent evaluation at the hospital at this time or in need of imaging.  Will prescribe muscle relaxer at low dose as this should be safe with CKD.  Patient was advised that it can cause drowsiness.  Advised patient to avoid ibuprofen given CKD.  Patient may take Tylenol.  Discussed ice application as well.  Patient advised to go the ER if symptoms persist or worsen.  Patient verbalized understanding and was agreeable with plan. ?Final Clinical Impressions(s) / UC Diagnoses  ? ?Final diagnoses:  ?Muscle strain  ? ? ? ?Discharge Instructions   ? ?  ?It appears that you have  a muscle strain which is being treated with a muscle relaxer.  Please be advised that muscle relaxer can cause drowsiness.  Follow-up with the ER if symptoms persist or worsen. ? ? ? ?ED Prescriptions   ?

## 2021-06-18 NOTE — Discharge Instructions (Signed)
It appears that you have a muscle strain which is being treated with a muscle relaxer.  Please be advised that muscle relaxer can cause drowsiness.  Follow-up with the ER if symptoms persist or worsen. ?

## 2021-06-18 NOTE — ED Triage Notes (Signed)
Pt here for left rib pain worse with movement and positioning x 3 days  ?

## 2021-06-19 ENCOUNTER — Other Ambulatory Visit: Payer: Self-pay

## 2021-06-19 ENCOUNTER — Encounter: Payer: Self-pay | Admitting: Critical Care Medicine

## 2021-06-19 ENCOUNTER — Ambulatory Visit: Payer: Self-pay | Attending: Critical Care Medicine | Admitting: Critical Care Medicine

## 2021-06-19 ENCOUNTER — Other Ambulatory Visit: Payer: Self-pay | Admitting: Pharmacist

## 2021-06-19 VITALS — BP 167/112 | HR 86 | Resp 16 | Ht 68.0 in | Wt 271.0 lb

## 2021-06-19 DIAGNOSIS — I129 Hypertensive chronic kidney disease with stage 1 through stage 4 chronic kidney disease, or unspecified chronic kidney disease: Secondary | ICD-10-CM

## 2021-06-19 DIAGNOSIS — E1121 Type 2 diabetes mellitus with diabetic nephropathy: Secondary | ICD-10-CM

## 2021-06-19 DIAGNOSIS — E1165 Type 2 diabetes mellitus with hyperglycemia: Secondary | ICD-10-CM

## 2021-06-19 DIAGNOSIS — E559 Vitamin D deficiency, unspecified: Secondary | ICD-10-CM

## 2021-06-19 DIAGNOSIS — N492 Inflammatory disorders of scrotum: Secondary | ICD-10-CM

## 2021-06-19 DIAGNOSIS — Z87891 Personal history of nicotine dependence: Secondary | ICD-10-CM | POA: Insufficient documentation

## 2021-06-19 DIAGNOSIS — Z6833 Body mass index (BMI) 33.0-33.9, adult: Secondary | ICD-10-CM

## 2021-06-19 DIAGNOSIS — E1122 Type 2 diabetes mellitus with diabetic chronic kidney disease: Secondary | ICD-10-CM

## 2021-06-19 DIAGNOSIS — N182 Chronic kidney disease, stage 2 (mild): Secondary | ICD-10-CM

## 2021-06-19 DIAGNOSIS — R809 Proteinuria, unspecified: Secondary | ICD-10-CM

## 2021-06-19 LAB — POCT GLYCOSYLATED HEMOGLOBIN (HGB A1C): HbA1c, POC (controlled diabetic range): 11.5 % — AB (ref 0.0–7.0)

## 2021-06-19 LAB — POCT CBG (FASTING - GLUCOSE)-MANUAL ENTRY: Glucose Fasting, POC: 224 mg/dL — AB (ref 70–99)

## 2021-06-19 MED ORDER — VALSARTAN-HYDROCHLOROTHIAZIDE 160-25 MG PO TABS
1.0000 | ORAL_TABLET | Freq: Every day | ORAL | 3 refills | Status: DC
  Filled 2021-06-19: qty 30, 30d supply, fill #0
  Filled 2021-08-06 – 2021-08-23 (×2): qty 30, 30d supply, fill #1

## 2021-06-19 MED ORDER — METFORMIN HCL ER 500 MG PO TB24
1000.0000 mg | ORAL_TABLET | Freq: Two times a day (BID) | ORAL | 6 refills | Status: DC
  Filled 2021-06-19: qty 120, 30d supply, fill #0
  Filled 2021-08-06: qty 120, 30d supply, fill #1

## 2021-06-19 MED ORDER — AMLODIPINE BESYLATE 10 MG PO TABS
5.0000 mg | ORAL_TABLET | Freq: Every day | ORAL | 2 refills | Status: DC
  Filled 2021-06-19: qty 60, 120d supply, fill #0

## 2021-06-19 MED ORDER — TRUEPLUS LANCETS 28G MISC
2 refills | Status: AC
  Filled 2021-06-19: qty 100, 50d supply, fill #0

## 2021-06-19 MED ORDER — AMLODIPINE BESYLATE 10 MG PO TABS
10.0000 mg | ORAL_TABLET | Freq: Every day | ORAL | 2 refills | Status: DC
Start: 2021-06-19 — End: 2021-11-21
  Filled 2021-06-19: qty 30, 30d supply, fill #0
  Filled 2021-08-06 – 2021-08-23 (×2): qty 30, 30d supply, fill #1

## 2021-06-19 MED ORDER — ATORVASTATIN CALCIUM 20 MG PO TABS
20.0000 mg | ORAL_TABLET | Freq: Every day | ORAL | 3 refills | Status: DC
  Filled 2021-06-19: qty 30, 30d supply, fill #0
  Filled 2021-08-06: qty 30, 30d supply, fill #1
  Filled 2021-08-23: qty 90, 90d supply, fill #1

## 2021-06-19 MED ORDER — TRULICITY 1.5 MG/0.5ML ~~LOC~~ SOAJ
1.5000 mg | SUBCUTANEOUS | 1 refills | Status: DC
  Filled 2021-06-19 – 2021-08-06 (×2): qty 2, 28d supply, fill #0

## 2021-06-19 MED ORDER — TRULICITY 3 MG/0.5ML ~~LOC~~ SOAJ
3.0000 mg | SUBCUTANEOUS | 3 refills | Status: DC
  Filled 2021-06-19: qty 2, 28d supply, fill #0

## 2021-06-19 MED ORDER — TRUE METRIX METER W/DEVICE KIT
PACK | 0 refills | Status: AC
  Filled 2021-06-19: qty 1, 1d supply, fill #0

## 2021-06-19 MED ORDER — TRUE METRIX BLOOD GLUCOSE TEST VI STRP
ORAL_STRIP | 2 refills | Status: AC
  Filled 2021-06-19: qty 50, 25d supply, fill #0

## 2021-06-19 NOTE — Assessment & Plan Note (Signed)
Hypertension not well controlled plan to discontinue lisinopril and begin valsartan HCT 160/25 daily and amlodipine 10 mg daily ? ?We will reassess metabolic panel and renal function ? ?I went over with the patient a lifestyle medicine handout including improvements to his diet and exercise program ? ?I confirm the patient is no longer using tobacco products he does used to marijuana blunts daily asked him to minimize this ?

## 2021-06-19 NOTE — Assessment & Plan Note (Signed)
Patient has morbid obesity with significant comorbidity of hypertension and diabetes ? ?I tried to spend extra time this visit 20 minutes plus engaging him on lifestyle management ?

## 2021-06-19 NOTE — Progress Notes (Signed)
? ?Established Patient Office Visit ? ?Subjective   ?Patient ID: Willie Spears, male    DOB: 09-03-1990  Age: 31 y.o. MRN: [id number] ? ?Chief Complaint  ?Patient presents with  ? Hypertension  ? Diabetes  ? Medication Management  ? ? ?HPI ?PCP of [name] last seen 10/2020 ?This is a pleasant 31 year old male last seen in May 2022 face-to-face ? ?Patient's had several telemedicine visits since that time ?On arrival today the blood sugar is elevated at 224 A1c is still 11.  He has been taking Trulicity 1.5 mg weekly and metformin 1000 mg twice daily.  He has not been compliant with exercise or diet.  He is due a pneumonia vaccine and is yet to receive it.  He does need a foot exam as well today.  He has no real specific complaints.  He tends to eat at fast food restaurants.  He no longer is smoking tobacco products.  He has been taking the lisinopril 10 mg daily but arrival blood pressure is 167/112. ? ?Patient's had vitamin D deficiency and was on vitamin D supplementation now is taking a multivitamin. ? ? ?Review of Systems  ?Constitutional:  Negative for chills, diaphoresis, fever, malaise/fatigue and weight loss.  ?HENT:  Negative for congestion, ear discharge, ear pain, hearing loss, nosebleeds, sore throat and tinnitus.   ?Eyes:  Negative for blurred vision, double vision, photophobia and discharge.  ?Respiratory:  Negative for cough, hemoptysis, sputum production, shortness of breath, wheezing and stridor.   ?     No excess mucus  ?Cardiovascular:  Negative for chest pain, palpitations, orthopnea, claudication, leg swelling and PND.  ?Gastrointestinal:  Negative for abdominal pain, blood in stool, constipation, diarrhea, heartburn, melena, nausea and vomiting.  ?Genitourinary:  Negative for dysuria, flank pain, frequency, hematuria and urgency.  ?Musculoskeletal:  Negative for back pain, falls, joint pain, myalgias and neck pain.  ?Skin:  Negative for itching and rash.  ?Neurological:  Negative for dizziness,  tingling, tremors, sensory change, speech change, focal weakness, seizures, loss of consciousness, weakness and headaches.  ?Endo/Heme/Allergies:  Negative for environmental allergies and polydipsia. Does not bruise/bleed easily.  ?Psychiatric/Behavioral:  Negative for depression, hallucinations, memory loss, substance abuse and suicidal ideas. The patient is not nervous/anxious and does not have insomnia.   ?All other systems reviewed and are negative. ? ?  ?Objective:  ?  ? ?BP (!) 167/112   Pulse 86   Resp 16   Ht 5' 8"  (1.727 m)   Wt 271 lb (122.9 kg)   SpO2 99%   BMI 41.21 kg/m?  ? ? ?Physical Exam ?Vitals reviewed.  ?Constitutional:   ?   Appearance: Normal appearance. He is well-developed. He is obese. He is not diaphoretic.  ?HENT:  ?   Head: Normocephalic and atraumatic.  ?   Nose: Nose normal. No nasal deformity, septal deviation, mucosal edema or rhinorrhea.  ?   Right Sinus: No maxillary sinus tenderness or frontal sinus tenderness.  ?   Left Sinus: No maxillary sinus tenderness or frontal sinus tenderness.  ?   Mouth/Throat:  ?   Mouth: Mucous membranes are moist.  ?   Pharynx: Oropharynx is clear. No oropharyngeal exudate.  ?   Comments: Good dentition ?Eyes:  ?   General: No scleral icterus. ?   Conjunctiva/sclera: Conjunctivae normal.  ?   Pupils: Pupils are equal, round, and reactive to light.  ?Neck:  ?   Thyroid: No thyromegaly.  ?   Vascular: No carotid bruit or JVD.  ?  Trachea: Trachea normal. No tracheal tenderness or tracheal deviation.  ?Cardiovascular:  ?   Rate and Rhythm: Normal rate and regular rhythm.  ?   Chest Wall: PMI is not displaced.  ?   Pulses: Normal pulses. No decreased pulses.  ?   Heart sounds: Normal heart sounds, S1 normal and S2 normal. Heart sounds not distant. No murmur heard. ?No systolic murmur is present.  ?No diastolic murmur is present.  ?  No friction rub. No gallop. No S3 or S4 sounds.  ?Pulmonary:  ?   Effort: No tachypnea, accessory muscle usage or  respiratory distress.  ?   Breath sounds: No stridor. No decreased breath sounds, wheezing, rhonchi or rales.  ?Chest:  ?   Chest wall: No tenderness.  ?Abdominal:  ?   General: Bowel sounds are normal. There is no distension.  ?   Palpations: Abdomen is soft. Abdomen is not rigid.  ?   Tenderness: There is no abdominal tenderness. There is no guarding or rebound.  ?Musculoskeletal:     ?   General: Normal range of motion.  ?   Cervical back: Normal range of motion and neck supple. No edema, erythema or rigidity. No muscular tenderness. Normal range of motion.  ?   Comments: Foot exam normal  ?Lymphadenopathy:  ?   Head:  ?   Right side of head: No submental or submandibular adenopathy.  ?   Left side of head: No submental or submandibular adenopathy.  ?   Cervical: No cervical adenopathy.  ?Skin: ?   General: Skin is warm and dry.  ?   Coloration: Skin is not pale.  ?   Findings: No rash.  ?   Nails: There is no clubbing.  ?Neurological:  ?   Mental Status: He is alert and oriented to person, place, and time.  ?   Sensory: No sensory deficit.  ?Psychiatric:     ?   Speech: Speech normal.     ?   Behavior: Behavior normal.  ? ? ? ?Results for orders placed or performed in visit on 06/19/21  ?HgB A1c  ?Result Value Ref Range  ? Hemoglobin A1C    ? HbA1c POC (<> result, manual entry)    ? HbA1c, POC (prediabetic range)    ? HbA1c, POC (controlled diabetic range) 11.5 (A) 0.0 - 7.0 %  ?Glucose (CBG), Fasting  ?Result Value Ref Range  ? Glucose Fasting, POC 224 (A) 70 - 99 mg/dL  ? ? ? ? ?The ASCVD Risk score (Arnett DK, et al., 2019) failed to calculate for the following reasons: ?  The 2019 ASCVD risk score is only valid for ages 57 to 61 ? ?  ?Assessment & Plan:  ? ?Problem List Items Addressed This Visit   ? ?  ? Cardiovascular and Mediastinum  ? Hypertension associated with stage 2 chronic kidney disease due to type 2 diabetes mellitus ([place])  ?  Hypertension not well controlled plan to discontinue lisinopril and  begin valsartan HCT 160/25 daily and amlodipine 10 mg daily ? ?We will reassess metabolic panel and renal function ? ?I went over with the patient a lifestyle medicine handout including improvements to his diet and exercise program ? ?I confirm the patient is no longer using tobacco products he does used to marijuana blunts daily asked him to minimize this ? ?  ?  ? Relevant Medications  ? atorvastatin (LIPITOR) 20 MG tablet  ? Dulaglutide (TRULICITY) 3 DG/6.4QI SOPN  ? metFORMIN (GLUCOPHAGE-XR)  500 MG 24 hr tablet  ? valsartan-hydrochlorothiazide (DIOVAN-HCT) 160-25 MG tablet  ? amLODipine (NORVASC) 10 MG tablet  ? Other Relevant Orders  ? Comprehensive metabolic panel  ? CBC with Differential/Platelet  ?  ? Endocrine  ? Type 2 diabetes mellitus (HCC) - Primary (Chronic)  ?  Significant type 2 diabetes not any improvement in his current program plan is to increase Trulicity to 3 mg weekly continue metformin twice daily ? ?I spent about 20 minutes going over and exercise and improved plant-based diet using the lifestyle medicine handout.  I emphasized several points in the handout asked him to take this to [facility] of [facility] local [facility].  [place] to the [place]. ? ?He will be seen in short-term follow-up by clinical pharmacy and then Dr. [name] or myself in 6 weeks ? ?  ?  ? Relevant Medications  ? atorvastatin (LIPITOR) 20 MG tablet  ? Dulaglutide (TRULICITY) 3 CZ/6.6AY SOPN  ? metFORMIN (GLUCOPHAGE-XR) 500 MG 24 hr tablet  ? valsartan-hydrochlorothiazide (DIOVAN-HCT) 160-25 MG tablet  ? TRUEplus Lancets 28G MISC  ? glucose blood (TRUE METRIX BLOOD GLUCOSE TEST) test strip  ? Blood Glucose Monitoring Suppl (TRUE METRIX METER) w/Device KIT  ? Other Relevant Orders  ? HgB A1c (Completed)  ? Glucose (CBG), Fasting (Completed)  ? Comprehensive metabolic panel  ? Lipid panel  ?  ? Genitourinary  ? RESOLVED: Scrotal abscess  ?  This has resolved this ? ?  ?  ?  ? Other  ? Vitamin D deficiency  (Chronic)  ?  Plan to have the patient switch to oral over-the-counter vitamin D at this time and follow a healthier diet ? ?  ?  ? Microalbuminuria  ?  Patient had elevated albumin in the past in the urine this will have t

## 2021-06-19 NOTE — Progress Notes (Signed)
Wants to get off Trulicity  ?F/u DM  ? ?

## 2021-06-19 NOTE — Patient Instructions (Signed)
Follow recommendations we discussed on your lifestyle medicine handout with regards to your diet ? ?Continue metformin 2 tablets twice daily ? ?Increase Trulicity to 3 mg injection weekly ? ?Begin atorvastatin 1 pill daily for cholesterol ? ?Begin valsartan HCT 1 pill daily for high blood pressure ? ?Begin amlodipine 1 pill daily for high blood pressure ? ?Discontinue lisinopril ? ?Stay on a multivitamin daily for vitamin D supplementation the vitamin D tablet we have given you previously will not be renewed ? ?Focus on complete smoking cessation this is an important measure to reducing your blood pressure see attachment ? ?Pneumonia vaccine was given ? ?Labs today include metabolic panel blood count lipid panel ? ?Note your hemoglobin A1c is unchanged from a year ago it is still 11 ? ?Return to see Franky Macho our clinical pharmacist for your blood pressure and your diabetes in the next 3 weeks then see Dr. Laural Benes in 6 weeks for Dr. Delford Field if Dr. Laural Benes is not available ? ? ?

## 2021-06-19 NOTE — Assessment & Plan Note (Signed)
Not currently smoking at this time 

## 2021-06-19 NOTE — Assessment & Plan Note (Signed)
Significant type 2 diabetes not any improvement in his current program plan is to increase Trulicity to 3 mg weekly continue metformin twice daily ? ?I spent about 20 minutes going over and exercise and improved plant-based diet using the lifestyle medicine handout.  I emphasized several points in the handout asked him to take this to [facility].  He lives very close to the downtown [place]. ? ?He will be seen in short-term follow-up by clinical pharmacy and then Dr. [name] or myself in 6 weeks ?

## 2021-06-19 NOTE — Assessment & Plan Note (Signed)
Patient had elevated albumin in the past in the urine this will have to be followed ?

## 2021-06-19 NOTE — Assessment & Plan Note (Signed)
Plan to have the patient switch to oral over-the-counter vitamin D at this time and follow a healthier diet ?

## 2021-06-19 NOTE — Assessment & Plan Note (Signed)
This has resolved this ?

## 2021-06-20 ENCOUNTER — Telehealth: Payer: Self-pay

## 2021-06-20 ENCOUNTER — Other Ambulatory Visit: Payer: Self-pay

## 2021-06-20 LAB — COMPREHENSIVE METABOLIC PANEL WITH GFR
ALT: 15 IU/L (ref 0–44)
AST: 15 IU/L (ref 0–40)
Albumin/Globulin Ratio: 1.6 (ref 1.2–2.2)
Albumin: 3.8 g/dL — ABNORMAL LOW (ref 4.1–5.2)
Alkaline Phosphatase: 94 IU/L (ref 44–121)
BUN/Creatinine Ratio: 11 (ref 9–20)
BUN: 18 mg/dL (ref 6–20)
Bilirubin Total: <0.2 mg/dL (ref 0.0–1.2)
CO2: 21 mmol/L (ref 20–29)
Calcium: 9.4 mg/dL (ref 8.7–10.2)
Chloride: 103 mmol/L (ref 96–106)
Creatinine, Ser: 1.66 mg/dL — ABNORMAL HIGH (ref 0.76–1.27)
Globulin, Total: 2.4 g/dL (ref 1.5–4.5)
Glucose: 226 mg/dL — ABNORMAL HIGH (ref 70–99)
Potassium: 4.5 mmol/L (ref 3.5–5.2)
Sodium: 138 mmol/L (ref 134–144)
Total Protein: 6.2 g/dL (ref 6.0–8.5)
eGFR: 57 mL/min/1.73 — ABNORMAL LOW

## 2021-06-20 LAB — CBC WITH DIFFERENTIAL/PLATELET
Basophils Absolute: 0.1 10*3/uL (ref 0.0–0.2)
Basos: 1 %
EOS (ABSOLUTE): 0.2 10*3/uL (ref 0.0–0.4)
Eos: 2 %
Hematocrit: 45.8 % (ref 37.5–51.0)
Hemoglobin: 14.5 g/dL (ref 13.0–17.7)
Immature Grans (Abs): 0 10*3/uL (ref 0.0–0.1)
Immature Granulocytes: 0 %
Lymphocytes Absolute: 2.6 10*3/uL (ref 0.7–3.1)
Lymphs: 31 %
MCH: 24.2 pg — ABNORMAL LOW (ref 26.6–33.0)
MCHC: 31.7 g/dL (ref 31.5–35.7)
MCV: 77 fL — ABNORMAL LOW (ref 79–97)
Monocytes Absolute: 0.5 10*3/uL (ref 0.1–0.9)
Monocytes: 6 %
Neutrophils Absolute: 4.8 10*3/uL (ref 1.4–7.0)
Neutrophils: 60 %
Platelets: 382 10*3/uL (ref 150–450)
RBC: 5.98 x10E6/uL — ABNORMAL HIGH (ref 4.14–5.80)
RDW: 13.1 % (ref 11.6–15.4)
WBC: 8.1 10*3/uL (ref 3.4–10.8)

## 2021-06-20 LAB — LIPID PANEL
Chol/HDL Ratio: 3.6 ratio (ref 0.0–5.0)
Cholesterol, Total: 212 mg/dL — ABNORMAL HIGH (ref 100–199)
HDL: 59 mg/dL
LDL Chol Calc (NIH): 123 mg/dL — ABNORMAL HIGH (ref 0–99)
Triglycerides: 170 mg/dL — ABNORMAL HIGH (ref 0–149)
VLDL Cholesterol Cal: 30 mg/dL (ref 5–40)

## 2021-06-20 NOTE — Telephone Encounter (Signed)
Pt was called and is aware of results, DOB was confirmed.  ?

## 2021-06-20 NOTE — Telephone Encounter (Signed)
-----   Message from Elsie Stain, MD sent at 06/20/2021  6:21 AM EDT ----- ?Let pt know blood sugar high as we expected, liver normal, there is mild kidney damage from high blood sugar that is controllable if he gets his diet improved and lowers blood sugar, cholesterol high so start cholesterol pill , blood counts normal ?

## 2021-06-21 ENCOUNTER — Other Ambulatory Visit: Payer: Self-pay

## 2021-07-08 ENCOUNTER — Other Ambulatory Visit: Payer: Self-pay

## 2021-07-16 ENCOUNTER — Other Ambulatory Visit: Payer: Self-pay | Admitting: Pharmacist

## 2021-07-16 ENCOUNTER — Ambulatory Visit: Payer: Self-pay | Admitting: Pharmacist

## 2021-07-16 ENCOUNTER — Other Ambulatory Visit: Payer: Self-pay

## 2021-07-16 MED ORDER — SEMAGLUTIDE (2 MG/DOSE) 8 MG/3ML ~~LOC~~ SOPN
2.0000 mg | PEN_INJECTOR | SUBCUTANEOUS | 3 refills | Status: DC
  Filled 2021-07-16: qty 3, fill #0
  Filled 2021-08-22: qty 12, 112d supply, fill #0

## 2021-07-19 ENCOUNTER — Other Ambulatory Visit: Payer: Self-pay

## 2021-07-31 NOTE — Progress Notes (Deleted)
Established Patient Office Visit  Subjective   Patient ID: Willie Spears, male    DOB: 1990-04-12  Age: 31 y.o. MRN: [id number]  No chief complaint on file.   HPI 06/2021 PCP of [name] last seen 10/2020 This is a pleasant 31 year old male last seen in May 2022 face-to-face  Patient's had several telemedicine visits since that time On arrival today the blood sugar is elevated at 224 A1c is still 11.  He has been taking Trulicity 1.5 mg weekly and metformin 1000 mg twice daily.  He has not been compliant with exercise or diet.  He is due a pneumonia vaccine and is yet to receive it.  He does need a foot exam as well today.  He has no real specific complaints.  He tends to eat at fast food restaurants.  He no longer is smoking tobacco products.  He has been taking the lisinopril 10 mg daily but arrival blood pressure is 167/112.  Patient's had vitamin D deficiency and was on vitamin D supplementation now is taking a multivitamin.  08/01/21  Hypertension associated with stage 2 chronic kidney disease due to type 2 diabetes mellitus ([place])    Hypertension not well controlled plan to discontinue lisinopril and begin valsartan HCT 160/25 daily and amlodipine 10 mg daily  We will reassess metabolic panel and renal function  I went over with the patient a lifestyle medicine handout including improvements to his diet and exercise program  I confirm the patient is no longer using tobacco products he does used to marijuana blunts daily asked him to minimize this       Relevant Medications   atorvastatin (LIPITOR) 20 MG tablet   Dulaglutide (TRULICITY) 3 YI/0.1KP SOPN   metFORMIN (GLUCOPHAGE-XR) 500 MG 24 hr tablet   valsartan-hydrochlorothiazide (DIOVAN-HCT) 160-25 MG tablet   amLODipine (NORVASC) 10 MG tablet   Other Relevant Orders   Comprehensive metabolic panel   CBC with Differential/Platelet     Endocrine   Type 2 diabetes mellitus ([place]) - Primary (Chronic)    Significant type 2  diabetes not any improvement in his current program plan is to increase Trulicity to 3 mg weekly continue metformin twice daily  I spent about 20 minutes going over and exercise and improved plant-based diet using the lifestyle medicine handout.  I emphasized several points in the handout asked him to take this [facility].  He lives very close to the downtown [place].  He will be seen in short-term follow-up by clinical pharmacy and then Dr. [name] or myself in 6 weeks       Relevant Medications   atorvastatin (LIPITOR) 20 MG tablet   Dulaglutide (TRULICITY) 3 VV/7.4MO SOPN   metFORMIN (GLUCOPHAGE-XR) 500 MG 24 hr tablet   valsartan-hydrochlorothiazide (DIOVAN-HCT) 160-25 MG tablet   TRUEplus Lancets 28G MISC   glucose blood (TRUE METRIX BLOOD GLUCOSE TEST) test strip   Blood Glucose Monitoring Suppl (TRUE METRIX METER) w/Device KIT   Other Relevant Orders   HgB A1c (Completed)   Glucose (CBG), Fasting (Completed)   Comprehensive metabolic panel   Lipid panel     Genitourinary   RESOLVED: Scrotal abscess    This has resolved this         Other   Vitamin D deficiency (Chronic)    Plan to have the patient switch to oral over-the-counter vitamin D at this time and follow a healthier diet       Microalbuminuria  Patient had elevated albumin in the past in the urine this will have to be followed       Obesity, Class III, BMI 40-49.9 (morbid obesity) ([place])    Patient has morbid obesity with significant comorbidity of hypertension and diabetes  I tried to spend extra time this visit 20 minutes plus engaging him on lifestyle management       Relevant Medications   Dulaglutide (TRULICITY) 3 LY/6.5KP SOPN   metFORMIN (GLUCOPHAGE-XR) 500 MG 24 hr tablet   Former tobacco use    Not currently smoking at this time       Other Visit Diagnoses     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  HENT:  Negative for congestion, ear discharge, ear pain, hearing loss, nosebleeds, sore throat and tinnitus.   Eyes:  Negative for blurred vision, double vision, photophobia and discharge.  Respiratory:  Negative for cough, hemoptysis, sputum production, shortness of breath, wheezing and stridor.        No excess mucus  Cardiovascular:  Negative for chest pain, palpitations, orthopnea, claudication, leg swelling and PND.  Gastrointestinal:  Negative for abdominal pain, blood in stool, constipation, diarrhea, heartburn, melena, nausea and vomiting.  Genitourinary:  Negative for dysuria, flank pain, frequency, hematuria and urgency.  Musculoskeletal:  Negative for back pain, falls, joint pain, myalgias and neck pain.  Skin:  Negative for itching and rash.  Neurological:  Negative for dizziness, tingling, tremors, sensory change, speech change, focal weakness, seizures, loss of consciousness, weakness and headaches.  Endo/Heme/Allergies:  Negative for environmental allergies and polydipsia. Does not bruise/bleed easily.  Psychiatric/Behavioral:  Negative for depression, hallucinations, memory loss, substance abuse and suicidal ideas. The patient is not nervous/anxious and does not have insomnia.   All other systems reviewed and are negative.    Objective:     There were no vitals taken for this visit.   Physical Exam Vitals reviewed.  Constitutional:      Appearance: Normal appearance. He is well-developed. He is obese. He is not diaphoretic.  HENT:     Head: Normocephalic and atraumatic.     Nose: Nose normal. No nasal deformity, septal  deviation, mucosal edema or rhinorrhea.     Right Sinus: No maxillary sinus tenderness or frontal sinus tenderness.     Left Sinus: No maxillary sinus tenderness or frontal sinus tenderness.     Mouth/Throat:     Mouth: Mucous membranes are moist.     Pharynx: Oropharynx is clear. No oropharyngeal exudate.     Comments: Good dentition Eyes:     General: No scleral icterus.    Conjunctiva/sclera: Conjunctivae normal.     Pupils: Pupils are equal, round, and reactive to light.  Neck:     Thyroid: No thyromegaly.     Vascular: No carotid bruit or JVD.     Trachea: Trachea normal. No tracheal tenderness or tracheal deviation.  Cardiovascular:     Rate and Rhythm: Normal rate and regular rhythm.     Chest Wall: PMI is not displaced.     Pulses: Normal pulses. No decreased pulses.     Heart  sounds: Normal heart sounds, S1 normal and S2 normal. Heart sounds not distant. No murmur heard. No systolic murmur is present.  No diastolic murmur is present.    No friction rub. No gallop. No S3 or S4 sounds.  Pulmonary:     Effort: No tachypnea, accessory muscle usage or respiratory distress.     Breath sounds: No stridor. No decreased breath sounds, wheezing, rhonchi or rales.  Chest:     Chest wall: No tenderness.  Abdominal:     General: Bowel sounds are normal. There is no distension.     Palpations: Abdomen is soft. Abdomen is not rigid.     Tenderness: There is no abdominal tenderness. There is no guarding or rebound.  Musculoskeletal:        General: Normal range of motion.     Cervical back: Normal range of motion and neck supple. No edema, erythema or rigidity. No muscular tenderness. Normal range of motion.     Comments: Foot exam normal  Lymphadenopathy:     Head:     Right side of head: No submental or submandibular adenopathy.     Left side of head: No submental or submandibular adenopathy.     Cervical: No cervical adenopathy.  Skin:    General: Skin is warm and dry.      Coloration: Skin is not pale.     Findings: No rash.     Nails: There is no clubbing.  Neurological:     Mental Status: He is alert and oriented to person, place, and time.     Sensory: No sensory deficit.  Psychiatric:        Speech: Speech normal.        Behavior: Behavior normal.     No results found for any visits on 08/01/21.     The ASCVD Risk score (Arnett DK, et al., 2019) failed to calculate for the following reasons:   The 2019 ASCVD risk score is only valid for ages 47 to 29    Assessment & Plan:   Problem List Items Addressed This Visit   None Pneumonia vaccine was given this visit 38 minutes spent high degree of complexity significant hypertension and diabetes morbid obesity in a young patient.  I spent extra time with patient education multi medication management collaboration with clinical pharmacy referral back to clinical pharmacy for close term follow-up No follow-ups on file.    Asencion Noble, MD

## 2021-08-01 ENCOUNTER — Ambulatory Visit: Payer: Self-pay | Admitting: Critical Care Medicine

## 2021-08-06 ENCOUNTER — Other Ambulatory Visit: Payer: Self-pay

## 2021-08-07 ENCOUNTER — Other Ambulatory Visit: Payer: Self-pay

## 2021-08-13 ENCOUNTER — Other Ambulatory Visit: Payer: Self-pay

## 2021-08-13 NOTE — Progress Notes (Unsigned)
S:    Willie Spears is a 31 y.o. male who presents for diabetes evaluation, education, and management. PMH is significant for T2DM, HTN, HLD, CKD. Patient was referred and last seen by Dr. Delford Field on 06/19/21. He has since no showed visits with clinical pharmacist and Dr. Delford Field. At last visit, A1c was 11.5 and Trulicity was increased. BP was 167/112 and amlodipine was started and lisinopril switched to valsartan-HCTZ. Atorvastatin was started. Dr. Delford Field counseled extensively on lifestyle modifications.   Today, patient arrives in *** good spirits and presents without *** any assistance. *** -DM visit priority, recheck BP -trulicity or ozempic?   Family/Social History: ***  Current diabetes medications include: ***, metformin XR 1000 mg BID Current hypertension medications include: amlodipine 10 mg daily, valsartan-HCTZ 160-25 mg daily Current hyperlipidemia medications include: atorvastatin 20 mg daily  Patient reports taking all medications as prescribed. Patient {Actions; denies-reports:120008} adherence with medications. Patient reports missing {his/her/their:21314} medications *** times per week, on average.  Do you feel that your medications are working for you? {YES NO:22349} Have you been experiencing any side effects to the medications prescribed? {YES NO:22349} Do you have any problems obtaining medications due to transportation or finances? {YES [id number] Insurance coverage: ***  Patient {Actions; denies-reports:120008} hypoglycemic events.  Reported home fasting blood sugars: ***  Reported 2 hour post-meal/random blood sugars: ***.  Patient {Actions; denies-reports:120008} nocturia (nighttime urination).  Patient {Actions; denies-reports:120008} neuropathy (nerve pain). Patient {Actions; denies-reports:120008} visual changes. Patient {Actions; denies-reports:120008} self foot exams.   Patient reported dietary habits: Eats *** meals/day Breakfast: *** Lunch:  *** Dinner: *** Snacks: *** Drinks: ***  Within the past 12 months, did you worry whether your food would run out before you got money to buy more? {YES NO:22349} Within the past 12 months, did the food you bought run out, and you didn't have money to get more? {YES NO:22349} PHQ-9 Score: ***  Patient-reported exercise habits: ***   O:  7 day average blood glucose: ***  Lab Results  Component Value Date   HGBA1C 11.5 (A) 06/19/2021   There were no vitals filed for this visit.  Lipid Panel     Component Value Date/Time   CHOL 212 (H) 06/19/2021 1201   TRIG 170 (H) 06/19/2021 1201   HDL 59 06/19/2021 1201   CHOLHDL 3.6 06/19/2021 1201   LDLCALC 123 (H) 06/19/2021 1201    Clinical Atherosclerotic Cardiovascular Disease (ASCVD): No  The ASCVD Risk score (Arnett DK, et al., 2019) failed to calculate for the following reasons:   The 2019 ASCVD risk score is only valid for ages 70 to 73    A/P: Diabetes longstanding *** currently ***. Patient is *** able to verbalize appropriate hypoglycemia management plan. Medication adherence appears ***. Control is suboptimal due to ***. -*** -Patient educated on purpose, proper use, and potential adverse effects of ***.  -Extensively discussed pathophysiology of diabetes, recommended lifestyle interventions, dietary effects on blood sugar control.  -Counseled on s/sx of and management of hypoglycemia.  -Next A1c anticipated July 2023.   ASCVD risk - primary ***secondary prevention in patient with diabetes. Last LDL is *** not at goal of <54 *** mg/dL. ASCVD risk factors include *** and 10-year ASCVD risk score of ***. {Desc; low/moderate/high:110033} intensity statin indicated.  -{Meds adjust:18428} ***statin *** mg.   Hypertension longstanding *** currently ***. Blood pressure goal of <130/80 *** mmHg. Medication adherence ***. Blood pressure control is suboptimal due to ***. -***  Written patient instructions provided. Patient  verbalized understanding of treatment plan. Total time in face to face counseling *** minutes.    Follow up pharmacist *** PCP clinic visit in ***.

## 2021-08-19 ENCOUNTER — Ambulatory Visit: Payer: Self-pay | Admitting: Pharmacist

## 2021-08-19 ENCOUNTER — Other Ambulatory Visit: Payer: Self-pay

## 2021-08-22 ENCOUNTER — Other Ambulatory Visit: Payer: Self-pay

## 2021-08-23 ENCOUNTER — Other Ambulatory Visit: Payer: Self-pay

## 2021-10-07 ENCOUNTER — Ambulatory Visit: Payer: Self-pay | Admitting: Critical Care Medicine

## 2021-10-07 NOTE — Progress Notes (Deleted)
Established Patient Office Visit  Subjective   Patient ID: Willie Spears, male    DOB: Apr 14, 1990  Age: 31 y.o. MRN: [id number]  No chief complaint on file.   HPI 06/2021 PCP of [name] last seen 10/2020 This is a pleasant 31 year old male last seen in May 2022 face-to-face  Patient's had several telemedicine visits since that time On arrival today the blood sugar is elevated at 224 A1c is still 11.  He has been taking Trulicity 1.5 mg weekly and metformin 1000 mg twice daily.  He has not been compliant with exercise or diet.  He is due a pneumonia vaccine and is yet to receive it.  He does need a foot exam as well today.  He has no real specific complaints.  He tends to eat at fast food restaurants.  He no longer is smoking tobacco products.  He has been taking the lisinopril 10 mg daily but arrival blood pressure is 167/112.  Patient's had vitamin D deficiency and was on vitamin D supplementation now is taking a multivitamin.  8/7 Cardiovascular and Mediastinum  Hypertension associated with stage 2 chronic kidney disease due to type 2 diabetes mellitus ([place])   Hypertension not well controlled plan to discontinue lisinopril and begin valsartan HCT 160/25 daily and amlodipine 10 mg daily  We will reassess metabolic panel and renal function  I went over with the patient a lifestyle medicine handout including improvements to his diet and exercise program  I confirm the patient is no longer using tobacco products he does used to marijuana blunts daily asked him to minimize this      Relevant Medications  atorvastatin (LIPITOR) 20 MG tablet  Dulaglutide (TRULICITY) 3 LN/9.8XQ SOPN  metFORMIN (GLUCOPHAGE-XR) 500 MG 24 hr tablet  valsartan-hydrochlorothiazide (DIOVAN-HCT) 160-25 MG tablet  amLODipine (NORVASC) 10 MG tablet  Other Relevant Orders  Comprehensive metabolic panel  CBC with Differential/Platelet  Endocrine  Type 2 diabetes mellitus ([place]) - Primary (Chronic)   Significant  type 2 diabetes not any improvement in his current program plan is to increase Trulicity to 3 mg weekly continue metformin twice daily  I spent about 20 minutes going over and exercise and improved plant-based diet using the lifestyle medicine handout.  I emphasized several points in the handout asked him to take this to [facility] and joint of his local [employer].  He [place] to the [place].  He will be seen in short-term follow-up by clinical pharmacy and then Dr. [name] or myself in 6 weeks      Relevant Medications  atorvastatin (LIPITOR) 20 MG tablet  Dulaglutide (TRULICITY) 3 JJ/9.4RD SOPN  metFORMIN (GLUCOPHAGE-XR) 500 MG 24 hr tablet  valsartan-hydrochlorothiazide (DIOVAN-HCT) 160-25 MG tablet  TRUEplus Lancets 28G MISC  glucose blood (TRUE METRIX BLOOD GLUCOSE TEST) test strip  Blood Glucose Monitoring Suppl (TRUE METRIX METER) w/Device KIT  Other Relevant Orders  HgB A1c (Completed)  Glucose (CBG), Fasting (Completed)  Comprehensive metabolic panel  Lipid panel  Genitourinary  RESOLVED: Scrotal abscess   This has resolved this      Other  Vitamin D deficiency (Chronic)   Plan to have the patient switch to oral over-the-counter vitamin D at this time and follow a healthier diet      Microalbuminuria   Patient had elevated albumin in the past in the urine this will have to be followed      Obesity, Class III, BMI 40-49.9 (morbid obesity) ([place])   Patient has morbid obesity with significant  comorbidity of hypertension and diabetes  I tried to spend extra time this visit 20 minutes plus engaging him on lifestyle management      Relevant Medications  Dulaglutide (TRULICITY) 3 NW/2.9FA SOPN  metFORMIN (GLUCOPHAGE-XR) 500 MG 24 hr tablet  Former tobacco use   Not currently smoking at this time       Review of Systems  Constitutional:  Negative for chills, diaphoresis, fever, malaise/fatigue and weight loss.  HENT:  Negative for congestion, ear  discharge, ear pain, hearing loss, nosebleeds, sore throat and tinnitus.   Eyes:  Negative for blurred vision, double vision, photophobia and discharge.  Respiratory:  Negative for cough, hemoptysis, sputum production, shortness of breath, wheezing and stridor.        No excess mucus  Cardiovascular:  Negative for chest pain, palpitations, orthopnea, claudication, leg swelling and PND.  Gastrointestinal:  Negative for abdominal pain, blood in stool, constipation, diarrhea, heartburn, melena, nausea and vomiting.  Genitourinary:  Negative for dysuria, flank pain, frequency, hematuria and urgency.  Musculoskeletal:  Negative for back pain, falls, joint pain, myalgias and neck pain.  Skin:  Negative for itching and rash.  Neurological:  Negative for dizziness, tingling, tremors, sensory change, speech change, focal weakness, seizures, loss of consciousness, weakness and headaches.  Endo/Heme/Allergies:  Negative for environmental allergies and polydipsia. Does not bruise/bleed easily.  Psychiatric/Behavioral:  Negative for depression, hallucinations, memory loss, substance abuse and suicidal ideas. The patient is not nervous/anxious and does not have insomnia.   All other systems reviewed and are negative.     Objective:     There were no vitals taken for this visit.   Physical Exam Vitals reviewed.  Constitutional:      Appearance: Normal appearance. He is well-developed. He is obese. He is not diaphoretic.  HENT:     Head: Normocephalic and atraumatic.     Nose: Nose normal. No nasal deformity, septal deviation, mucosal edema or rhinorrhea.     Right Sinus: No maxillary sinus tenderness or frontal sinus tenderness.     Left Sinus: No maxillary sinus tenderness or frontal sinus tenderness.     Mouth/Throat:     Mouth: Mucous membranes are moist.     Pharynx: Oropharynx is clear. No oropharyngeal exudate.     Comments: Good dentition Eyes:     General: No scleral icterus.     Conjunctiva/sclera: Conjunctivae normal.     Pupils: Pupils are equal, round, and reactive to light.  Neck:     Thyroid: No thyromegaly.     Vascular: No carotid bruit or JVD.     Trachea: Trachea normal. No tracheal tenderness or tracheal deviation.  Cardiovascular:     Rate and Rhythm: Normal rate and regular rhythm.     Chest Wall: PMI is not displaced.     Pulses: Normal pulses. No decreased pulses.     Heart sounds: Normal heart sounds, S1 normal and S2 normal. Heart sounds not distant. No murmur heard.    No systolic murmur is present.     No diastolic murmur is present.     No friction rub. No gallop. No S3 or S4 sounds.  Pulmonary:     Effort: No tachypnea, accessory muscle usage or respiratory distress.     Breath sounds: No stridor. No decreased breath sounds, wheezing, rhonchi or rales.  Chest:     Chest wall: No tenderness.  Abdominal:     General: Bowel sounds are normal. There is no distension.  Palpations: Abdomen is soft. Abdomen is not rigid.     Tenderness: There is no abdominal tenderness. There is no guarding or rebound.  Musculoskeletal:        General: Normal range of motion.     Cervical back: Normal range of motion and neck supple. No edema, erythema or rigidity. No muscular tenderness. Normal range of motion.     Comments: Foot exam normal  Lymphadenopathy:     Head:     Right side of head: No submental or submandibular adenopathy.     Left side of head: No submental or submandibular adenopathy.     Cervical: No cervical adenopathy.  Skin:    General: Skin is warm and dry.     Coloration: Skin is not pale.     Findings: No rash.     Nails: There is no clubbing.  Neurological:     Mental Status: He is alert and oriented to person, place, and time.     Sensory: No sensory deficit.  Psychiatric:        Speech: Speech normal.        Behavior: Behavior normal.      No results found for any visits on 10/07/21.     The ASCVD Risk score (Arnett  DK, et al., 2019) failed to calculate for the following reasons:   The 2019 ASCVD risk score is only valid for ages 7 to 64    Assessment & Plan:   Problem List Items Addressed This Visit   None Pneumonia vaccine was given this visit 38 minutes spent high degree of complexity significant hypertension and diabetes morbid obesity in a young patient.  I spent extra time with patient education multi medication management collaboration with clinical pharmacy referral back to clinical pharmacy for close term follow-up No follow-ups on file.    Asencion Noble, MD

## 2021-10-17 ENCOUNTER — Ambulatory Visit: Payer: Self-pay | Admitting: Critical Care Medicine

## 2021-11-21 ENCOUNTER — Encounter: Payer: Self-pay | Admitting: Critical Care Medicine

## 2021-11-21 ENCOUNTER — Other Ambulatory Visit: Payer: Self-pay | Admitting: Critical Care Medicine

## 2021-11-21 ENCOUNTER — Ambulatory Visit: Payer: Self-pay | Attending: Critical Care Medicine | Admitting: Critical Care Medicine

## 2021-11-21 ENCOUNTER — Other Ambulatory Visit: Payer: Self-pay

## 2021-11-21 ENCOUNTER — Telehealth: Payer: Self-pay | Admitting: Critical Care Medicine

## 2021-11-21 VITALS — BP 157/103 | HR 89 | Ht 71.0 in | Wt 247.2 lb

## 2021-11-21 DIAGNOSIS — I129 Hypertensive chronic kidney disease with stage 1 through stage 4 chronic kidney disease, or unspecified chronic kidney disease: Secondary | ICD-10-CM

## 2021-11-21 DIAGNOSIS — E1121 Type 2 diabetes mellitus with diabetic nephropathy: Secondary | ICD-10-CM

## 2021-11-21 DIAGNOSIS — E1165 Type 2 diabetes mellitus with hyperglycemia: Secondary | ICD-10-CM

## 2021-11-21 DIAGNOSIS — E1122 Type 2 diabetes mellitus with diabetic chronic kidney disease: Secondary | ICD-10-CM

## 2021-11-21 DIAGNOSIS — N182 Chronic kidney disease, stage 2 (mild): Secondary | ICD-10-CM

## 2021-11-21 LAB — GLUCOSE, POCT (MANUAL RESULT ENTRY): POC Glucose: 310 mg/dl — AB (ref 70–99)

## 2021-11-21 LAB — POCT GLYCOSYLATED HEMOGLOBIN (HGB A1C): HbA1c, POC (controlled diabetic range): 10.9 % — AB (ref 0.0–7.0)

## 2021-11-21 MED ORDER — METFORMIN HCL ER 500 MG PO TB24
1000.0000 mg | ORAL_TABLET | Freq: Two times a day (BID) | ORAL | 6 refills | Status: DC
  Filled 2021-11-21: qty 120, 30d supply, fill #0
  Filled 2022-01-27 – 2022-07-07 (×5): qty 120, 30d supply, fill #1
  Filled 2022-09-08: qty 120, 30d supply, fill #2

## 2021-11-21 MED ORDER — BLOOD PRESSURE KIT DEVI
0 refills | Status: AC
  Filled 2021-11-21: qty 1, 1d supply, fill #0
  Filled 2022-01-27: qty 1, 30d supply, fill #0

## 2021-11-21 MED ORDER — OZEMPIC (2 MG/DOSE) 8 MG/3ML ~~LOC~~ SOPN
2.0000 mg | PEN_INJECTOR | SUBCUTANEOUS | 3 refills | Status: DC
  Filled 2021-11-21 – 2021-12-13 (×5): qty 3, 28d supply, fill #0
  Filled 2021-12-16: qty 12, 112d supply, fill #0

## 2021-11-21 MED ORDER — TETANUS-DIPHTH-ACELL PERTUSSIS 5-2-15.5 LF-MCG/0.5 IM SUSP: 0.5000 mL | Freq: Once | INTRAMUSCULAR | 0 refills | Status: AC

## 2021-11-21 MED ORDER — TETANUS-DIPHTH-ACELL PERTUSSIS 5-2-15.5 LF-MCG/0.5 IM SUSP
0.5000 mL | Freq: Once | INTRAMUSCULAR | 0 refills | Status: AC
  Filled 2021-11-21 – 2021-11-28 (×3): qty 0.5, 1d supply, fill #0

## 2021-11-21 MED ORDER — ATORVASTATIN CALCIUM 20 MG PO TABS
20.0000 mg | ORAL_TABLET | Freq: Every day | ORAL | 3 refills | Status: DC
Start: 2021-11-21 — End: 2022-07-27
  Filled 2021-11-21: qty 30, 30d supply, fill #0
  Filled 2022-01-27 – 2022-07-07 (×5): qty 30, 30d supply, fill #1

## 2021-11-21 MED ORDER — AMLODIPINE BESYLATE 10 MG PO TABS
10.0000 mg | ORAL_TABLET | Freq: Every day | ORAL | 2 refills | Status: DC
  Filled 2021-11-21: qty 30, 30d supply, fill #0
  Filled 2022-01-27 – 2022-06-05 (×3): qty 30, 30d supply, fill #1

## 2021-11-21 MED ORDER — VALSARTAN-HYDROCHLOROTHIAZIDE 320-25 MG PO TABS
1.0000 | ORAL_TABLET | Freq: Every day | ORAL | 3 refills | Status: DC
  Filled 2021-11-21: qty 90, 90d supply, fill #0
  Filled 2022-01-27: qty 30, 30d supply, fill #1
  Filled 2022-06-05 (×2): qty 90, 90d supply, fill #1

## 2021-11-21 NOTE — Patient Instructions (Addendum)
Stay on Ozempic and metformin  Focus on improved dietary controls as you discussed make sure you eat breakfast as well  Walk 30 minutes 5 times a week  No more sugar gum use sugar-free gum  Your A1c today was 10.9 this is only slightly down from 11.5 continue to really double down on the diet that we discussed  Increase valsartan HCT to 320/25 daily and stay on amlodipine daily  All medication refill sent to pharmacy  We sent Rx for Tetanus vaccine to pharmacy downstairs  Labs today include urine for microalbumin  Return to see Dr. Joya Gaskins 3 months and see Lurena Joiner our clinical pharmacist in 1 month for your diabetes and blood pressure

## 2021-11-21 NOTE — Assessment & Plan Note (Signed)
Type 2 diabetes not well controlled will continue with Ozempic and metformin and likely will need additional medication perhaps even insulin which she wishes to avoid  He will continue to follow the lifestyle medicine approach as we have discussed previously and increase his exercise and have this patient return to see me in 3 months and see clinical pharmacy in 1 month  The following Lifestyle Medicine recommendations according to [place] of Lifestyle Medicine [facility]) were discussed and offered to patient who agrees to start the journey:  A. Whole Foods, Plant-based plate comprising of fruits and vegetables, plant-based proteins, whole-grain carbohydrates was discussed in detail with the patient.   A list for source of those nutrients were also provided to the patient.  Patient will use only water or unsweetened tea for hydration. B.  The need to stay away from risky substances including alcohol, smoking; obtaining 7 to 9 hours of restorative sleep, at least 150 minutes of moderate intensity exercise weekly, the importance of healthy social connections,  and stress reduction techniques were discussed. C.  A full color page of  Calorie density of various food groups per pound showing examples of each food groups was provided to the patient.

## 2021-11-21 NOTE — Progress Notes (Signed)
Established Patient Office Visit  Subjective   Patient ID: Willie Spears, male    DOB: November 05, 1990  Age: 31 y.o. MRN: [id number]  Chief Complaint  Patient presents with   Diabetes   Medication Refill   Hypertension    HPI 06/2021 PCP of Willie Spears last seen 10/2020 This is a pleasant 31 year old male last seen in May 2022 face-to-face  Patient's had several telemedicine visits since that time On arrival today the blood sugar is elevated at 224 A1c is still 11.  He has been taking Trulicity 1.5 mg weekly and metformin 1000 mg twice daily.  He has not been compliant with exercise or diet.  He is due a pneumonia vaccine and is yet to receive it.  He does need a foot exam as well today.  He has no real specific complaints.  He tends to eat at fast food restaurants.  He no longer is smoking tobacco products.  He has been taking the lisinopril 10 mg daily but arrival blood pressure is 167/112.  Patient's had vitamin D deficiency and was on vitamin D supplementation now is taking a multivitamin.  9/21 This patient is seen in return follow-up now is established with me for primary care.  Patient has hypertension and type 2 diabetes.  On arrival blood pressure is elevated 157/103 and A1c is 10.9 down from 11-1/2 at the last visit.  Patient last seen in April of this year and was started on Ozempic he is up to 2 mg weekly maintains metformin as well.  His blood sugars at home range from 170-210.  He has changed some of his diet but is still eating a lot of hamburgers and skipping breakfast.  He also tends to like to chew sugar chewing gum.  He also drinks a lot of Welches grape juice.  He does not smoke cigarettes he takes to blunts of marijuana a day.  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  He is needing a tetanus shot agrees to receive.  Note patient has not been on blood pressure medicines in 2 days as he ran out The patient has no other complaints   Review of Systems  Constitutional:  Negative  for chills, diaphoresis, fever, malaise/fatigue and weight loss.  HENT:  Negative for congestion, ear discharge, ear pain, hearing loss, nosebleeds, sore throat and tinnitus.   Eyes:  Negative for blurred vision, double vision, photophobia and discharge.  Respiratory:  Negative for cough, hemoptysis, sputum production, shortness of breath, wheezing and stridor.        No excess mucus  Cardiovascular:  Negative for chest pain, palpitations, orthopnea, claudication, leg swelling and PND.  Gastrointestinal:  Negative for abdominal pain, blood in stool, constipation, diarrhea, heartburn, melena, nausea and vomiting.  Genitourinary:  Negative for dysuria, flank pain, frequency, hematuria and urgency.  Musculoskeletal:  Negative for back pain, falls, joint pain, myalgias and neck pain.  Skin:  Negative for itching and rash.  Neurological:  Negative for dizziness, tingling, tremors, sensory change, speech change, focal weakness, seizures, loss of consciousness, weakness and headaches.  Endo/Heme/Allergies:  Negative for environmental allergies and polydipsia. Does not bruise/bleed easily.  Psychiatric/Behavioral:  Negative for depression, hallucinations, memory loss, substance abuse and suicidal ideas. The patient is not nervous/anxious and does not have insomnia.   All other systems reviewed and are negative.     Objective:     BP (!) 157/103   Pulse 89   Ht 5\' 11"  (1.803 m)   Wt 247  lb 3.2 oz (112.1 kg)   SpO2 99%   BMI 34.48 kg/m    Physical Exam Vitals reviewed.  Constitutional:      Appearance: Normal appearance. He is well-developed. He is obese. He is not diaphoretic.  HENT:     Head: Normocephalic and atraumatic.     Nose: Nose normal. No nasal deformity, septal deviation, mucosal edema or rhinorrhea.     Right Sinus: No maxillary sinus tenderness or frontal sinus tenderness.     Left Sinus: No maxillary sinus tenderness or frontal sinus tenderness.     Mouth/Throat:      Mouth: Mucous membranes are moist.     Pharynx: Oropharynx is clear. No oropharyngeal exudate.     Comments: Good dentition Eyes:     General: No scleral icterus.    Conjunctiva/sclera: Conjunctivae normal.     Pupils: Pupils are equal, round, and reactive to light.  Neck:     Thyroid: No thyromegaly.     Vascular: No carotid bruit or JVD.     Trachea: Trachea normal. No tracheal tenderness or tracheal deviation.  Cardiovascular:     Rate and Rhythm: Normal rate and regular rhythm.     Chest Wall: PMI is not displaced.     Pulses: Normal pulses. No decreased pulses.     Heart sounds: Normal heart sounds, S1 normal and S2 normal. Heart sounds not distant. No murmur heard.    No systolic murmur is present.     No diastolic murmur is present.     No friction rub. No gallop. No S3 or S4 sounds.  Pulmonary:     Effort: No tachypnea, accessory muscle usage or respiratory distress.     Breath sounds: No stridor. No decreased breath sounds, wheezing, rhonchi or rales.  Chest:     Chest wall: No tenderness.  Abdominal:     General: Bowel sounds are normal. There is no distension.     Palpations: Abdomen is soft. Abdomen is not rigid.     Tenderness: There is no abdominal tenderness. There is no guarding or rebound.  Musculoskeletal:        General: Normal range of motion.     Cervical back: Normal range of motion and neck supple. No edema, erythema or rigidity. No muscular tenderness. Normal range of motion.     Comments: Foot exam normal  Lymphadenopathy:     Head:     Right side of head: No submental or submandibular adenopathy.     Left side of head: No submental or submandibular adenopathy.     Cervical: No cervical adenopathy.  Skin:    General: Skin is warm and dry.     Coloration: Skin is not pale.     Findings: No rash.     Nails: There is no clubbing.  Neurological:     Mental Status: He is alert and oriented to person, place, and time.     Sensory: No sensory deficit.   Psychiatric:        Speech: Speech normal.        Behavior: Behavior normal.      Results for orders placed or performed in visit on 11/21/21  POCT glucose (manual entry)  Result Value Ref Range   POC Glucose 310 (A) 70 - 99 mg/dl  POCT glycosylated hemoglobin (Hb A1C)  Result Value Ref Range   Hemoglobin A1C     HbA1c POC (<> result, manual entry)     HbA1c, POC (prediabetic range)  HbA1c, POC (controlled diabetic range) 10.9 (A) 0.0 - 7.0 %      The ASCVD Risk score (Arnett DK, et al., 2019) failed to calculate for the following reasons:   The 2019 ASCVD risk score is only valid for ages 7 to 52    Assessment & Plan:   Problem List Items Addressed This Visit       Cardiovascular and Mediastinum   Hypertension associated with stage 2 chronic kidney disease due to type 2 diabetes mellitus ([place])    Hypertension poorly controlled plan is to increase valsartan HCT to 320/25 and to continue amlodipine daily.  Patient reminded to not allow medications to run out as he has this time because he has been out of blood pressure medicines for approximately 2 days.  Patient encouraged to reduce the amount of processed foods in his diet further including bacon and sausage as he is eating  Return to see clinical pharmacy 1 month      Relevant Medications   amLODipine (NORVASC) 10 MG tablet   atorvastatin (LIPITOR) 20 MG tablet   metFORMIN (GLUCOPHAGE-XR) 500 MG 24 hr tablet   valsartan-hydrochlorothiazide (DIOVAN-HCT) 320-25 MG tablet     Endocrine   Type 2 diabetes mellitus ([facility]) - Primary (Chronic)    Type 2 diabetes not well controlled will continue with Ozempic and metformin and likely will need additional medication perhaps even insulin which she wishes to avoid  He will continue to follow the lifestyle medicine approach as we have discussed previously and increase his exercise and have this patient return to see me in 3 months and see clinical pharmacy in 1 month  The  following Lifestyle Medicine recommendations according to [place] of Lifestyle Medicine [facility]) were discussed and offered to patient who agrees to start the journey:  A. Whole Foods, Plant-based plate comprising of fruits and vegetables, plant-based proteins, whole-grain carbohydrates was discussed in detail with the patient.   A list for source of those nutrients were also provided to the patient.  Patient will use only water or unsweetened tea for hydration. B.  The need to stay away from risky substances including alcohol, smoking; obtaining 7 to 9 hours of restorative sleep, at least 150 minutes of moderate intensity exercise weekly, the importance of healthy social connections,  and stress reduction techniques were discussed. C.  A full color page of  Calorie density of various food groups per pound showing examples of each food groups was provided to the patient.       Relevant Medications   atorvastatin (LIPITOR) 20 MG tablet   metFORMIN (GLUCOPHAGE-XR) 500 MG 24 hr tablet   valsartan-hydrochlorothiazide (DIOVAN-HCT) 320-25 MG tablet   Other Relevant Orders   POCT glucose (manual entry) (Completed)   POCT glycosylated hemoglobin (Hb A1C) (Completed)   Urine microalbumin-creatinine with uACR     Other   Obesity, Class III, BMI 40-49.9 (morbid obesity) ([place])    The following Lifestyle Medicine recommendations according to [place] of Lifestyle Medicine [facility]) were discussed and offered to patient who agrees to start the journey:  A. Whole Foods, Plant-based plate comprising of fruits and vegetables, plant-based proteins, whole-grain carbohydrates was discussed in detail with the patient.   A list for source of those nutrients were also provided to the patient.  Patient will use only water or unsweetened tea for hydration. B.  The need to stay away from risky substances including alcohol, smoking; obtaining 7 to 9 hours of restorative sleep, at least 150 minutes  of moderate  intensity exercise weekly, the importance of healthy social connections,  and stress reduction techniques were discussed. C.  A full color page of  Calorie density of various food groups per pound showing examples of each food groups was provided to the patient.       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    Asencion Noble, MD

## 2021-11-21 NOTE — Assessment & Plan Note (Signed)
Hypertension poorly controlled plan is to increase valsartan HCT to 320/25 and to continue amlodipine daily.  Patient reminded to not allow medications to run out as he has this time because he has been out of blood pressure medicines for approximately 2 days.  Patient encouraged to reduce the amount of processed foods in his diet further including bacon and sausage as he is eating  Return to see clinical pharmacy 1 month

## 2021-11-21 NOTE — Assessment & Plan Note (Signed)

## 2021-11-21 NOTE — Telephone Encounter (Signed)
Let pt know I sent BP meter to Willie Spears

## 2021-11-22 ENCOUNTER — Other Ambulatory Visit (HOSPITAL_COMMUNITY): Payer: Self-pay

## 2021-11-22 ENCOUNTER — Other Ambulatory Visit: Payer: Self-pay

## 2021-11-22 LAB — MICROALBUMIN / CREATININE URINE RATIO
Creatinine, Urine: 151 mg/dL
Microalb/Creat Ratio: 2556 mg/g creat — ABNORMAL HIGH (ref 0–29)
Microalbumin, Urine: 3860.3 ug/mL

## 2021-11-22 NOTE — Telephone Encounter (Signed)
Called patient and he is aware of note  

## 2021-11-23 ENCOUNTER — Other Ambulatory Visit: Payer: Self-pay | Admitting: Critical Care Medicine

## 2021-11-23 DIAGNOSIS — R809 Proteinuria, unspecified: Secondary | ICD-10-CM

## 2021-11-23 NOTE — Progress Notes (Signed)
Let pt know protein very high in urine due to diabetes follow recommendatoins as given at office visit

## 2021-11-25 ENCOUNTER — Telehealth: Payer: Self-pay

## 2021-11-25 NOTE — Telephone Encounter (Signed)
Pt was called and vm was left, Information has been sent to nurse pool.   

## 2021-11-25 NOTE — Telephone Encounter (Signed)
-----   Message from Elsie Stain, MD sent at 11/23/2021  3:25 PM EDT ----- Let pt know protein very high in urine due to diabetes follow recommendatoins as given at office visit

## 2021-11-26 ENCOUNTER — Other Ambulatory Visit: Payer: Self-pay

## 2021-11-27 ENCOUNTER — Other Ambulatory Visit: Payer: Self-pay

## 2021-11-28 ENCOUNTER — Other Ambulatory Visit: Payer: Self-pay

## 2021-12-03 ENCOUNTER — Other Ambulatory Visit (HOSPITAL_COMMUNITY): Payer: Self-pay

## 2021-12-03 ENCOUNTER — Other Ambulatory Visit: Payer: Self-pay

## 2021-12-04 ENCOUNTER — Other Ambulatory Visit (HOSPITAL_COMMUNITY): Payer: Self-pay

## 2021-12-14 ENCOUNTER — Other Ambulatory Visit (HOSPITAL_COMMUNITY): Payer: Self-pay

## 2021-12-17 ENCOUNTER — Other Ambulatory Visit: Payer: Self-pay

## 2021-12-19 ENCOUNTER — Other Ambulatory Visit: Payer: Self-pay

## 2021-12-25 NOTE — Progress Notes (Deleted)
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  PMH is significant for HTN, T2DM, and obesity. Patient was referred and last seen by Primary Care Provider, Dr. Delford Field, on 11/21/21.   At last visit, his A1c was down to 10.9 from 11.5 previously.  Additionally, his BP was elevated at 157/103, however, he stated he had been out of his BP medications for 2 days prior. His combination Diovan-HCT was increased at that time.   Today, patient arrives in *** good spirits and presents without *** any assistance. ***  Patient reports Diabetes was diagnosed in ***.   Family/Social History:  -Fhx: DM -Social history: some marijuana use  Current diabetes medications include: Ozempic 2 mg once weekly, metformin 500mg  XR 2 tablets BID Current hypertension medications include: valsartan-HCTZ 320-25mg  once daily, amlodipine 10mg  once daily  Current hyperlipidemia medications include: atorvastatin 20mg  once daily  Patient reports adherence to taking all medications as prescribed.  *** Patient denies adherence with medications, reports missing *** medications *** times per week, on average.  Insurance coverage: none  Patient {Actions; denies-reports:120008} hypoglycemic events.  Reported home fasting blood sugars: ***  Reported 2 hour post-meal/random blood sugars: ***.  Patient {Actions; denies-reports:120008} nocturia (nighttime urination).  Patient {Actions; denies-reports:120008} neuropathy (nerve pain). Patient {Actions; denies-reports:120008} visual changes. Patient {Actions; denies-reports:120008} self foot exams.   Patient reported dietary habits: Eats *** meals/day Breakfast: *** Lunch: *** Dinner: *** Snacks: *** Drinks: ***  Within the past 12 months, did you worry whether your food would run out before you got money to buy more? {YES NO:22349} Within the past 12 months, did the food you bought run out,  and you didn't have money to get more? {YES NO:22349} PHQ-9 Score: ***  Patient-reported exercise habits: ***   O:   ROS  Physical Exam  7 day average blood glucose: ***  *** CGM Download:  % Time CGM is active: ***% Average Glucose: *** mg/dL Glucose Management Indicator: ***  Glucose Variability: *** (goal <36%) Time in Goal:  - Time in range 70-180: ***% - Time above range: ***% - Time below range: ***% Observed patterns:   Lab Results  Component Value Date   HGBA1C 10.9 (A) 11/21/2021   There were no vitals filed for this visit.  Lipid Panel     Component Value Date/Time   CHOL 212 (H) 06/19/2021 1201   TRIG 170 (H) 06/19/2021 1201   HDL 59 06/19/2021 1201   CHOLHDL 3.6 06/19/2021 1201   LDLCALC 123 (H) 06/19/2021 1201    Clinical Atherosclerotic Cardiovascular Disease (ASCVD): {YES/NO:21197} The ASCVD Risk score (Arnett DK, et al., 2019) failed to calculate for the following reasons:   The 2019 ASCVD risk score is only valid for ages 81 to 3   Patient is participating in a Managed Medicaid Plan:  {MM YES/NO:27447::"Yes"}   A/P: Diabetes longstanding *** currently ***. Patient is *** able to verbalize appropriate hypoglycemia management plan. Medication adherence appears ***. Control is suboptimal due to ***. -{Meds adjust:18428} basal insulin *** (insulin ***). Patient will continue to titrate 1 unit every *** days if fasting blood sugar > 100mg /dl until fasting blood sugars reach goal or next visit.  -{Meds adjust:18428} rapid insulin *** (insulin ***) to ***.  -{Meds adjust:18428} GLP-1 *** (generic ***) to ***.  -{Meds adjust:18428} SGLT2-I *** (generic ***) to ***. Counseled on sick day rules. -{Meds adjust:18428} metformin *** to ***.  -  Patient educated on purpose, proper use, and potential adverse effects of ***.  -Extensively discussed pathophysiology of diabetes, recommended lifestyle interventions, dietary effects on blood sugar control.   -Counseled on s/sx of and management of hypoglycemia.  -Next A1c anticipated ***.   ASCVD risk - primary ***secondary prevention in patient with diabetes. Last LDL is *** not at goal of <70 *** mg/dL. ASCVD risk factors include *** and 10-year ASCVD risk score of ***. {Desc; low/moderate/high:110033} intensity statin indicated.  -{Meds adjust:18428} ***statin *** mg.   Hypertension longstanding *** currently ***. Blood pressure goal of <130/80 *** mmHg. Medication adherence ***. Blood pressure control is suboptimal due to ***. -***  Written patient instructions provided. Patient verbalized understanding of treatment plan.  Total time in face to face counseling *** minutes.    Follow-up:  Pharmacist ***. PCP clinic visit in December  Maryan Puls, PharmD PGY-1 [facility] Pharmacy Resident

## 2021-12-26 ENCOUNTER — Ambulatory Visit: Payer: Self-pay | Admitting: Pharmacist

## 2022-01-01 ENCOUNTER — Ambulatory Visit
Admission: EM | Admit: 2022-01-01 | Discharge: 2022-01-01 | Disposition: A | Discharge status: Home / Self Care | Payer: Self-pay | Attending: Physician Assistant | Admitting: Physician Assistant

## 2022-01-01 DIAGNOSIS — J019 Acute sinusitis, unspecified: Secondary | ICD-10-CM

## 2022-01-01 MED ORDER — AMOXICILLIN 500 MG PO CAPS: 500.0000 mg | ORAL_CAPSULE | Freq: Three times a day (TID) | ORAL | 0 refills | Status: DC

## 2022-01-01 NOTE — ED Provider Notes (Signed)
[facility]    CSN: [id number] Arrival date & time: 01/01/22  1108      History   Chief Complaint Chief Complaint  Patient presents with   Cough    HPI Willie Spears is a 31 y.o. male.   Patient here today for evaluation of cough, congestion, mild sore throat that started about a week ago. He reports overall symptoms have improved but he has had change in color of his mucus and symptoms are worse at night. He denies any fever. He has not had any nausea, vomiting or diarrhea. He has tried OTC meds with some improvement. He reports negative at home covid test.   The history is provided by the patient.  Cough Associated symptoms: sore throat   Associated symptoms: no chills, no ear pain, no eye discharge, no fever and no shortness of breath     Past Medical History:  Diagnosis Date   AKI (acute kidney injury) ([place]) 06/18/2020   Diabetes mellitus without complication ([place])    Hypertension    Scrotal abscess 05/01/2018    Patient Active Problem List   Diagnosis Date Noted   Hypertension associated with stage 2 chronic kidney disease due to type 2 diabetes mellitus ([place]) 10/01/2020   Obesity, Class III, BMI 40-49.9 (morbid obesity) ([place]) 10/01/2020   Microalbuminuria 07/10/2020   Type 2 diabetes mellitus ([place]) 03/29/2015   Vitamin D deficiency 03/29/2015   Family history of diabetes mellitus 03/28/2015    Past Surgical History:  Procedure Laterality Date   IRRIGATION AND DEBRIDEMENT ABSCESS N/A 05/02/2018   Procedure: IRRIGATION AND DEBRIDEMENT ABSCESS scrotal;  Surgeon: [name], MD;  Location: [facility];  Service: Urology;  Laterality: N/A;       Home Medications    Prior to Admission medications   Medication Sig Start Date End Date Taking? Authorizing Provider  amoxicillin (AMOXIL) 500 MG capsule Take 1 capsule (500 mg total) by mouth 3 (three) times daily. 01/01/22  Yes Francene Finders, PA-C  amLODipine (NORVASC) 10 MG tablet Take 1  tablet (10 mg total) by mouth daily. 11/21/21   Elsie Stain, MD  atorvastatin (LIPITOR) 20 MG tablet Take 1 tablet (20 mg total) by mouth daily. 11/21/21   Elsie Stain, MD  Blood Glucose Monitoring Suppl (TRUE METRIX METER) w/Device KIT Use to check blood sugar twice daily. 06/19/21   Elsie Stain, MD  Blood Pressure Monitoring (BLOOD PRESSURE KIT) DEVI Use to measure blood pressure 11/21/21   Elsie Stain, MD  glucose blood (TRUE METRIX BLOOD GLUCOSE TEST) test strip Use as instructed to check blood sugar twice daily. 06/19/21   Elsie Stain, MD  metFORMIN (GLUCOPHAGE-XR) 500 MG 24 hr tablet Take 2 tablets (1,000 mg total) by mouth 2 (two) times daily with a meal. 11/21/21   Elsie Stain, MD  Semaglutide, 2 MG/DOSE, (OZEMPIC, 2 MG/DOSE,) 8 MG/3ML SOPN Inject 2 mg as directed once a week. 11/21/21   Elsie Stain, MD  TRUEplus Lancets 28G MISC USE TO CHECK BLOOD SUGAR TWICE DAILY. 06/19/21 06/19/22  Elsie Stain, MD  valsartan-hydrochlorothiazide (DIOVAN-HCT) 320-25 MG tablet Take 1 tablet by mouth daily. 11/21/21   Elsie Stain, MD  hydrochlorothiazide (HYDRODIURIL) 50 MG tablet Take 1 tablet (50 mg total) by mouth daily. Patient not taking: Reported on 03/18/2019 05/06/18 03/18/19  [name], MD  insulin aspart (NOVOLOG FLEXPEN) 100 UNIT/ML FlexPen Inject 5 Units into the skin 3 (three) times daily with meals for  30 days. Patient not taking: Reported on 03/18/2019 05/05/18 03/18/19  [name], MD  insulin regular (NOVOLIN R) 100 units/mL injection Inject 0.1 mLs (10 Units total) into the skin 3 (three) times daily before meals. 12/30/19 01/13/20  [name], MD    Family History Family History  Problem Relation Age of Onset   Diabetes Mother    Diabetes Father     Social History Social History   Tobacco Use   Smoking status: Former    Types: Cigarettes    Quit date: 06/13/2020    Years since quitting: 1.5   Smokeless tobacco: Never  Vaping  Use   Vaping Use: Never used  Substance Use Topics   Alcohol use: No   Drug use: Yes    Types: Marijuana     Allergies   Patient has no known allergies.   Review of Systems Review of Systems  Constitutional:  Negative for chills and fever.  HENT:  Positive for congestion and sore throat. Negative for ear pain.   Eyes:  Negative for discharge and redness.  Respiratory:  Positive for cough. Negative for shortness of breath.   Gastrointestinal:  Negative for abdominal pain, diarrhea, nausea and vomiting.     Physical Exam Triage Vital Signs ED Triage Vitals [01/01/22 1131]  Enc Vitals Group     BP (!) 139/93     Pulse Rate 99     Resp 18     Temp 98 F (36.7 C)     Temp Source Oral     SpO2 98 %     Weight      Height      Head Circumference      Peak Flow      Pain Score 0     Pain Loc      Pain Edu?      Excl. in [place]?    No data found.  Updated Vital Signs BP (!) 139/93 (BP Location: Left Arm)   Pulse 99   Temp 98 F (36.7 C) (Oral)   Resp 18   SpO2 98%   Physical Exam Vitals and nursing note reviewed.  Constitutional:      General: He is not in acute distress.    Appearance: Normal appearance. He is not ill-appearing.  HENT:     Head: Normocephalic and atraumatic.     Nose: Congestion present.     Mouth/Throat:     Mouth: Mucous membranes are moist.     Pharynx: Oropharynx is clear. No oropharyngeal exudate or posterior oropharyngeal erythema.  Eyes:     Conjunctiva/sclera: Conjunctivae normal.  Cardiovascular:     Rate and Rhythm: Normal rate and regular rhythm.     Heart sounds: Normal heart sounds. No murmur heard. Pulmonary:     Effort: Pulmonary effort is normal. No respiratory distress.     Breath sounds: Normal breath sounds. No wheezing, rhonchi or rales.  Skin:    General: Skin is warm and dry.  Neurological:     Mental Status: He is alert.  Psychiatric:        Mood and Affect: Mood normal.        Thought Content: Thought content  normal.      UC Treatments / Results  Labs (all labs ordered are listed, but only abnormal results are displayed) Labs Reviewed - No data to display  EKG   Radiology No results found.  Procedures Procedures (including critical care time)  Medications Ordered in UC Medications -  No data to display  Initial Impression / Assessment and Plan / UC Course  I have reviewed the triage vital signs and the nursing notes.  Pertinent labs & imaging results that were available during my care of the patient were reviewed by me and considered in my medical decision making (see chart for details).    Suspect mild sinus infection given duration of symptoms and mucus changes. Encouraged follow up with any further concerns.   Final Clinical Impressions(s) / UC Diagnoses   Final diagnoses:  Acute sinusitis, recurrence not specified, unspecified location   Discharge Instructions   None    ED Prescriptions     Medication Sig Dispense Auth. Provider   amoxicillin (AMOXIL) 500 MG capsule Take 1 capsule (500 mg total) by mouth 3 (three) times daily. 21 capsule Francene Finders, PA-C      PDMP not reviewed this encounter.   Francene Finders, PA-C 01/01/22 1153

## 2022-01-01 NOTE — ED Triage Notes (Signed)
Pt c/o "faintly" sore throat, nasal congestion, cough   Onset ~ last Monday

## 2022-01-28 ENCOUNTER — Other Ambulatory Visit: Payer: Self-pay

## 2022-01-29 ENCOUNTER — Other Ambulatory Visit: Payer: Self-pay

## 2022-02-03 ENCOUNTER — Other Ambulatory Visit: Payer: Self-pay

## 2022-02-20 ENCOUNTER — Ambulatory Visit: Payer: Self-pay | Admitting: Critical Care Medicine

## 2022-03-18 ENCOUNTER — Other Ambulatory Visit: Payer: Self-pay

## 2022-03-20 ENCOUNTER — Ambulatory Visit: Payer: Self-pay | Admitting: Physician Assistant

## 2022-04-16 ENCOUNTER — Emergency Department (HOSPITAL_COMMUNITY)
Admission: EM | Admit: 2022-04-16 | Discharge: 2022-04-17 | Disposition: A | Discharge status: Home / Self Care | Payer: No Typology Code available for payment source | Attending: Emergency Medicine | Admitting: Emergency Medicine

## 2022-04-16 ENCOUNTER — Other Ambulatory Visit: Payer: Self-pay

## 2022-04-16 ENCOUNTER — Encounter (HOSPITAL_COMMUNITY): Payer: Self-pay

## 2022-04-16 ENCOUNTER — Emergency Department (HOSPITAL_COMMUNITY): Payer: No Typology Code available for payment source

## 2022-04-16 DIAGNOSIS — Z79899 Other long term (current) drug therapy: Secondary | ICD-10-CM | POA: Diagnosis not present

## 2022-04-16 DIAGNOSIS — S3992XA Unspecified injury of lower back, initial encounter: Secondary | ICD-10-CM | POA: Diagnosis present

## 2022-04-16 DIAGNOSIS — S7001XA Contusion of right hip, initial encounter: Secondary | ICD-10-CM | POA: Insufficient documentation

## 2022-04-16 DIAGNOSIS — Z794 Long term (current) use of insulin: Secondary | ICD-10-CM | POA: Insufficient documentation

## 2022-04-16 DIAGNOSIS — S39012A Strain of muscle, fascia and tendon of lower back, initial encounter: Secondary | ICD-10-CM | POA: Insufficient documentation

## 2022-04-16 DIAGNOSIS — S40012A Contusion of left shoulder, initial encounter: Secondary | ICD-10-CM | POA: Insufficient documentation

## 2022-04-16 DIAGNOSIS — E119 Type 2 diabetes mellitus without complications: Secondary | ICD-10-CM | POA: Diagnosis not present

## 2022-04-16 DIAGNOSIS — I10 Essential (primary) hypertension: Secondary | ICD-10-CM | POA: Diagnosis not present

## 2022-04-16 DIAGNOSIS — Y92410 Unspecified street and highway as the place of occurrence of the external cause: Secondary | ICD-10-CM | POA: Insufficient documentation

## 2022-04-16 DIAGNOSIS — T148XXA Other injury of unspecified body region, initial encounter: Secondary | ICD-10-CM

## 2022-04-16 DIAGNOSIS — S20212A Contusion of left front wall of thorax, initial encounter: Secondary | ICD-10-CM | POA: Insufficient documentation

## 2022-04-16 DIAGNOSIS — Z7984 Long term (current) use of oral hypoglycemic drugs: Secondary | ICD-10-CM | POA: Insufficient documentation

## 2022-04-16 NOTE — ED Notes (Signed)
Patient transported to X-ray ambulatory.   Approval to place in waiting area post imaging.
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
## 2022-04-17 ENCOUNTER — Other Ambulatory Visit: Payer: Self-pay

## 2022-04-17 MED ORDER — ACETAMINOPHEN 500 MG PO TABS
1000.0000 mg | ORAL_TABLET | Freq: Once | ORAL | Status: AC
  Administered 2022-04-17: 1000 mg via ORAL
  Filled 2022-04-17: qty 2

## 2022-04-17 MED ORDER — METHOCARBAMOL 500 MG PO TABS
500.0000 mg | ORAL_TABLET | Freq: Two times a day (BID) | ORAL | 0 refills | Status: DC
  Filled 2022-04-17: qty 20, 10d supply, fill #0

## 2022-04-17 MED ORDER — METHOCARBAMOL 500 MG PO TABS: 500.0000 mg | ORAL_TABLET | Freq: Two times a day (BID) | ORAL | 0 refills | Status: AC

## 2022-04-17 NOTE — Discharge Instructions (Addendum)
You may use over-the-counter Motrin (Ibuprofen), Acetaminophen (Tylenol), topical muscle creams such as SalonPas, [employer], Bengay, etc. Please stretch, apply ice or heat (whichever helps), and have massage therapy for additional assistance.
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
## 2022-04-30 ENCOUNTER — Ambulatory Visit: Payer: Self-pay

## 2022-04-30 ENCOUNTER — Ambulatory Visit: Payer: Self-pay | Admitting: Physician Assistant

## 2022-05-05 ENCOUNTER — Ambulatory Visit (INDEPENDENT_AMBULATORY_CARE_PROVIDER_SITE_OTHER): Payer: Self-pay | Admitting: Primary Care

## 2022-06-05 ENCOUNTER — Other Ambulatory Visit: Payer: Self-pay

## 2022-06-05 ENCOUNTER — Other Ambulatory Visit: Payer: Self-pay | Admitting: Critical Care Medicine

## 2022-06-05 MED ORDER — OZEMPIC (2 MG/DOSE) 8 MG/3ML ~~LOC~~ SOPN
2.0000 mg | PEN_INJECTOR | SUBCUTANEOUS | 0 refills | Status: DC
  Filled 2022-06-05 – 2022-07-07 (×2): qty 3, 28d supply, fill #0

## 2022-06-05 NOTE — Telephone Encounter (Signed)
Requested medication (s) are due for refill today: yes  Requested medication (s) are on the active medication list: yes  Last refill:  11/21/21  Future visit scheduled: no  Notes to clinic:  Unable to refill per protocol due to failed labs, no updated A1c results.      Requested Prescriptions  Pending Prescriptions Disp Refills   Semaglutide, 2 MG/DOSE, (OZEMPIC, 2 MG/DOSE,) 8 MG/3ML SOPN 3 mL 3    Sig: Inject 2 mg as directed once a week.     Endocrinology:  Diabetes - GLP-1 Receptor Agonists - semaglutide Failed - 06/05/2022 11:12 AM      Failed - HBA1C in normal range and within 180 days    HbA1c, POC (controlled diabetic range)  Date Value Ref Range Status  11/21/2021 10.9 (A) 0.0 - 7.0 % Final         Failed - Cr in normal range and within 360 days    Creat  Date Value Ref Range Status  03/28/2015 1.10 0.60 - 1.35 mg/dL Final   Creatinine, Ser  Date Value Ref Range Status  06/19/2021 1.66 (H) 0.76 - 1.27 mg/dL Final         Failed - Valid encounter within last 6 months    Recent Outpatient Visits           6 months ago Type 2 diabetes mellitus with hyperglycemia, without long-term current use of insulin [facility])   [place] Elsie Stain, MD   11 months ago Type 2 diabetes mellitus with hyperglycemia, without long-term current use of insulin [facility])   [place] Elsie Stain, MD   1 year ago 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

## 2022-06-06 ENCOUNTER — Other Ambulatory Visit: Payer: Self-pay

## 2022-06-12 ENCOUNTER — Other Ambulatory Visit: Payer: Self-pay

## 2022-06-16 ENCOUNTER — Other Ambulatory Visit: Payer: Self-pay

## 2022-06-29 ENCOUNTER — Ambulatory Visit
Admission: EM | Admit: 2022-06-29 | Discharge: 2022-06-29 | Disposition: A | Discharge status: Home / Self Care | Payer: Self-pay | Attending: Internal Medicine | Admitting: Internal Medicine

## 2022-06-29 DIAGNOSIS — N481 Balanitis: Secondary | ICD-10-CM

## 2022-06-29 DIAGNOSIS — I160 Hypertensive urgency: Secondary | ICD-10-CM

## 2022-06-29 DIAGNOSIS — L739 Follicular disorder, unspecified: Secondary | ICD-10-CM

## 2022-06-29 MED ORDER — VALSARTAN-HYDROCHLOROTHIAZIDE 320-25 MG PO TABS: 1.0000 | ORAL_TABLET | Freq: Every day | ORAL | 3 refills | Status: DC

## 2022-06-29 MED ORDER — DOXYCYCLINE HYCLATE 100 MG PO CAPS: 100.0000 mg | ORAL_CAPSULE | Freq: Two times a day (BID) | ORAL | 0 refills | Status: AC

## 2022-06-29 MED ORDER — CLOTRIMAZOLE-BETAMETHASONE 1-0.05 % EX CREA: TOPICAL_CREAM | CUTANEOUS | 0 refills | Status: DC

## 2022-06-29 NOTE — Discharge Instructions (Addendum)
Please wash the penile area and pat it dry.  Apply the cream twice daily for the next 7 days Please take antibiotic with a full glass of water Please ensure that you complete your course of antibiotics Sitz bath's or warm compress over the buttocks area Please take your blood pressure medications as recommended. Please return to urgent care if symptoms worsen.

## 2022-06-29 NOTE — ED Triage Notes (Addendum)
Pt presents today with a boil he stated is in between his buttocks,pt stated the pain is around a 8. He also mention that he might have thrush.

## 2022-06-30 NOTE — ED Provider Notes (Signed)
[facility]    CSN: [id number] Arrival date & time: 06/29/22  1003      History   Chief Complaint Chief Complaint  Patient presents with   Cyst    HPI Willie Spears is a 32 y.o. male comes to urgent care with a 3 to 4-day history of painful swelling in the natal cleft.  Patient says symptoms started insidiously and has been persistent.  Pain is throbbing in nature, moderate severity and aggravated by palpation with no known relieving factors.  No discharge from the swelling site.  Patient denies any history of pilonidal cyst.  Patient is concerned that he may have oral thrush.  Patient has a history of hypertension.  He stopped taking his antihypertensive medications a while back.  He is currently supposed to be on amlodipine and valsartan-hydrochlorothiazide.  Patient stopped taking the medications because of erectile dysfunction likely secondary to amlodipine use.  Patient denies any headaches.  No chest pain or chest pressure.  No abdominal pain.  No dizziness, no speech difficulties..  Patient also complains of irritation around the head of the penis.  Patient is uncircumcised.  He has had similar episodes in the past but this is more pronounced or severe.  He denies any dysuria urgency or frequency.  No changes in sexual partners.  No history of STI.  HPI  Past Medical History:  Diagnosis Date   AKI (acute kidney injury) (HCC) 06/18/2020   Diabetes mellitus without complication (HCC)    Hypertension    Scrotal abscess 05/01/2018    Patient Active Problem List   Diagnosis Date Noted   Hypertension associated with stage 2 chronic kidney disease due to type 2 diabetes mellitus (HCC) 10/01/2020   Obesity, Class III, BMI 40-49.9 (morbid obesity) (HCC) 10/01/2020   Microalbuminuria 07/10/2020   Type 2 diabetes mellitus (HCC) 03/29/2015   Vitamin D deficiency 03/29/2015   Family history of diabetes mellitus 03/28/2015    Past Surgical History:  Procedure Laterality  Date   IRRIGATION AND DEBRIDEMENT ABSCESS N/A 05/02/2018   Procedure: IRRIGATION AND DEBRIDEMENT ABSCESS scrotal;  Surgeon: Rene Paci, MD;  Location: [facility];  Service: Urology;  Laterality: N/A;       Home Medications    Prior to Admission medications   Medication Sig Start Date End Date Taking? Authorizing Provider  clotrimazole-betamethasone (LOTRISONE) cream Apply to affected area 2 times daily prn 06/29/22  Yes Dominique Calvey, Britta Mccreedy, MD  doxycycline (VIBRAMYCIN) 100 MG capsule Take 1 capsule (100 mg total) by mouth 2 (two) times daily for 7 days. 06/29/22 07/06/22 Yes Patriciann Becht, Britta Mccreedy, MD  atorvastatin (LIPITOR) 20 MG tablet Take 1 tablet (20 mg total) by mouth daily. 11/21/21   Storm Frisk, MD  Blood Glucose Monitoring Suppl (TRUE METRIX METER) w/Device KIT Use to check blood sugar twice daily. 06/19/21   Storm Frisk, MD  Blood Pressure Monitoring (BLOOD PRESSURE KIT) DEVI Use to measure blood pressure 11/21/21   Storm Frisk, MD  glucose blood (TRUE METRIX BLOOD GLUCOSE TEST) test strip Use as instructed to check blood sugar twice daily. 06/19/21   Storm Frisk, MD  metFORMIN (GLUCOPHAGE-XR) 500 MG 24 hr tablet Take 2 tablets (1,000 mg total) by mouth 2 (two) times daily with a meal. 11/21/21   Storm Frisk, MD  methocarbamol (ROBAXIN) 500 MG tablet Take 1 tablet (500 mg total) by mouth 2 (two) times daily. 04/17/22   Cardama, Amadeo Garnet, MD  Semaglutide, 2 MG/DOSE, (OZEMPIC,  2 MG/DOSE,) 8 MG/3ML SOPN Inject 2 mg into the skin once a week. Please make PCP appointment. 06/05/22   Storm Frisk, MD  valsartan-hydrochlorothiazide (DIOVAN-HCT) 320-25 MG tablet Take 1 tablet by mouth daily. 06/29/22   Autumnrose Yore, Britta Mccreedy, MD  hydrochlorothiazide (HYDRODIURIL) 50 MG tablet Take 1 tablet (50 mg total) by mouth daily. Patient not taking: Reported on 03/18/2019 05/06/18 03/18/19  Burnadette Pop, MD  insulin aspart (NOVOLOG FLEXPEN) 100 UNIT/ML FlexPen Inject 5 Units  into the skin 3 (three) times daily with meals for 30 days. Patient not taking: Reported on 03/18/2019 05/05/18 03/18/19  Burnadette Pop, MD  insulin regular (NOVOLIN R) 100 units/mL injection Inject 0.1 mLs (10 Units total) into the skin 3 (three) times daily before meals. 12/30/19 01/13/20  Cain Saupe, MD    Family History Family History  Problem Relation Age of Onset   Diabetes Mother    Diabetes Father     Social History Social History   Tobacco Use   Smoking status: Former    Types: Cigarettes    Quit date: 06/13/2020    Years since quitting: 2.0   Smokeless tobacco: Never  Vaping Use   Vaping Use: Never used  Substance Use Topics   Alcohol use: No   Drug use: Yes    Types: Marijuana     Allergies   Patient has no known allergies.   Review of Systems Review of Systems As per HPI  Physical Exam Triage Vital Signs ED Triage Vitals [06/29/22 1125]  Enc Vitals Group     BP (!) 192/120     Pulse Rate 90     Resp 18     Temp 97.9 F (36.6 C)     Temp Source Oral     SpO2 98 %     Weight      Height      Head Circumference      Peak Flow      Pain Score 8     Pain Loc      Pain Edu?      Excl. in GC?    No data found.  Updated Vital Signs BP (!) 192/120   Pulse 90   Temp 97.9 F (36.6 C) (Oral)   Resp 18   SpO2 98%   Visual Acuity Right Eye Distance:   Left Eye Distance:   Bilateral Distance:    Right Eye Near:   Left Eye Near:    Bilateral Near:     Physical Exam Vitals and nursing note reviewed.  Constitutional:      General: He is not in acute distress.    Appearance: Normal appearance. He is not ill-appearing.  HENT:     Right Ear: Tympanic membrane normal.     Left Ear: Tympanic membrane normal.     Mouth/Throat:     Mouth: Mucous membranes are moist.     Comments: No signs of oral thrush. Cardiovascular:     Rate and Rhythm: Normal rate and regular rhythm.     Pulses: Normal pulses.     Heart sounds: Normal heart sounds.   Pulmonary:     Effort: Pulmonary effort is normal.     Breath sounds: Normal breath sounds.  Abdominal:     General: Bowel sounds are normal.     Palpations: Abdomen is soft.  Neurological:     General: No focal deficit present.     Mental Status: He is alert and oriented to person, place, and  time. Mental status is at baseline.     Cranial Nerves: No cranial nerve deficit.     Sensory: No sensory deficit.      UC Treatments / Results  Labs (all labs ordered are listed, but only abnormal results are displayed) Labs Reviewed - No data to display  EKG   Radiology No results found.  Procedures Procedures (including critical care time)  Medications Ordered in UC Medications - No data to display  Initial Impression / Assessment and Plan / UC Course  I have reviewed the triage vital signs and the nursing notes.  Pertinent labs & imaging results that were available during my care of the patient were reviewed by me and considered in my medical decision making (see chart for details).     1.  Balanitis as an adult: Lotrisone cream to be applied twice daily for 7 days Patient is advised to stop using it if the rash improves before the seventh day  2.  Folliculitis of the natal cleft: Doxycycline 100 mg twice daily for 7 days Patient is advised to do sitz bath's NSAIDs as needed for pain Return precautions given  3.  Hypertensive urgency: Patient is advised to decrease salt intake to less than 2 g in 24 hours Patient is advised to increase physical activity (moderate exercise) for at least 450 minutes a week Alcohol intake in moderation Patient is advised to be compliant with his medications.  Complications of hypertension was discussed. Return precautions given Patient is advised to take valsartan-hydrochlorothiazide Amlodipine has been discontinued because of the side effects Regular monitoring of blood pressure recommended Return to urgent care if symptoms  worsen Final Clinical Impressions(s) / UC Diagnoses   Final diagnoses:  Balanitis circinata  Folliculitis  Hypertensive urgency     Discharge Instructions      Please wash the penile area and pat it dry.  Apply the cream twice daily for the next 7 days Please take antibiotic with a full glass of water Please ensure that you complete your course of antibiotics Sitz bath's or warm compress over the buttocks area Please take your blood pressure medications as recommended. Please return to urgent care if symptoms worsen.    ED Prescriptions     Medication Sig Dispense Auth. Provider   clotrimazole-betamethasone (LOTRISONE) cream Apply to affected area 2 times daily prn 15 g Araceli Coufal, Britta Mccreedy, MD   doxycycline (VIBRAMYCIN) 100 MG capsule Take 1 capsule (100 mg total) by mouth 2 (two) times daily for 7 days. 14 capsule Kushi Kun, Britta Mccreedy, MD   valsartan-hydrochlorothiazide (DIOVAN-HCT) 320-25 MG tablet Take 1 tablet by mouth daily. 90 tablet Mylan Schwarz, Britta Mccreedy, MD      PDMP not reviewed this encounter.   Merrilee Jansky, MD 06/30/22 [phone]

## 2022-07-07 ENCOUNTER — Other Ambulatory Visit: Payer: Self-pay

## 2022-07-11 ENCOUNTER — Other Ambulatory Visit: Payer: Self-pay

## 2022-07-14 ENCOUNTER — Ambulatory Visit
Admission: EM | Admit: 2022-07-14 | Discharge: 2022-07-14 | Disposition: A | Discharge status: Home / Self Care | Payer: Self-pay | Attending: Family Medicine | Admitting: Family Medicine

## 2022-07-14 ENCOUNTER — Other Ambulatory Visit (HOSPITAL_COMMUNITY): Payer: Self-pay

## 2022-07-14 ENCOUNTER — Other Ambulatory Visit: Payer: Self-pay

## 2022-07-14 DIAGNOSIS — L0291 Cutaneous abscess, unspecified: Secondary | ICD-10-CM

## 2022-07-14 MED ORDER — AMOXICILLIN-POT CLAVULANATE 875-125 MG PO TABS
1.0000 | ORAL_TABLET | Freq: Two times a day (BID) | ORAL | 0 refills | Status: AC
  Filled 2022-07-14: qty 14, 7d supply, fill #0

## 2022-07-14 MED ORDER — TRAMADOL HCL 50 MG PO TABS
50.0000 mg | ORAL_TABLET | Freq: Four times a day (QID) | ORAL | 0 refills | Status: AC | PRN
  Filled 2022-07-14: qty 12, 3d supply, fill #0

## 2022-07-14 NOTE — Discharge Instructions (Signed)
Take amoxicillin-clavulanate 875 mg--1 tab twice daily with food for 7 days  Take tramadol 50 mg-- 1 tablet every 6 hours as needed for pain.  This medication can make you sleepy or dizzy  Warm sitz bath 2 times daily.  This can help circulation in the area  If area becomes more swollen or more painful or does not quit draining in the next 72 hours, then please go to the emergency room for further evaluation.

## 2022-07-14 NOTE — ED Provider Notes (Signed)
[facility]    CSN: [id number] Arrival date & time: 07/14/22  1250      History   Chief Complaint Chief Complaint  Patient presents with   Abscess    HPI Willie Spears is a 32 y.o. male.    Abscess  Here for pain in drainage around his upper gluteal crevice. He was seen here April 28 and prescribed doxycycline for cellulitis.  He states he took 6 days of it, but then the last days were spilled.  He did improve while on it and the swelling improved.  Then about 2 or 3 days later, about 5 days ago, the area started draining and it is hurting.  No fever or chills.  He also was treated for balanitis, and that is improved.  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 Authorizing Provider  amoxicillin-clavulanate (AUGMENTIN) 875-125 MG tablet Take 1 tablet by mouth 2 (two) times daily for 7 days. 07/14/22 07/21/22 Yes Chanice Brenton, Janace Aris, MD  traMADol (ULTRAM) 50 MG tablet Take 1 tablet (50 mg total) by mouth  every 6 (six) hours as needed (pain). 07/14/22  Yes Zenia Resides, MD  atorvastatin (LIPITOR) 20 MG tablet Take 1 tablet (20 mg total) by mouth daily. 11/21/21   Storm Frisk, MD  Blood Glucose Monitoring Suppl (TRUE METRIX METER) w/Device KIT Use to check blood sugar twice daily. 06/19/21   Storm Frisk, MD  Blood Pressure Monitoring (BLOOD PRESSURE KIT) DEVI Use to measure blood pressure 11/21/21   Storm Frisk, MD  clotrimazole-betamethasone (LOTRISONE) cream Apply to affected area 2 times daily prn 06/29/22   Lamptey, Britta Mccreedy, MD  glucose blood (TRUE METRIX BLOOD GLUCOSE TEST) test strip Use as instructed to check blood sugar twice daily. 06/19/21   Storm Frisk, MD  metFORMIN (GLUCOPHAGE-XR) 500 MG 24 hr tablet Take 2 tablets (1,000 mg total) by mouth 2 (two) times daily with a meal. 11/21/21   Storm Frisk, MD  methocarbamol (ROBAXIN) 500 MG tablet Take 1 tablet (500 mg total) by mouth 2 (two) times daily. 04/17/22   Cardama, Amadeo Garnet, MD  Semaglutide, 2 MG/DOSE, (OZEMPIC, 2 MG/DOSE,) 8 MG/3ML SOPN Inject 2 mg into the skin once a week. Please make PCP appointment. 06/05/22   Storm Frisk, MD  valsartan-hydrochlorothiazide (DIOVAN-HCT) 320-25 MG tablet Take 1 tablet by mouth daily. 06/29/22   Merrilee Jansky, MD  hydrochlorothiazide (HYDRODIURIL) 50 MG tablet Take 1 tablet (50 mg  total) by mouth daily. Patient not taking: Reported on 03/18/2019 05/06/18 03/18/19  Burnadette Pop, MD  insulin aspart (NOVOLOG FLEXPEN) 100 UNIT/ML FlexPen Inject 5 Units into the skin 3 (three) times daily with meals for 30 days. Patient not taking: Reported on 03/18/2019 05/05/18 03/18/19  Burnadette Pop, MD  insulin regular (NOVOLIN R) 100 units/mL injection Inject 0.1 mLs (10 Units total) into the skin 3 (three) times daily before meals. 12/30/19 01/13/20  Cain Saupe, MD    Family History Family History  Problem Relation Age of Onset   Diabetes Mother    Diabetes Father      Social History Social History   Tobacco Use   Smoking status: Former    Types: Cigarettes    Quit date: 06/13/2020    Years since quitting: 2.0   Smokeless tobacco: Never  Vaping Use   Vaping Use: Never used  Substance Use Topics   Alcohol use: No   Drug use: Yes    Types: Marijuana     Allergies   Patient has no known allergies.   Review of Systems Review of Systems   Physical Exam Triage Vital Signs ED Triage Vitals [07/14/22 1453]  Enc Vitals Group     BP (!) 154/98     Pulse Rate (!) 125     Resp 18     Temp 98.6 F (37 C)     Temp Source Oral     SpO2 96 %     Weight      Height      Head Circumference      Peak Flow      Pain Score 8     Pain Loc      Pain Edu?      Excl. in GC?    No data found.  Updated Vital Signs BP (!) 154/98 (BP Location: Left Arm)   Pulse (!) 125   Temp 98.6 F (37 C) (Oral)   Resp 18   SpO2 96%   Visual Acuity Right Eye Distance:   Left Eye Distance:   Bilateral Distance:    Right Eye Near:   Left Eye Near:    Bilateral Near:     Physical Exam Vitals reviewed.  Constitutional:      General: He is not in acute distress.    Appearance: He is not ill-appearing, toxic-appearing or diaphoretic.  HENT:     Mouth/Throat:     Mouth: Mucous membranes are moist.  Eyes:     Extraocular Movements: Extraocular movements intact.     Conjunctiva/sclera: Conjunctivae normal.     Pupils: Pupils are equal, round, and reactive to light.  Cardiovascular:     Rate and Rhythm: Regular rhythm.     Heart sounds: No murmur heard.    Comments: Initially heart rate was 125, but it did fall to 104 by me while he was in the exam room. Musculoskeletal:     Cervical back: Neck supple.     Comments: On the right aspect of the superior gluteal crevice there is an area of induration about 4 cm x 3 cm.  In the upper portion there are 4 pustular areas that are draining some.  I do not note any true area of fluctuance.  Skin:     Coloration: Skin is not pale.  Neurological:     General: No focal deficit present.     Mental Status: He is alert and oriented to person, place, and time.  Psychiatric:  Behavior: Behavior normal.      UC Treatments / Results  Labs (all labs ordered are listed, but only abnormal results are displayed) Labs Reviewed - No data to display  EKG   Radiology No results found.  Procedures Procedures (including critical care time)  Medications Ordered in UC Medications - No data to display  Initial Impression / Assessment and Plan / UC Course  I have reviewed the triage vital signs and the nursing notes.  Pertinent labs & imaging results that were available during my care of the patient were reviewed by me and considered in my medical decision making (see chart for details).        Augmentin is sent in to treat the infection.  Sitz bath recommended 2 times daily.  I suspect that the swelling improved because the area was actually consolidating.  He will continue the cream for the balanitis for another week and then stop it.  If he worsens in any way he is to proceed to the emergency room for further evaluation. Tramadol sent in for pain Final Clinical Impressions(s) / UC Diagnoses   Final diagnoses:  Abscess     Discharge Instructions      Take amoxicillin-clavulanate 875 mg--1 tab twice daily with food for 7 days  Take tramadol 50 mg-- 1 tablet every 6 hours as needed for pain.  This medication can make you sleepy or dizzy  Warm sitz bath 2 times daily.  This can help circulation in the area  If area becomes more swollen or more painful or does not quit draining in the next 72 hours, then please go to the emergency room for further evaluation.     ED Prescriptions     Medication Sig Dispense Auth. Provider   amoxicillin-clavulanate (AUGMENTIN) 875-125 MG tablet Take 1 tablet by mouth 2 (two) times daily for 7 days. 14 tablet Kaliya Shreiner, Janace Aris, MD    traMADol (ULTRAM) 50 MG tablet Take 1 tablet (50 mg total) by mouth every 6 (six) hours as needed (pain). 12 tablet Brittanny Levenhagen, Janace Aris, MD      I have reviewed the PDMP during this encounter.   Zenia Resides, MD 07/14/22 [phone]

## 2022-07-14 NOTE — ED Triage Notes (Signed)
Pt c/o draining abscess. 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

## 2022-07-27 ENCOUNTER — Ambulatory Visit
Admission: EM | Admit: 2022-07-27 | Discharge: 2022-07-27 | Disposition: A | Discharge status: Home / Self Care | Payer: Self-pay

## 2022-07-27 DIAGNOSIS — L089 Local infection of the skin and subcutaneous tissue, unspecified: Secondary | ICD-10-CM

## 2022-07-27 DIAGNOSIS — I10 Essential (primary) hypertension: Secondary | ICD-10-CM

## 2022-07-27 DIAGNOSIS — B958 Unspecified staphylococcus as the cause of diseases classified elsewhere: Secondary | ICD-10-CM

## 2022-07-27 DIAGNOSIS — L219 Seborrheic dermatitis, unspecified: Secondary | ICD-10-CM

## 2022-07-27 DIAGNOSIS — R220 Localized swelling, mass and lump, head: Secondary | ICD-10-CM

## 2022-07-27 MED ORDER — AMOXICILLIN-POT CLAVULANATE 875-125 MG PO TABS: 1.0000 | ORAL_TABLET | Freq: Two times a day (BID) | ORAL | 0 refills | Status: AC

## 2022-07-27 MED ORDER — KETOCONAZOLE 2 % EX SHAM
1.0000 | MEDICATED_SHAMPOO | CUTANEOUS | 0 refills | Status: DC
Start: 2022-07-28 — End: 2023-02-16

## 2022-07-27 NOTE — ED Triage Notes (Signed)
Left upper swelling "started after hair cut". "Trimmed mustache on upper lip at the time as well" (? Infected hair follicle). No fever. No injury.

## 2022-07-27 NOTE — Discharge Instructions (Signed)
Start taking the antibiotic Augmentin twice daily with food until gone.  Apply the ketoconazole shampoo to affected area of beard 2-3 x/ week. Lather in the shower, leave on for 5 minutes prior to rinsing off.  Please start your new BP medication. Monitor your BP at home with a goal of 120/80. Follow up with PCP if BP remains elevated.  If your lip swelling continues, please return to clinic for recheck. If your develop severe lip or facial swelling, head to the ER.

## 2022-07-27 NOTE — ED Provider Notes (Signed)
[facility]    CSN: [id number] Arrival date & time: 07/27/22  1354      History   Chief Complaint Chief Complaint  Patient presents with   Oral Swelling    HPI Willie Spears is a 32 y.o. male.   Pleasant 32 year old male presents today due to concerns of upper lip swelling on the left side only.  States it started 2 days ago after getting a haircut to his beard.  States in the past he frequently gets these "scabbing lesions", they will pick off and they seem to go away.  He states there is a scaling/scabby lesion in his beard right above the swollen lip.  He denies any pain or itching.  He denies any tooth infections or dental issues.  He denies a fever.  He has not tried any treatments for this. In April, it was recommended that pt stop his norvasc and switch to losartan/HCTZ, however pt has not yet done this and is still taking norvasc only. He is not on an ACE/ARB, no hx of angioedema. Denies swelling to other areas of face or lip.      Past Medical History:  Diagnosis Date   AKI (acute kidney injury) (HCC) 06/18/2020   Diabetes mellitus without complication (HCC)    Hypertension    Scrotal abscess 05/01/2018    Patient Active Problem List   Diagnosis Date Noted   Hypertension associated with stage 2 chronic kidney disease due to type 2 diabetes mellitus (HCC) 10/01/2020   Obesity, Class III, BMI 40-49.9 (morbid obesity) (HCC) 10/01/2020   Microalbuminuria 07/10/2020   Type 2 diabetes mellitus (HCC) 03/29/2015   Vitamin D deficiency 03/29/2015   Family history of diabetes mellitus 03/28/2015    Past Surgical History:  Procedure Laterality Date   IRRIGATION AND DEBRIDEMENT ABSCESS N/A 05/02/2018   Procedure: IRRIGATION AND DEBRIDEMENT ABSCESS scrotal;  Surgeon: Rene Paci, MD;  Location: [facility];  Service: Urology;  Laterality: N/A;       Home Medications    Prior to Admission medications   Medication Sig Start Date End Date Taking?  Authorizing Provider  amLODipine (NORVASC) 5 MG tablet Take 5 mg by mouth daily.   Yes [provider]  amoxicillin-clavulanate (AUGMENTIN) 875-125 MG tablet Take 1 tablet by mouth 2 (two) times daily with a meal for 7 days. 07/27/22 08/03/22 Yes Meyah Corle L, PA  ketoconazole (NIZORAL) 2 % shampoo Apply 1 Application topically 2 (two) times a week. 07/28/22  Yes Chivon Lepage L, PA  metFORMIN (GLUCOPHAGE-XR) 500 MG 24 hr tablet Take 2 tablets (1,000 mg total) by mouth 2 (two) times daily with a meal. 11/21/21  Yes Storm Frisk, MD  Blood Glucose Monitoring Suppl (TRUE METRIX METER) w/Device KIT Use to check blood sugar twice daily. 06/19/21   Storm Frisk, MD  Blood Pressure Monitoring (BLOOD PRESSURE KIT) DEVI Use to measure blood pressure 11/21/21   Storm Frisk, MD  clotrimazole-betamethasone (LOTRISONE) cream Apply to affected area 2 times daily prn 06/29/22   Lamptey, Britta Mccreedy, MD  glucose blood (TRUE METRIX BLOOD GLUCOSE TEST) test strip Use as instructed to check blood sugar twice daily. 06/19/21   Storm Frisk, MD  methocarbamol (ROBAXIN) 500 MG tablet Take 1 tablet (500 mg total) by mouth 2 (two) times daily. 04/17/22   Cardama, Amadeo Garnet, MD  Semaglutide, 2 MG/DOSE, (OZEMPIC, 2 MG/DOSE,) 8 MG/3ML SOPN Inject 2 mg into the skin once a week. Please make PCP appointment.  06/05/22   Storm Frisk, MD  traMADol (ULTRAM) 50 MG tablet Take 1 tablet (50 mg total) by mouth every 6 (six) hours as needed (pain). 07/14/22   Zenia Resides, MD  valsartan-hydrochlorothiazide (DIOVAN-HCT) 320-25 MG tablet Take 1 tablet by mouth daily. Patient taking differently: Take 1 tablet by mouth daily. Haven't started yet. 06/29/22   Lamptey, Britta Mccreedy, MD  hydrochlorothiazide (HYDRODIURIL) 50 MG tablet Take 1 tablet (50 mg total) by mouth daily. Patient not taking: Reported on 03/18/2019 05/06/18 03/18/19  Burnadette Pop, MD  insulin aspart (NOVOLOG FLEXPEN) 100 UNIT/ML FlexPen Inject 5  Units into the skin 3 (three) times daily with meals for 30 days. Patient not taking: Reported on 03/18/2019 05/05/18 03/18/19  Burnadette Pop, MD  insulin regular (NOVOLIN R) 100 units/mL injection Inject 0.1 mLs (10 Units total) into the skin 3 (three) times daily before meals. 12/30/19 01/13/20  Cain Saupe, MD    Family History Family History  Problem Relation Age of Onset   Diabetes Mother    Diabetes Father     Social History Social History   Tobacco Use   Smoking status: Former    Types: Cigarettes    Quit date: 06/13/2020    Years since quitting: 2.1   Smokeless tobacco: Never  Vaping Use   Vaping Use: Never used  Substance Use Topics   Alcohol use: No   Drug use: Yes    Types: Marijuana     Allergies   Patient has no known allergies.   Review of Systems Review of Systems As per HPI  Physical Exam Triage Vital Signs ED Triage Vitals  Enc Vitals Group     BP 07/27/22 1417 (!) 171/129     Pulse Rate 07/27/22 1417 85     Resp 07/27/22 1417 16     Temp 07/27/22 1417 98 F (36.7 C)     Temp Source 07/27/22 1417 Oral     SpO2 07/27/22 1417 100 %     Weight 07/27/22 1412 250 lb (113.4 kg)     Height 07/27/22 1412 5\' 11"  (1.803 m)     Head Circumference --      Peak Flow --      Pain Score 07/27/22 1412 0     Pain Loc --      Pain Edu? --      Excl. in GC? --    No data found.  Updated Vital Signs BP (!) 158/106 (BP Location: Left Arm)   Pulse 85   Temp 98 F (36.7 C) (Oral)   Resp 16   Ht 5\' 11"  (1.803 m)   Wt 250 lb (113.4 kg)   SpO2 100%   BMI 34.87 kg/m   Visual Acuity Right Eye Distance:   Left Eye Distance:   Bilateral Distance:    Right Eye Near:   Left Eye Near:    Bilateral Near:     Physical Exam Vitals and nursing note reviewed.  Constitutional:      General: He is not in acute distress.    Appearance: Normal appearance. He is well-developed. He is obese. He is not ill-appearing, toxic-appearing or diaphoretic.  HENT:      Head: Normocephalic and atraumatic.     Right Ear: External ear normal.     Left Ear: External ear normal.     Nose: Nose normal. No congestion or rhinorrhea.     Mouth/Throat:     Lips: Pink.     Mouth: Mucous membranes  are moist.     Dentition: Normal dentition. Does not have dentures. No dental tenderness, gingival swelling, dental caries, dental abscesses or gum lesions.     Tongue: No lesions. Tongue does not deviate from midline.     Palate: No lesions.     Pharynx: Oropharynx is clear. Uvula midline. No pharyngeal swelling, oropharyngeal exudate, posterior oropharyngeal erythema or uvula swelling.     Tonsils: No tonsillar exudate or tonsillar abscesses.      Comments: L upper lip edematous; no vesicles Eyes:     General: No scleral icterus.       Right eye: No discharge.        Left eye: No discharge.     Extraocular Movements: Extraocular movements intact.     Conjunctiva/sclera: Conjunctivae normal.     Pupils: Pupils are equal, round, and reactive to light.  Cardiovascular:     Rate and Rhythm: Normal rate and regular rhythm.     Heart sounds: No murmur heard. Pulmonary:     Effort: Pulmonary effort is normal. No respiratory distress.     Breath sounds: Normal breath sounds. No stridor. No wheezing, rhonchi or rales.  Musculoskeletal:        General: No swelling.     Cervical back: Normal range of motion and neck supple. No rigidity or tenderness.  Lymphadenopathy:     Cervical: No cervical adenopathy.  Skin:    General: Skin is warm and dry.     Capillary Refill: Capillary refill takes less than 2 seconds.     Findings: Rash (scaling rash to beard) present.  Neurological:     General: No focal deficit present.     Mental Status: He is alert and oriented to person, place, and time.  Psychiatric:        Mood and Affect: Mood normal.      UC Treatments / Results  Labs (all labs ordered are listed, but only abnormal results are displayed) Labs Reviewed - No  data to display  EKG   Radiology No results found.  Procedures Procedures (including critical care time)  Medications Ordered in UC Medications - No data to display  Initial Impression / Assessment and Plan / UC Course  I have reviewed the triage vital signs and the nursing notes.  Pertinent labs & imaging results that were available during my care of the patient were reviewed by me and considered in my medical decision making (see chart for details).     Swelling of upper lip -secondary to suspected infection.  Patient is not describing symptoms of angioedema.  Additionally he is not currently on an ACE and ARB.  The swelling appears to be at the area of the suspected staph infection.  Given his known history of diabetes, will avoid prednisone at this time. Staph skin infection -start Augmentin twice daily to cover for bacterial infection. Seborrheic dermatitis -numerous areas of beard seem to be affected by this.  Will have patient use ketoconazole shampoo to the beard 2-3 times a week. Essential hypertension -patient is still taking his Norvasc 5 mg.  He never bought nor started the new prescription which was recommended back in April.  I did encourage him to switch his medication and follow-up with his primary care physician to ensure more controlled blood pressure.   Final Clinical Impressions(s) / UC Diagnoses   Final diagnoses:  Swelling of upper lip  Staph skin infection  Seborrheic dermatitis  Essential hypertension     Discharge Instructions  Start taking the antibiotic Augmentin twice daily with food until gone.  Apply the ketoconazole shampoo to affected area of beard 2-3 x/ week. Lather in the shower, leave on for 5 minutes prior to rinsing off.  Please start your new BP medication. Monitor your BP at home with a goal of 120/80. Follow up with PCP if BP remains elevated.  If your lip swelling continues, please return to clinic for recheck. If your  develop severe lip or facial swelling, head to the ER.     ED Prescriptions     Medication Sig Dispense Auth. Provider   amoxicillin-clavulanate (AUGMENTIN) 875-125 MG tablet Take 1 tablet by mouth 2 (two) times daily with a meal for 7 days. 14 tablet Lucresia Simic L, PA   ketoconazole (NIZORAL) 2 % shampoo Apply 1 Application topically 2 (two) times a week. 120 mL Andy Moye L, PA      PDMP not reviewed this encounter.   Maretta Bees, [place] 07/27/22 1737

## 2022-08-11 ENCOUNTER — Other Ambulatory Visit: Payer: Self-pay

## 2022-09-08 ENCOUNTER — Other Ambulatory Visit: Payer: Self-pay | Admitting: Critical Care Medicine

## 2022-09-09 ENCOUNTER — Other Ambulatory Visit: Payer: Self-pay

## 2022-09-09 ENCOUNTER — Encounter: Payer: Self-pay | Admitting: Pharmacist

## 2022-09-15 ENCOUNTER — Other Ambulatory Visit: Payer: Self-pay

## 2022-09-19 ENCOUNTER — Other Ambulatory Visit: Payer: Self-pay | Admitting: Critical Care Medicine

## 2022-09-19 NOTE — Telephone Encounter (Signed)
Medication Refill - Medication: valsartan-hydrochlorothiazide (DIOVAN-HCT) 320-25 MG tablet [[id number]]   Has the patient contacted their pharmacy? Yes.     Preferred Pharmacy (with phone number or street name):  [place] [facility]  [address] [place] [address]  Phone: [phone] Fax: [phone]  Hours: M-F 7:30am-6pm      Has the patient been seen for an appointment in the last year OR does the patient have an upcoming appointment? Yes.    Agent: Please be advised that RX refills may take up to 3 business days. We ask that you follow-up with your pharmacy.

## 2022-09-22 NOTE — Telephone Encounter (Signed)
Requested medications are due for refill today.  no  Requested medications are on the active medications list.  yes  Last refill. 06/29/2022 #90 3 rf  Future visit scheduled.   Yes in January  Notes to clinic.  Labs are expired. Pt is over due for an OV. Pt requesting rx be sent to a different pharmacy.    Requested Prescriptions  Pending Prescriptions Disp Refills   valsartan-hydrochlorothiazide (DIOVAN-HCT) 320-25 MG tablet 90 tablet 3    Sig: Take 1 tablet by mouth daily.     Cardiovascular: ARB + Diuretic Combos Failed - 09/19/2022  5:08 PM      Failed - K in normal range and within 180 days    Potassium  Date Value Ref Range Status  06/19/2021 4.5 3.5 - 5.2 mmol/L Final         Failed - Na in normal range and within 180 days    Sodium  Date Value Ref Range Status  06/19/2021 138 134 - 144 mmol/L Final         Failed - Cr in normal range and within 180 days    Creat  Date Value Ref Range Status  03/28/2015 1.10 0.60 - 1.35 mg/dL Final   Creatinine, Ser  Date Value Ref Range Status  06/19/2021 1.66 (H) 0.76 - 1.27 mg/dL Final         Failed - eGFR is 10 or above and within 180 days    GFR, Est African American  Date Value Ref Range Status  03/28/2015 >89 >=60 mL/min Final   GFR calc Af Amer  Date Value Ref Range Status  12/30/2019 84 >59 mL/min/1.73 Final    Comment:    **In accordance with recommendations from the NKF-ASN Task force,**   [facility] is in the process of updating its eGFR calculation to the   2021 CKD-EPI creatinine equation that estimates kidney function   without a race variable.    GFR, Est Non African American  Date Value Ref Range Status  03/28/2015 >89 >=60 mL/min Final    Comment:      The estimated GFR is a calculation valid for adults (>=8 years old) that uses the CKD-EPI algorithm to adjust for age and sex. It is   not to be used for children, pregnant women, hospitalized patients,    patients on dialysis, or with rapidly  changing kidney function. According to the NKDEP, eGFR >89 is normal, 60-89 shows mild impairment, 30-59 shows moderate impairment, 15-29 shows severe impairment and <15 is ESRD.      GFR, Estimated  Date Value Ref Range Status  06/18/2020 58 (L) >60 mL/min Final    Comment:    (NOTE) Calculated using the CKD-EPI Creatinine Equation (2021)    eGFR  Date Value Ref Range Status  06/19/2021 57 (L) >59 mL/min/1.73 Final         Failed - Last BP in normal range    BP Readings from Last 1 Encounters:  07/27/22 (!) 158/106         Failed - Valid encounter within last 6 months    Recent Outpatient Visits           10 months ago Type 2 diabetes mellitus with hyperglycemia, without long-term current use of insulin [facility])   [facility] Storm Frisk, MD   1 year ago Type 2 diabetes mellitus with hyperglycemia, without long-term current use of insulin [facility])   [place] [facility] Storm Frisk, MD   1 year ago 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 [facility])   [place] [facility] Jonah Blue B, MD   2 years ago Type 2 diabetes mellitus with hyperglycemia, without long-term current use of insulin [facility])   [place] [facility] [place], [place] L, RPH-CPP   2 years ago Type 2 diabetes mellitus with hyperglycemia, without long-term current use of insulin [facility])   [place] [facility] Drucilla Chalet, RPH-CPP       Future Appointments             In 5 months [place] Charlcie Cradle, MD [facility]            Passed - Patient is not pregnant

## 2022-09-23 ENCOUNTER — Other Ambulatory Visit: Payer: Self-pay

## 2022-09-23 ENCOUNTER — Other Ambulatory Visit (HOSPITAL_COMMUNITY): Payer: Self-pay

## 2022-09-23 MED ORDER — VALSARTAN-HYDROCHLOROTHIAZIDE 320-25 MG PO TABS
1.0000 | ORAL_TABLET | Freq: Every day | ORAL | 3 refills | Status: DC
  Filled 2022-09-23 – 2022-09-29 (×2): qty 90, 90d supply, fill #0
  Filled 2023-01-07 (×2): qty 90, 90d supply, fill #1

## 2022-09-29 ENCOUNTER — Other Ambulatory Visit (HOSPITAL_COMMUNITY): Payer: Self-pay

## 2022-09-29 ENCOUNTER — Other Ambulatory Visit: Payer: Self-pay

## 2022-10-09 ENCOUNTER — Other Ambulatory Visit: Payer: Self-pay

## 2022-10-09 MED ORDER — AMLODIPINE BESYLATE 5 MG PO TABS
5.0000 mg | ORAL_TABLET | Freq: Every day | ORAL | 2 refills | Status: DC
  Filled 2022-10-09: qty 90, 90d supply, fill #0

## 2022-10-15 ENCOUNTER — Other Ambulatory Visit: Payer: Self-pay

## 2022-12-29 ENCOUNTER — Encounter: Payer: Self-pay | Admitting: Critical Care Medicine

## 2023-01-07 ENCOUNTER — Other Ambulatory Visit: Payer: Self-pay | Admitting: Critical Care Medicine

## 2023-01-07 ENCOUNTER — Other Ambulatory Visit: Payer: Self-pay

## 2023-01-07 DIAGNOSIS — E1121 Type 2 diabetes mellitus with diabetic nephropathy: Secondary | ICD-10-CM

## 2023-01-08 ENCOUNTER — Telehealth: Payer: Self-pay

## 2023-01-08 NOTE — Telephone Encounter (Signed)
-----   Message from Roney Jaffe sent at 01/07/2023  1:13 PM EST ----- Regarding: med refills Patient requested medication refills, unfortunately he has not been seen recently, and does not have an appointment to see new provider until beginning of the year.  Would you please call patient and let him know that is why I had to refuse his prescription refills and encouraged him to present to the mobile unit at his earliest convenience to be seen for those refills.  Thank you

## 2023-01-08 NOTE — Telephone Encounter (Signed)
Pt was called and no vm was left due to mailbox not being set up.Patient needs appointment for medication refills

## 2023-01-12 ENCOUNTER — Other Ambulatory Visit: Payer: Self-pay

## 2023-01-15 ENCOUNTER — Other Ambulatory Visit: Payer: Self-pay

## 2023-01-15 ENCOUNTER — Other Ambulatory Visit: Payer: Self-pay | Admitting: Critical Care Medicine

## 2023-01-15 DIAGNOSIS — E1121 Type 2 diabetes mellitus with diabetic nephropathy: Secondary | ICD-10-CM

## 2023-01-15 NOTE — Telephone Encounter (Signed)
Medication Refill -  Most Recent Primary Care Visit:  Provider: Shan Levans E  Department: [facility]  Visit Type: OFFICE VISIT  Date: 11/21/2021  Medication: metFORMIN (GLUCOPHAGE-XR) 500 MG 24 hr tablet Semaglutide, 2 MG/DOSE, (OZEMPIC, 2 MG/DOSE,) 8 MG/3ML  valsartan-hydrochlorothiazide (DIOVAN-HCT) 320-25 MG tablet   Has the patient contacted their pharmacy? Yes Need to contact physician  Is this the correct pharmacy for this prescription? Yes If no, delete pharmacy and type the correct one.  This is the patient's preferred pharmacy: [place] [facility] [address] [place] [address] Phone: [phone] Fax: [phone]  Has the prescription been filled recently? Yes  Is the patient out of the medication? Yes  Has the patient been seen for an appointment in the last year OR does the patient have an upcoming appointment? Yes  Can we respond through MyChart? Yes  Agent: Please be advised that Rx refills may take up to 3 business days. We ask that you follow-up with your pharmacy.  Patient needs his refills until his next appt March 10, 2023

## 2023-01-15 NOTE — Telephone Encounter (Signed)
Requested medication (s) are due for refill today: Yes except Valsartan  Requested medication (s) are on the active medication list: yes    Last refill: Metformin  11/22/22  #120  6 refills       Ozempic  06/05/22 3ml  0 refills        Valsartan 09/23/22 #90  3 refills  Future visit scheduled yes 03/10/23  Notes to clinic: Failed due to labs. Multiple no shows. Please review if appropriate for refills. Thank you  Requested Prescriptions  Pending Prescriptions Disp Refills   metFORMIN (GLUCOPHAGE-XR) 500 MG 24 hr tablet 120 tablet 6    Sig: Take 2 tablets (1,000 mg total) by mouth 2 (two) times daily with a meal.     Endocrinology:  Diabetes - Biguanides Failed - 01/15/2023 12:18 PM      Failed - Cr in normal range and within 360 days    Creat  Date Value Ref Range Status  03/28/2015 1.10 0.60 - 1.35 mg/dL Final   Creatinine, Ser  Date Value Ref Range Status  06/19/2021 1.66 (H) 0.76 - 1.27 mg/dL Final         Failed - HBA1C is between 0 and 7.9 and within 180 days    HbA1c, POC (controlled diabetic range)  Date Value Ref Range Status  11/21/2021 10.9 (A) 0.0 - 7.0 % Final         Failed - eGFR in normal range and within 360 days    GFR, Est African American  Date Value Ref Range Status  03/28/2015 >89 >=60 mL/min Final   GFR calc Af Amer  Date Value Ref Range Status  12/30/2019 84 >59 mL/min/1.73 Final    Comment:    **In accordance with recommendations from the NKF-ASN Task force,**   [facility] is in the process of updating its eGFR calculation to the   2021 CKD-EPI creatinine equation that estimates kidney function   without a race variable.    GFR, Est Non African American  Date Value Ref Range Status  03/28/2015 >89 >=60 mL/min Final    Comment:      The estimated GFR is a calculation valid for adults (>=59 years old) that uses the CKD-EPI algorithm to adjust for age and sex. It is   not to be used for children, pregnant women, hospitalized patients,    patients on  dialysis, or with rapidly changing kidney function. According to the NKDEP, eGFR >89 is normal, 60-89 shows mild impairment, 30-59 shows moderate impairment, 15-29 shows severe impairment and <15 is ESRD.      GFR, Estimated  Date Value Ref Range Status  06/18/2020 58 (L) >60 mL/min Final    Comment:    (NOTE) Calculated using the CKD-EPI Creatinine Equation (2021)    eGFR  Date Value Ref Range Status  06/19/2021 57 (L) >59 mL/min/1.73 Final         Failed - B12 Level in normal range and within 720 days    No results found for: "VITAMINB12"       Failed - Valid encounter within last 6 months    Recent Outpatient Visits           1 year ago Type 2 diabetes mellitus with hyperglycemia, without long-term current use of insulin (HCC)   [place] [facility] Dept Of [place]. [facility] Storm Frisk, MD   1 year ago Type 2 diabetes mellitus with hyperglycemia, without long-term current use of insulin (  HCC)   [place] [facility] [place] - A Dept Of [place]. 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 [facility] Jonah Blue B, MD   2 years ago Type 2 diabetes mellitus with hyperglycemia, without long-term current use of insulin (HCC)   [place] [facility] - A Dept Of [place]. [facility] Lois Huxley, [employer] L, RPH-CPP   3 years ago Type 2 diabetes mellitus with hyperglycemia, without long-term current use of insulin (HCC)   [place] [facility] - A Dept Of [place]. [facility] Lois Huxley, Cornelius Moras, RPH-CPP       Future Appointments             In 1 month Hoy Register, MD [facility] [place] - A Dept Of [place]. [facility]            Failed - CBC within normal limits and completed in the last 12 months    WBC   Date Value Ref Range Status  06/19/2021 8.1 3.4 - 10.8 x10E3/uL Final  06/18/2020 7.5 4.0 - 10.5 K/uL Final   RBC  Date Value Ref Range Status  06/19/2021 5.98 (H) 4.14 - 5.80 x10E6/uL Final  06/18/2020 5.88 (H) 4.22 - 5.81 MIL/uL Final   Hemoglobin  Date Value Ref Range Status  06/19/2021 14.5 13.0 - 17.7 g/dL Final   Hematocrit  Date Value Ref Range Status  06/19/2021 45.8 37.5 - 51.0 % Final   MCHC  Date Value Ref Range Status  06/19/2021 31.7 31.5 - 35.7 g/dL Final  40/98/1191 47.8 30.0 - 36.0 g/dL Final   [facility]  Date Value Ref Range Status  06/19/2021 24.2 (L) 26.6 - 33.0 pg Final  06/18/2020 23.8 (L) 26.0 - 34.0 pg Final   MCV  Date Value Ref Range Status  06/19/2021 77 (L) 79 - 97 fL Final   No results found for: "PLTCOUNTKUC", "LABPLAT", "POCPLA" RDW  Date Value Ref Range Status  06/19/2021 13.1 11.6 - 15.4 % Final          Semaglutide, 2 MG/DOSE, (OZEMPIC, 2 MG/DOSE,) 8 MG/3ML SOPN 3 mL 0    Sig: Inject 2 mg into the skin once a week. Please make PCP appointment.     Endocrinology:  Diabetes - GLP-1 Receptor Agonists - semaglutide Failed - 01/15/2023 12:18 PM      Failed - HBA1C in normal range and within 180 days    HbA1c, POC (controlled diabetic range)  Date Value Ref Range Status  11/21/2021 10.9 (A) 0.0 - 7.0 % Final         Failed - Cr in normal range and within 360 days    Creat  Date Value Ref Range Status  03/28/2015 1.10 0.60 - 1.35 mg/dL Final   Creatinine, Ser  Date Value Ref Range Status  06/19/2021 1.66 (H) 0.76 - 1.27 mg/dL Final         Failed - Valid encounter within last 6 months    Recent Outpatient Visits           1 year ago Type 2 diabetes mellitus with hyperglycemia, without long-term current use of insulin (HCC)   [place] [facility] - A Dept Of . [facility] Storm Frisk, MD   1 year  ago Type 2 diabetes mellitus with hyperglycemia, without long-term current use of insulin (HCC)    [place] [facility] [place] - A Dept Of [place]. 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 [facility] Jonah Blue B, MD   2 years ago Type 2 diabetes mellitus with hyperglycemia, without long-term current use of insulin (HCC)   [place] [facility] - A Dept Of [place]. [facility] Lois Huxley, [place] L, RPH-CPP   3 years ago Type 2 diabetes mellitus with hyperglycemia, without long-term current use of insulin (HCC)    [facility] - A Dept Of [place]. [facility] Lois Huxley, Cornelius Moras, RPH-CPP       Future Appointments             In 1 month Hoy Register, MD [facility] [place] - A Dept Of [place]. [facility]             valsartan-hydrochlorothiazide (DIOVAN-HCT) 320-25 MG tablet 90 tablet 3    Sig: Take 1 tablet by mouth daily.     Cardiovascular: ARB + Diuretic Combos Failed - 01/15/2023 12:18 PM      Failed - K in normal range and within 180 days    Potassium  Date Value Ref Range Status  06/19/2021 4.5 3.5 - 5.2 mmol/L Final         Failed - Na in normal range and within 180 days    Sodium  Date Value Ref Range Status  06/19/2021 138 134 - 144 mmol/L Final         Failed - Cr in normal range and within 180 days    Creat  Date Value Ref Range Status  03/28/2015 1.10 0.60 - 1.35 mg/dL Final   Creatinine, Ser  Date Value Ref Range Status  06/19/2021 1.66 (H) 0.76 - 1.27 mg/dL Final         Failed - eGFR is 10 or above and within 180 days    GFR, Est African American  Date Value Ref Range Status  03/28/2015 >89 >=60 mL/min Final   GFR calc Af Amer  Date Value Ref Range Status  12/30/2019 84 >59 mL/min/1.73 Final    Comment:    **In accordance with recommendations from the NKF-ASN Task force,**   [facility] is in the  process of updating its eGFR calculation to the   2021 CKD-EPI creatinine equation that estimates kidney function   without a race variable.    GFR, Est Non African American  Date Value Ref Range Status  03/28/2015 >89 >=60 mL/min Final    Comment:      The estimated GFR is a calculation valid for adults (>=71 years old) that uses the CKD-EPI algorithm to adjust for age and sex. It is   not to be used for children, pregnant women, hospitalized patients,    patients on dialysis, or with rapidly changing kidney function. According to the NKDEP, eGFR >89 is normal, 60-89 shows mild impairment, 30-59 shows moderate impairment, 15-29 shows severe impairment and <15 is ESRD.      GFR, Estimated  Date Value Ref Range Status  06/18/2020 58 (L) >60 mL/min Final    Comment:    (NOTE) Calculated using the CKD-EPI Creatinine Equation (2021)    eGFR  Date Value Ref Range Status  06/19/2021 57 (L) >59 mL/min/1.73 Final         Failed - Last BP in normal range    BP Readings from Last 1 Encounters:  07/27/22 (!) 158/106         Failed - Valid encounter within last 6 months    Recent Outpatient Visits           1 year ago Type 2 diabetes mellitus with hyperglycemia, without long-term current use of insulin (HCC)   [place] [facility] [place] - A Dept Of [place]. [facility] Storm Frisk, MD   1 year ago Type 2 diabetes mellitus with hyperglycemia, without long-term current use of insulin [facility])   [place] [facility] - A Dept Of [place]. 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 [facility] Jonah Blue B, MD   2 years ago Type 2 diabetes mellitus with hyperglycemia, without long-term current use of insulin (HCC)   [place] [facility] - A Dept Of [place]. [facility] Lois Huxley, [place] L, RPH-CPP   3 years ago Type 2 diabetes mellitus with hyperglycemia, without long-term current use of insulin (HCC)   [place] [facility] - A Dept Of [place]. [facility] Lois Huxley, Cornelius Moras, RPH-CPP       Future Appointments             In 1 month Hoy Register, MD [facility] [place] - A Dept Of [place]. [facility]            Passed - Patient is not pregnant

## 2023-01-16 ENCOUNTER — Other Ambulatory Visit: Payer: Self-pay

## 2023-01-16 ENCOUNTER — Telehealth: Payer: Medicaid Other | Admitting: Family Medicine

## 2023-01-16 DIAGNOSIS — N481 Balanitis: Secondary | ICD-10-CM

## 2023-01-16 DIAGNOSIS — L739 Follicular disorder, unspecified: Secondary | ICD-10-CM | POA: Diagnosis not present

## 2023-01-16 MED ORDER — DOXYCYCLINE HYCLATE 100 MG PO TABS
100.0000 mg | ORAL_TABLET | Freq: Two times a day (BID) | ORAL | 0 refills | Status: AC
  Filled 2023-01-16: qty 20, 10d supply, fill #0

## 2023-01-16 MED ORDER — CLOTRIMAZOLE-BETAMETHASONE 1-0.05 % EX CREA
1.0000 | TOPICAL_CREAM | Freq: Every day | CUTANEOUS | 0 refills | Status: AC
  Filled 2023-01-16: qty 15, 15d supply, fill #0

## 2023-01-16 NOTE — Progress Notes (Signed)
Virtual Visit Consent   Willie Spears, you are scheduled for a virtual visit with a [facility] provider today. Just as with appointments in the office, your consent must be obtained to participate. Your consent will be active for this visit and any virtual visit you may have with one of our providers in the next 365 days. If you have a MyChart account, a copy of this consent can be sent to you electronically.  As this is a virtual visit, video technology does not allow for your provider to perform a traditional examination. This may limit your provider's ability to fully assess your condition. If your provider identifies any concerns that need to be evaluated in person or the need to arrange testing (such as labs, EKG, etc.), we will make arrangements to do so. Although advances in technology are sophisticated, we cannot ensure that it will always work on either your end or our end. If the connection with a video visit is poor, the visit may have to be switched to a telephone visit. With either a video or telephone visit, we are not always able to ensure that we have a secure connection.  By engaging in this virtual visit, you consent to the provision of healthcare and authorize for your insurance to be billed (if applicable) for the services provided during this visit. Depending on your insurance coverage, you may receive a charge related to this service.  I need to obtain your verbal consent now. Are you willing to proceed with your visit today? Willie Spears has provided verbal consent on 01/16/2023 for a virtual visit (video or telephone). Georgana Curio, FNP  Date: 01/16/2023 10:23 AM  Virtual Visit via Video Note   I, Georgana Curio, connected with  Willie Spears  ([id number], January 20, 1991) on 01/16/23 at 10:15 AM EST by a video-enabled telemedicine application and verified that I am speaking with the correct person using two identifiers.  Location: Patient: Home Provider: Virtual Visit Location  Provider: Home Office   I discussed the limitations of evaluation and management by telemedicine and the availability of in person appointments. The patient expressed understanding and agreed to proceed.    History of Present Illness: Willie Spears is a 32 y.o. who identifies as a male who was assigned male at birth, and is being seen today for Irritation from masturbation as it was when seen by D.r Lamptey. 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 It is red and swollen. He says the doxycycline and lotrisone given previously resolved the problem. Marland Kitchen  HPI: HPI  Problems:  Patient Active Problem List   Diagnosis Date Noted   Hypertension associated with stage 2 chronic kidney disease due to type 2 diabetes mellitus (HCC) 10/01/2020   Obesity, Class III, BMI 40-49.9 (morbid obesity) (HCC) 10/01/2020   Microalbuminuria 07/10/2020   Type 2 diabetes mellitus (HCC) 03/29/2015   Vitamin D deficiency 03/29/2015   Family history of diabetes mellitus 03/28/2015    Allergies: No Known Allergies Medications:  Current Outpatient Medications:    clotrimazole-betamethasone (LOTRISONE) cream, Apply 1 Application topically daily for 7 days., Disp: 7 g, Rfl: 0   doxycycline (VIBRA-TABS) 100 MG tablet, Take 1 tablet (100 mg total) by mouth 2 (two) times daily for 10 days., Disp: 20 tablet, Rfl: 0   amLODipine (NORVASC) 5 MG tablet, Take 1 tablet (5 mg total) by mouth daily., Disp: 90 tablet, Rfl: 2   Blood Glucose Monitoring Suppl (TRUE METRIX METER)  w/Device KIT, Use to check blood sugar twice daily., Disp: 1 kit, Rfl: 0   Blood Pressure Monitoring (BLOOD PRESSURE KIT) DEVI, Use to measure blood pressure, Disp: 1 each, Rfl: 0   clotrimazole-betamethasone (LOTRISONE) cream, Apply to affected area 2 times daily prn, Disp: 15 g, Rfl: 0   glucose blood (TRUE METRIX BLOOD GLUCOSE TEST) test strip, Use as instructed to check blood sugar twice daily., Disp: 100 each, Rfl: 2    ketoconazole (NIZORAL) 2 % shampoo, Apply 1 Application topically 2 (two) times a week., Disp: 120 mL, Rfl: 0   metFORMIN (GLUCOPHAGE-XR) 500 MG 24 hr tablet, Take 2 tablets (1,000 mg total) by mouth 2 (two) times daily with a meal., Disp: 120 tablet, Rfl: 6   methocarbamol (ROBAXIN) 500 MG tablet, Take 1 tablet (500 mg total) by mouth 2 (two) times daily., Disp: 20 tablet, Rfl: 0   Semaglutide, 2 MG/DOSE, (OZEMPIC, 2 MG/DOSE,) 8 MG/3ML SOPN, Inject 2 mg into the skin once a week. Please make PCP appointment., Disp: 3 mL, Rfl: 0   traMADol (ULTRAM) 50 MG tablet, Take 1 tablet (50 mg total) by mouth every 6 (six) hours as needed (pain)., Disp: 12 tablet, Rfl: 0   valsartan-hydrochlorothiazide (DIOVAN-HCT) 320-25 MG tablet, Take 1 tablet by mouth daily., Disp: 90 tablet, Rfl: 3  Observations/Objective: Patient is well-developed, well-nourished in no acute distress.  Resting comfortably  at home.  Head is normocephalic, atraumatic.  No labored breathing.  Speech is clear and coherent with logical content.  Patient is alert and oriented at baseline.    Assessment and Plan: 1. Balanitis  2. Folliculitis  Keep area clean and dry. UC if sx persist or worsen.  Follow Up Instructions: I discussed the assessment and treatment plan with the patient. The patient was provided an opportunity to ask questions and all were answered. The patient agreed with the plan and demonstrated an understanding of the instructions.  A copy of instructions were sent to the patient via MyChart unless otherwise noted below.     The patient was advised to call back or seek an in-person evaluation if the symptoms worsen or if the condition fails to improve as anticipated.    Georgana Curio, FNP

## 2023-01-16 NOTE — Patient Instructions (Signed)
Balanitis  Balanitis is swelling and irritation of the head of the penis (glans penis). Balanitis occurs most often among males who have not had their foreskin removed (uncircumcised). In uncircumcised males, the condition may also cause inflammation of the skin around the foreskin. Balanitis sometimes causes scarring of the penis or foreskin, which can require surgery. This condition may develop because of an infection or another medical condition. Untreated balanitis can increase the risk of penile cancer. What are the causes? Common causes of this condition include: Irritation and lack of airflow due to fluid (smegma) that can build up on the glans penis. Poor personal hygiene, especially in uncircumcised males. Not cleaning the glans penis and foreskin well can result in a buildup of bacteria, viruses, and yeast, which can lead to infection and inflammation. Other causes include: Chemical irritation from products such as soaps or shower gels, especially those that have fragrance. Chemical irritation can also be caused by condoms, personal lubricants, petroleum jelly, spermicides, fabric softeners, or laundry detergents. Skin conditions, such as eczema, dermatitis, and psoriasis. Allergies to medicines, such as tetracycline and sulfa drugs. What increases the risk? The following factors may make you more likely to develop this condition: Being an uncircumcised male. Having diabetes. Having other medical conditions, including liver cirrhosis, congestive heart failure, or kidney disease. Having infections, such as candidiasis, HPV (human papillomavirus), herpes simplex, gonorrhea, or syphilis. Having a tight foreskin that is difficult to pull back (retract) past the glans penis. Being severely obese. History of reactive arthritis. What are the signs or symptoms? Symptoms of this condition include: Discharge from under the foreskin, and pain or difficulty retracting the foreskin. A bad smell  or itchiness on the penis. Tenderness, redness, and swelling of the glans penis. A rash or sores on the glans penis or foreskin. Inability to get an erection due to pain. Trouble urinating. Scarring of the penis or foreskin, in some cases. How is this diagnosed? This condition may be diagnosed based on a physical exam and tests of a swab of discharge to check for bacterial or fungal infection. You may also have blood tests to check for: Viruses that can cause balanitis. A high blood sugar (glucose) level. This could be a sign of diabetes, which can increase the risk of balanitis. How is this treated? Treatment for this condition depends on the cause. Treatment may include: Improving personal hygiene. Your health care provider may recommend sitting in a bath of warm water that is deep enough to cover your hips and buttocks (sitz bath). Medicines such as: Creams or ointments to reduce swelling (steroids) or to treat an infection. Antibiotic medicine. Antifungal medicine. Having surgery to remove or cut the foreskin (circumcision). This may be done if you have scarring on the foreskin that makes it difficult to retract. Controlling other medical problems that may be causing your condition or making it worse. Follow these instructions at home: Medicines Take over-the-counter and prescription medicines only as told by your health care provider. If you were prescribed an antibiotic medicine, use it as told by your health care provider. Do not stop using the antibiotic even if you start to feel better. General instructions Do not have sex until the condition clears up, or until your health care provider approves. Keep your penis clean and dry. Take sitz baths as recommended by your health care provider. Avoid products that irritate your skin or make symptoms worse, such as soaps and shower gels that have fragrance. Keep all follow-up visits. This is  important. Contact a health care provider  if: Your symptoms get worse or do not improve with home care. You develop chills or a fever. You have trouble urinating. You cannot retract your foreskin. Get help right away if: You develop severe pain. You are unable to urinate. Summary Balanitis is swelling and irritation of the head of the penis (glans penis). This condition is most common among uncircumcised males. Balanitis causes pain, redness, and swelling of the glans penis. Good personal hygiene is important. Treatment may include improving personal hygiene and applying creams or ointments. Contact a health care provider if your symptoms get worse or do not improve with home care. This information is not intended to replace advice given to you by your health care provider. Make sure you discuss any questions you have with your health care provider. Document Revised: 08/01/2020 Document Reviewed: 08/01/2020 Elsevier Patient Education  2024 [employer].

## 2023-02-06 ENCOUNTER — Encounter: Payer: Self-pay | Admitting: Critical Care Medicine

## 2023-02-16 ENCOUNTER — Telehealth: Payer: Medicaid Other | Admitting: Physician Assistant

## 2023-02-16 DIAGNOSIS — N481 Balanitis: Secondary | ICD-10-CM

## 2023-02-16 MED ORDER — CLOTRIMAZOLE-BETAMETHASONE 1-0.05 % EX CREA: 1.0000 | TOPICAL_CREAM | Freq: Every day | CUTANEOUS | 0 refills | Status: AC

## 2023-02-16 MED ORDER — FLUCONAZOLE 150 MG PO TABS: 150.0000 mg | ORAL_TABLET | Freq: Once | ORAL | 0 refills | Status: AC

## 2023-02-16 NOTE — Patient Instructions (Signed)
Willie Spears, thank you for joining Willie Loveless, PA-C for today's virtual visit.  While this provider is not your primary care provider (PCP), if your PCP is located in our provider database this encounter information will be shared with them immediately following your visit.   A [facility] MyChart account gives you access to today's visit and all your visits, tests, and labs performed at [facility] " click here if you don't have a [facility] MyChart account or go to [email or web address]  Consent: (Patient) Willie Spears provided verbal consent for this virtual visit at the beginning of the encounter.  Current Medications:  Current Outpatient Medications:    clotrimazole-betamethasone (LOTRISONE) cream, Apply 1 Application topically daily., Disp: 30 g, Rfl: 0   fluconazole (DIFLUCAN) 150 MG tablet, Take 1 tablet (150 mg total) by mouth once for 1 dose., Disp: 1 tablet, Rfl: 0   amLODipine (NORVASC) 5 MG tablet, Take 1 tablet (5 mg total) by mouth daily., Disp: 90 tablet, Rfl: 2   Blood Glucose Monitoring Suppl (TRUE METRIX METER) w/Device KIT, Use to check blood sugar twice daily., Disp: 1 kit, Rfl: 0   Blood Pressure Monitoring (BLOOD PRESSURE KIT) DEVI, Use to measure blood pressure, Disp: 1 each, Rfl: 0   glucose blood (TRUE METRIX BLOOD GLUCOSE TEST) test strip, Use as instructed to check blood sugar twice daily., Disp: 100 each, Rfl: 2   metFORMIN (GLUCOPHAGE-XR) 500 MG 24 hr tablet, Take 2 tablets (1,000 mg total) by mouth 2 (two) times daily with a meal., Disp: 120 tablet, Rfl: 6   methocarbamol (ROBAXIN) 500 MG tablet, Take 1 tablet (500 mg total) by mouth 2 (two) times daily., Disp: 20 tablet, Rfl: 0   Semaglutide, 2 MG/DOSE, (OZEMPIC, 2 MG/DOSE,) 8 MG/3ML SOPN, Inject 2 mg into the skin once a week. Please make PCP appointment., Disp: 3 mL, Rfl: 0   traMADol (ULTRAM) 50 MG tablet, Take 1 tablet (50 mg total) by mouth every 6 (six) hours as needed (pain).,  Disp: 12 tablet, Rfl: 0   valsartan-hydrochlorothiazide (DIOVAN-HCT) 320-25 MG tablet, Take 1 tablet by mouth daily., Disp: 90 tablet, Rfl: 3   Medications ordered in this encounter:  Meds ordered this encounter  Medications   clotrimazole-betamethasone (LOTRISONE) cream    Sig: Apply 1 Application topically daily.    Dispense:  30 g    Refill:  0    Supervising Provider:   Merrilee Jansky [[id number]]   fluconazole (DIFLUCAN) 150 MG tablet    Sig: Take 1 tablet (150 mg total) by mouth once for 1 dose.    Dispense:  1 tablet    Refill:  0    Supervising Provider:   Merrilee Jansky [[id number]]     *If you need refills on other medications prior to your next appointment, please contact your pharmacy*  Follow-Up: Call back or seek an in-person evaluation if the symptoms worsen or if the condition fails to improve as anticipated.  [facility] [phone]  Other Instructions Balanitis  Balanitis is swelling and irritation of the head of the penis (glans penis). Balanitis occurs most often among males who have not had their foreskin removed (uncircumcised). In uncircumcised males, the condition may also cause inflammation of the skin around the foreskin. Balanitis sometimes causes scarring of the penis or foreskin, which can require surgery. This condition may develop because of an infection or another medical condition. Untreated balanitis can increase the risk of penile cancer. What are  the causes? Common causes of this condition include: Irritation and lack of airflow due to fluid (smegma) that can build up on the glans penis. Poor personal hygiene, especially in uncircumcised males. Not cleaning the glans penis and foreskin well can result in a buildup of bacteria, viruses, and yeast, which can lead to infection and inflammation. Other causes include: Chemical irritation from products such as soaps or shower gels, especially those that have fragrance. Chemical  irritation can also be caused by condoms, personal lubricants, petroleum jelly, spermicides, fabric softeners, or laundry detergents. Skin conditions, such as eczema, dermatitis, and psoriasis. Allergies to medicines, such as tetracycline and sulfa drugs. What increases the risk? The following factors may make you more likely to develop this condition: Being an uncircumcised male. Having diabetes. Having other medical conditions, including liver cirrhosis, congestive heart failure, or kidney disease. Having infections, such as candidiasis, HPV (human papillomavirus), herpes simplex, gonorrhea, or syphilis. Having a tight foreskin that is difficult to pull back (retract) past the glans penis. Being severely obese. History of reactive arthritis. What are the signs or symptoms? Symptoms of this condition include: Discharge from under the foreskin, and pain or difficulty retracting the foreskin. A bad smell or itchiness on the penis. Tenderness, redness, and swelling of the glans penis. A rash or sores on the glans penis or foreskin. Inability to get an erection due to pain. Trouble urinating. Scarring of the penis or foreskin, in some cases. How is this diagnosed? This condition may be diagnosed based on a physical exam and tests of a swab of discharge to check for bacterial or fungal infection. You may also have blood tests to check for: Viruses that can cause balanitis. A high blood sugar (glucose) level. This could be a sign of diabetes, which can increase the risk of balanitis. How is this treated? Treatment for this condition depends on the cause. Treatment may include: Improving personal hygiene. Your health care provider may recommend sitting in a bath of warm water that is deep enough to cover your hips and buttocks (sitz bath). Medicines such as: Creams or ointments to reduce swelling (steroids) or to treat an infection. Antibiotic medicine. Antifungal medicine. Having surgery  to remove or cut the foreskin (circumcision). This may be done if you have scarring on the foreskin that makes it difficult to retract. Controlling other medical problems that may be causing your condition or making it worse. Follow these instructions at home: Medicines Take over-the-counter and prescription medicines only as told by your health care provider. If you were prescribed an antibiotic medicine, use it as told by your health care provider. Do not stop using the antibiotic even if you start to feel better. General instructions Do not have sex until the condition clears up, or until your health care provider approves. Keep your penis clean and dry. Take sitz baths as recommended by your health care provider. Avoid products that irritate your skin or make symptoms worse, such as soaps and shower gels that have fragrance. Keep all follow-up visits. This is important. Contact a health care provider if: Your symptoms get worse or do not improve with home care. You develop chills or a fever. You have trouble urinating. You cannot retract your foreskin. Get help right away if: You develop severe pain. You are unable to urinate. Summary Balanitis is swelling and irritation of the head of the penis (glans penis). This condition is most common among uncircumcised males. Balanitis causes pain, redness, and swelling of the glans penis.  Good personal hygiene is important. Treatment may include improving personal hygiene and applying creams or ointments. Contact a health care provider if your symptoms get worse or do not improve with home care. This information is not intended to replace advice given to you by your health care provider. Make sure you discuss any questions you have with your health care provider. Document Revised: 08/01/2020 Document Reviewed: 08/01/2020 Elsevier Patient Education  2024 Elsevier Inc.    If you have been instructed to have an in-person evaluation today at a  local Urgent Care facility, please use the link below. It will take you to a list of all of our available [facility], including address, phone number and hours of operation. Please do not delay care.  [facility]  If you or a family member do not have a primary care provider, use the link below to schedule a visit and establish care. When you choose a [facility] primary care physician or advanced practice provider, you gain a long-term partner in health. Find a Primary Care Provider  Learn more about [facility]'s in-office and virtual care options:  - Get Care Now

## 2023-02-16 NOTE — Progress Notes (Signed)
Virtual Visit Consent   Willie Spears, you are scheduled for a virtual visit with a [facility] provider today. Just as with appointments in the office, your consent must be obtained to participate. Your consent will be active for this visit and any virtual visit you may have with one of our providers in the next 365 days. If you have a MyChart account, a copy of this consent can be sent to you electronically.  As this is a virtual visit, video technology does not allow for your provider to perform a traditional examination. This may limit your provider's ability to fully assess your condition. If your provider identifies any concerns that need to be evaluated in person or the need to arrange testing (such as labs, EKG, etc.), we will make arrangements to do so. Although advances in technology are sophisticated, we cannot ensure that it will always work on either your end or our end. If the connection with a video visit is poor, the visit may have to be switched to a telephone visit. With either a video or telephone visit, we are not always able to ensure that we have a secure connection.  By engaging in this virtual visit, you consent to the provision of healthcare and authorize for your insurance to be billed (if applicable) for the services provided during this visit. Depending on your insurance coverage, you may receive a charge related to this service.  I need to obtain your verbal consent now. Are you willing to proceed with your visit today? CAEDMON NOVAKOVIC has provided verbal consent on 02/16/2023 for a virtual visit (video or telephone). Margaretann Loveless, PA-C  Date: 02/16/2023 4:18 PM  Virtual Visit via Video Note   I, Margaretann Loveless, connected with  Willie Spears  ([id number], [birth year]) on 02/16/23 at  4:00 PM EST by a video-enabled telemedicine application and verified that I am speaking with the correct person using two identifiers.  Location: Patient: Virtual Visit Location  Patient: Mobile Provider: Virtual Visit Location Provider: Home Office   I discussed the limitations of evaluation and management by telemedicine and the availability of in person appointments. The patient expressed understanding and agreed to proceed.    History of Present Illness: Willie Spears is a 32 y.o. 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 Happened in April then again in November of this year. He has been treated with antifungal cream and Doxycycline successfully in the past. He does feel that masturbation is a trigger for him as each occurrence has been after this. He does admit to not using any lubrication with masturbation. He is also diabetic and has been having a harder time with his blood sugar. He has been on Ozempic and Metformin. He reports fasting glucose readings are still in 210s. He does have a follow up scheduled with his PCP office on 03/10/23.   Problems:  Patient Active Problem List   Diagnosis Date Noted   Hypertension associated with stage 2 chronic kidney disease due to type 2 diabetes mellitus (HCC) 10/01/2020   Obesity, Class III, BMI 40-49.9 (morbid obesity) (HCC) 10/01/2020   Microalbuminuria 07/10/2020   Type 2 diabetes mellitus (HCC) 03/29/2015   Vitamin D deficiency 03/29/2015   Family history of diabetes mellitus 03/28/2015    Allergies: No Known Allergies Medications:  Current Outpatient Medications:    clotrimazole-betamethasone (LOTRISONE) cream, Apply 1 Application topically daily., Disp:  30 g, Rfl: 0   fluconazole (DIFLUCAN) 150 MG tablet, Take 1 tablet (150 mg total) by mouth once for 1 dose., Disp: 1 tablet, Rfl: 0   amLODipine (NORVASC) 5 MG tablet, Take 1 tablet (5 mg total) by mouth daily., Disp: 90 tablet, Rfl: 2   Blood Glucose Monitoring Suppl (TRUE METRIX METER) w/Device KIT, Use to check blood sugar twice daily., Disp: 1 kit, Rfl: 0   Blood Pressure  Monitoring (BLOOD PRESSURE KIT) DEVI, Use to measure blood pressure, Disp: 1 each, Rfl: 0   glucose blood (TRUE METRIX BLOOD GLUCOSE TEST) test strip, Use as instructed to check blood sugar twice daily., Disp: 100 each, Rfl: 2   metFORMIN (GLUCOPHAGE-XR) 500 MG 24 hr tablet, Take 2 tablets (1,000 mg total) by mouth 2 (two) times daily with a meal., Disp: 120 tablet, Rfl: 6   methocarbamol (ROBAXIN) 500 MG tablet, Take 1 tablet (500 mg total) by mouth 2 (two) times daily., Disp: 20 tablet, Rfl: 0   Semaglutide, 2 MG/DOSE, (OZEMPIC, 2 MG/DOSE,) 8 MG/3ML SOPN, Inject 2 mg into the skin once a week. Please make PCP appointment., Disp: 3 mL, Rfl: 0   traMADol (ULTRAM) 50 MG tablet, Take 1 tablet (50 mg total) by mouth every 6 (six) hours as needed (pain)., Disp: 12 tablet, Rfl: 0   valsartan-hydrochlorothiazide (DIOVAN-HCT) 320-25 MG tablet, Take 1 tablet by mouth daily., Disp: 90 tablet, Rfl: 3  Observations/Objective: Patient is well-developed, well-nourished in no acute distress.  Resting comfortably  Head is normocephalic, atraumatic.  No labored breathing. Speech is clear and coherent with logical content.  Patient is alert and oriented at baseline.    Assessment and Plan: 1. Balanitis (Primary) - clotrimazole-betamethasone (LOTRISONE) cream; Apply 1 Application topically daily.  Dispense: 30 g; Refill: 0 - fluconazole (DIFLUCAN) 150 MG tablet; Take 1 tablet (150 mg total) by mouth once for 1 dose.  Dispense: 1 tablet; Refill: 0  - Will treat for yeast balanitis from masturbation and friction irritation; uncontrolled T2DM may contribute as well. - Lotrisone and Fluconazole prescribed - Keep area clean and dry - Discussed washing hands and using lubrication more with masturbation - Talk with PCP for T2DM management - Discussed possible considerations of referral to Urology if continues to be recurrent; can discuss with PCP - Seek in person evaluation if continues or worsens  Follow Up  Instructions: I discussed the assessment and treatment plan with the patient. The patient was provided an opportunity to ask questions and all were answered. The patient agreed with the plan and demonstrated an understanding of the instructions.  A copy of instructions were sent to the patient via MyChart unless otherwise noted below.    The patient was advised to call back or seek an in-person evaluation if the symptoms worsen or if the condition fails to improve as anticipated.    Margaretann Loveless, PA-C

## 2023-03-10 ENCOUNTER — Encounter: Payer: Self-pay | Admitting: Family Medicine

## 2023-03-10 ENCOUNTER — Encounter: Payer: Self-pay | Admitting: Critical Care Medicine

## 2023-03-10 ENCOUNTER — Ambulatory Visit: Payer: Medicaid Other | Attending: Critical Care Medicine | Admitting: Family Medicine

## 2023-03-10 ENCOUNTER — Other Ambulatory Visit: Payer: Self-pay

## 2023-03-10 VITALS — BP 144/97 | HR 93 | Ht 71.0 in | Wt 252.0 lb

## 2023-03-10 DIAGNOSIS — E1122 Type 2 diabetes mellitus with diabetic chronic kidney disease: Secondary | ICD-10-CM

## 2023-03-10 DIAGNOSIS — E1165 Type 2 diabetes mellitus with hyperglycemia: Secondary | ICD-10-CM | POA: Diagnosis not present

## 2023-03-10 DIAGNOSIS — N182 Chronic kidney disease, stage 2 (mild): Secondary | ICD-10-CM

## 2023-03-10 DIAGNOSIS — Z7985 Long-term (current) use of injectable non-insulin antidiabetic drugs: Secondary | ICD-10-CM

## 2023-03-10 DIAGNOSIS — Z7984 Long term (current) use of oral hypoglycemic drugs: Secondary | ICD-10-CM

## 2023-03-10 DIAGNOSIS — I129 Hypertensive chronic kidney disease with stage 1 through stage 4 chronic kidney disease, or unspecified chronic kidney disease: Secondary | ICD-10-CM | POA: Diagnosis not present

## 2023-03-10 LAB — POCT GLYCOSYLATED HEMOGLOBIN (HGB A1C): HbA1c, POC (controlled diabetic range): 10.2 % — AB (ref 0.0–7.0)

## 2023-03-10 MED ORDER — CHLORTHALIDONE 25 MG PO TABS
25.0000 mg | ORAL_TABLET | Freq: Every day | ORAL | 1 refills | Status: DC
  Filled 2023-03-10: qty 90, 90d supply, fill #0

## 2023-03-10 MED ORDER — SEMAGLUTIDE (1 MG/DOSE) 4 MG/3ML ~~LOC~~ SOPN
1.0000 mg | PEN_INJECTOR | SUBCUTANEOUS | 3 refills | Status: DC
  Filled 2023-03-10: qty 3, 28d supply, fill #0
  Filled 2023-04-17: qty 3, 28d supply, fill #1

## 2023-03-10 MED ORDER — AMLODIPINE BESYLATE 10 MG PO TABS
10.0000 mg | ORAL_TABLET | Freq: Every day | ORAL | 1 refills | Status: DC
  Filled 2023-03-10: qty 90, 90d supply, fill #0

## 2023-03-10 MED ORDER — PIOGLITAZONE HCL 15 MG PO TABS
15.0000 mg | ORAL_TABLET | Freq: Every day | ORAL | 1 refills | Status: DC
  Filled 2023-03-10: qty 90, 90d supply, fill #0

## 2023-03-10 MED ORDER — SYNJARDY 12.5-500 MG PO TABS
1.0000 | ORAL_TABLET | Freq: Two times a day (BID) | ORAL | 1 refills | Status: DC
  Filled 2023-03-10: qty 180, 90d supply, fill #0

## 2023-03-10 NOTE — Patient Instructions (Signed)
 VISIT SUMMARY:  During today's visit, we discussed your diabetes and hypertension management. You expressed dissatisfaction with your current medications and reported some concerns, including erectile problems and leg swelling. We reviewed your current treatment plan and made some adjustments to better manage your conditions.  YOUR PLAN:  -TYPE 2 DIABETES MELLITUS: Type 2 Diabetes Mellitus is a condition where your body does not use insulin  properly, leading to high blood sugar levels. Your A1C level is currently 10.2, which indicates poor control. We will switch your Metformin  to Synjardy  12.5/1000mg , continue your Ozempic  at 1mg , and add Actos  15mg . We will also order labs to check your kidney and liver function.  -HYPERTENSION: Hypertension, or high blood pressure, can lead to serious health issues if not managed properly. You have been taking Valsartan /HCTZ but wish to discontinue it due to perceived erectile dysfunction. We will discontinue Valsartan /HCTZ, increase your Amlodipine  to 10mg , and add a diuretic to manage the swelling in your legs.  -GENERAL HEALTH MAINTENANCE: Maintaining a healthy diet and regular exercise is crucial for managing your overall health. We encourage you to continue cooking at home and start exercising more. We will follow up in 3 months to reassess your A1C and blood pressure control.  INSTRUCTIONS:  Please follow the new medication plan as discussed. We will need to get labs done to check your kidney and liver function. Schedule a follow-up appointment in 3 months to reassess your A1C and blood pressure control.

## 2023-03-10 NOTE — Progress Notes (Signed)
 Subjective:  Patient ID: Willie Spears, male    DOB: 12/24/1990  Age: 33 y.o. MRN: [id number]  CC: Medical Management of Chronic Issues (Wants to change Amlodipine  medication)   HPI Willie Spears is a 33 y.o. year old male patient of Dr. Brien with a history of hypertension, type 2 diabetes mellitus.  Interval History: Discussed the use of AI scribe software for clinical note transcription with the patient, who gave verbal consent to proceed.  He has been using medications from other family members who also have diabetes due to running out of his medications.  His last office visit in the clinic was in 11/2021.  The patient reports dissatisfaction with metformin  and expresses a desire to switch to Synjardy  which he got from his mom and he stated controlled his blood sugar.SABRA  He has also been administering his Ozempic  however he has not been taking 2 mg as he experienced nausea with this but rather has been taking 1 mg.  The patient also reports erectile problems which he attributes to one of his medications -he is show it is valsartan /HCTZ but admits that diuretic portion of his medication has helped with his pedal edema.  Amlodipine  appears on his med list but he has not been taking it.  The patient has been trying to manage his health by cooking at home and plans to start exercising more. The patient's last A1c was 10.2.        Past Medical History:  Diagnosis Date   AKI (acute kidney injury) (HCC) 06/18/2020   Diabetes mellitus without complication (HCC)    Hypertension    Scrotal abscess 05/01/2018    Past Surgical History:  Procedure Laterality Date   IRRIGATION AND DEBRIDEMENT ABSCESS N/A 05/02/2018   Procedure: IRRIGATION AND DEBRIDEMENT ABSCESS scrotal;  Surgeon: Devere Lonni Righter, MD;  Location: [facility];  Service: Urology;  Laterality: N/A;    Family History  Problem Relation Age of Onset   Diabetes Mother    Diabetes Father     Social History   Socioeconomic  History   Marital status: Single    Spouse name: Not on file   Number of children: Not on file   Years of education: Not on file   Highest education level: Not on file  Occupational History   Not on file  Tobacco Use   Smoking status: Former    Current packs/day: 0.00    Types: Cigarettes    Quit date: 06/13/2020    Years since quitting: 2.7   Smokeless tobacco: Never  Vaping Use   Vaping status: Never Used  Substance and Sexual Activity   Alcohol use: No   Drug use: Yes    Types: Marijuana   Sexual activity: Yes  Other Topics Concern   Not on file  Social History Narrative   Not on file   Social Drivers of Health   Financial Resource Strain: Not on file  Food Insecurity: Not on file  Transportation Needs: Not on file  Physical Activity: Not on file  Stress: Not on file  Social Connections: Not on file    No Known Allergies  Outpatient Medications Prior to Visit  Medication Sig Dispense Refill   amLODipine  (NORVASC ) 5 MG tablet Take 1 tablet (5 mg total) by mouth daily. 90 tablet 2   metFORMIN  (GLUCOPHAGE -XR) 500 MG 24 hr tablet Take 2 tablets (1,000 mg total) by mouth 2 (two) times daily with a meal. 120 tablet 6   Semaglutide , 2  MG/DOSE, (OZEMPIC , 2 MG/DOSE,) 8 MG/3ML SOPN Inject 2 mg into the skin once a week. Please make PCP appointment. 3 mL 0   Blood Glucose Monitoring Suppl (TRUE METRIX METER) w/Device KIT Use to check blood sugar twice daily. (Patient not taking: Reported on 03/10/2023) 1 kit 0   Blood Pressure Monitoring (BLOOD PRESSURE KIT) DEVI Use to measure blood pressure (Patient not taking: Reported on 03/10/2023) 1 each 0   clotrimazole -betamethasone  (LOTRISONE ) cream Apply 1 Application topically daily. (Patient not taking: Reported on 03/10/2023) 30 g 0   glucose blood (TRUE METRIX BLOOD GLUCOSE TEST) test strip Use as instructed to check blood sugar twice daily. (Patient not taking: Reported on 03/10/2023) 100 each 2   methocarbamol  (ROBAXIN ) 500 MG tablet  Take 1 tablet (500 mg total) by mouth 2 (two) times daily. (Patient not taking: Reported on 03/10/2023) 20 tablet 0   traMADol  (ULTRAM ) 50 MG tablet Take 1 tablet (50 mg total) by mouth every 6 (six) hours as needed (pain). (Patient not taking: Reported on 03/10/2023) 12 tablet 0   valsartan -hydrochlorothiazide  (DIOVAN -HCT) 320-25 MG tablet Take 1 tablet by mouth daily. (Patient not taking: Reported on 03/10/2023) 90 tablet 3   No facility-administered medications prior to visit.     ROS Review of Systems  Constitutional:  Negative for activity change and appetite change.  HENT:  Negative for sinus pressure and sore throat.   Respiratory:  Negative for chest tightness, shortness of breath and wheezing.   Cardiovascular:  Negative for chest pain and palpitations.  Gastrointestinal:  Negative for abdominal distention, abdominal pain and constipation.  Genitourinary: Negative.   Musculoskeletal: Negative.   Psychiatric/Behavioral:  Negative for behavioral problems and dysphoric mood.     Objective:  BP (!) 144/97   Pulse 93   Ht 5' 11 (1.803 m)   Wt 252 lb (114.3 kg)   SpO2 100%   BMI 35.15 kg/m      03/10/2023    3:41 PM 03/10/2023    3:21 PM 07/27/2022    2:20 PM  BP/Weight  Systolic BP 144 151 158  Diastolic BP 97 95 106  Wt. (Lbs)  252   BMI  35.15 kg/m2       Physical Exam Constitutional:      Appearance: He is well-developed.  Cardiovascular:     Rate and Rhythm: Normal rate.     Heart sounds: Normal heart sounds. No murmur heard. Pulmonary:     Effort: Pulmonary effort is normal.     Breath sounds: Normal breath sounds. No wheezing or rales.  Chest:     Chest wall: No tenderness.  Abdominal:     General: Bowel sounds are normal. There is no distension.     Palpations: Abdomen is soft. There is no mass.     Tenderness: There is no abdominal tenderness.  Musculoskeletal:        General: Normal range of motion.     Right lower leg: No edema.     Left lower leg: No  edema.  Neurological:     Mental Status: He is alert and oriented to person, place, and time.  Psychiatric:        Mood and Affect: Mood normal.        Latest Ref Rng & Units 06/19/2021   12:01 PM 07/09/2020    2:32 PM 06/18/2020   10:56 AM  CMP  Glucose 70 - 99 mg/dL 773  841  507   BUN 6 - 20 mg/dL 18  14  26   Creatinine 0.76 - 1.27 mg/dL 8.33  8.49  8.36   Sodium 134 - 144 mmol/L 138  140  131   Potassium 3.5 - 5.2 mmol/L 4.5  4.3  5.7   Chloride 96 - 106 mmol/L 103  104  97   CO2 20 - 29 mmol/L 21   26   Calcium  8.7 - 10.2 mg/dL 9.4  9.3  9.3   Total Protein 6.0 - 8.5 g/dL 6.2  6.4    Total Bilirubin 0.0 - 1.2 mg/dL <9.7  0.3    Alkaline Phos 44 - 121 IU/L 94  68    AST 0 - 40 IU/L 15  11    ALT 0 - 44 IU/L 15       Lipid Panel     Component Value Date/Time   CHOL 212 (H) 06/19/2021 1201   TRIG 170 (H) 06/19/2021 1201   HDL 59 06/19/2021 1201   CHOLHDL 3.6 06/19/2021 1201   LDLCALC 123 (H) 06/19/2021 1201    CBC    Component Value Date/Time   WBC 8.1 06/19/2021 1201   WBC 7.5 06/18/2020 1056   RBC 5.98 (H) 06/19/2021 1201   RBC 5.88 (H) 06/18/2020 1056   HGB 14.5 06/19/2021 1201   HCT 45.8 06/19/2021 1201   PLT 382 06/19/2021 1201   MCV 77 (L) 06/19/2021 1201   MCH 24.2 (L) 06/19/2021 1201   MCH 23.8 (L) 06/18/2020 1056   MCHC 31.7 06/19/2021 1201   MCHC 32.4 06/18/2020 1056   RDW 13.1 06/19/2021 1201   LYMPHSABS 2.6 06/19/2021 1201   MONOABS 0.7 05/05/2018 0503   EOSABS 0.2 06/19/2021 1201   BASOSABS 0.1 06/19/2021 1201    Lab Results  Component Value Date   HGBA1C 10.2 (A) 03/10/2023    Assessment & Plan:      Type 2 Diabetes Mellitus Poorly controlled with an A1C of 10.2. Patient has been using family members' Synjardy  and prefers it over Metformin . He has been taking Ozempic  at 1mg  due to nausea with 2mg  dose. -Change Metformin  to Synjardy  12.5/1000mg , pending insurance approval. -Continue Ozempic  at 1mg . -Add Actos  15mg . -Counseled on  Diabetic diet, my plate method, 849 minutes of moderate intensity exercise/week Blood sugar logs with fasting goals of 80-120 mg/dl, random of less than 819 and in the event of sugars less than 60 mg/dl or greater than 599 mg/dl encouraged to notify the clinic. Advised on the need for annual eye exams, annual foot exams, Pneumonia vaccine. -Order labs to check kidney and liver function. -Referred for LIBERATE study  Hypertension Patient has been taking Valsartan /HCTZ 320/25mg   but wishes to discontinue due to perceived erectile dysfunction. He has not been taking Amlodipine  5mg  as prescribed. -Discontinue Valsartan /HCTZ 320/25mg . -Increase Amlodipine  to 10mg . -Add a chlorthalidone  to manage peripheral edema.  General Health Maintenance -Encourage patient to maintain a healthy diet and regular exercise. -Plan to follow up in 3 months to reassess A1C and blood pressure control.          Meds ordered this encounter  Medications   amLODipine  (NORVASC ) 10 MG tablet    Sig: Take 1 tablet (10 mg total) by mouth daily.    Dispense:  90 tablet    Refill:  1    Dose increase   chlorthalidone  (HYGROTON ) 25 MG tablet    Sig: Take 1 tablet (25 mg total) by mouth daily.    Dispense:  90 tablet    Refill:  1  Discontinue Valsartan /HCTZ   Empagliflozin -metFORMIN  HCl (SYNJARDY ) 12.5-500 MG TABS    Sig: Take 1 tablet by mouth 2 (two) times daily.    Dispense:  180 tablet    Refill:  1    Discontinue metformin    pioglitazone  (ACTOS ) 15 MG tablet    Sig: Take 1 tablet (15 mg total) by mouth daily.    Dispense:  90 tablet    Refill:  1   Semaglutide , 1 MG/DOSE, 4 MG/3ML SOPN    Sig: Inject 1 mg as directed once a week.    Dispense:  3 mL    Refill:  3    Follow-up: Return in about 3 months (around 06/08/2023) for Chronic medical conditions.       Corrina Sabin, MD, FAAFP. [facility] [place], [place] [phone]   03/10/2023, 5:15 PM

## 2023-03-11 ENCOUNTER — Other Ambulatory Visit: Payer: Self-pay | Admitting: Family Medicine

## 2023-03-11 ENCOUNTER — Other Ambulatory Visit (HOSPITAL_COMMUNITY): Payer: Self-pay

## 2023-03-11 ENCOUNTER — Other Ambulatory Visit: Payer: Self-pay

## 2023-03-11 MED ORDER — SODIUM POLYSTYRENE SULFONATE 15 GM/60ML CO SUSP
15.0000 g | Freq: Once | 0 refills | Status: AC
  Filled 2023-03-11: qty 60, 1d supply, fill #0

## 2023-03-11 MED ORDER — EMPAGLIFLOZIN 25 MG PO TABS
25.0000 mg | ORAL_TABLET | Freq: Every day | ORAL | 1 refills | Status: DC
  Filled 2023-03-11: qty 30, 30d supply, fill #0
  Filled 2023-04-17: qty 30, 30d supply, fill #1

## 2023-03-11 MED ORDER — PIOGLITAZONE HCL 30 MG PO TABS
30.0000 mg | ORAL_TABLET | Freq: Every day | ORAL | 1 refills | Status: DC
  Filled 2023-03-11: qty 90, 90d supply, fill #0

## 2023-03-11 MED ORDER — ATORVASTATIN CALCIUM 20 MG PO TABS
20.0000 mg | ORAL_TABLET | Freq: Every day | ORAL | 1 refills | Status: AC
  Filled 2023-03-11: qty 90, 90d supply, fill #0

## 2023-03-12 ENCOUNTER — Other Ambulatory Visit: Payer: Self-pay

## 2023-03-12 LAB — CMP14+EGFR
ALT: 17 [IU]/L (ref 0–44)
AST: 12 [IU]/L (ref 0–40)
Albumin: 4.2 g/dL (ref 4.1–5.1)
Alkaline Phosphatase: 92 [IU]/L (ref 44–121)
BUN/Creatinine Ratio: 12 (ref 9–20)
BUN: 41 mg/dL — ABNORMAL HIGH (ref 6–20)
Bilirubin Total: 0.2 mg/dL (ref 0.0–1.2)
CO2: 23 mmol/L (ref 20–29)
Calcium: 10.1 mg/dL (ref 8.7–10.2)
Chloride: 102 mmol/L (ref 96–106)
Creatinine, Ser: 3.31 mg/dL — ABNORMAL HIGH (ref 0.76–1.27)
Globulin, Total: 2.7 g/dL (ref 1.5–4.5)
Glucose: 196 mg/dL — ABNORMAL HIGH (ref 70–99)
Potassium: 5.9 mmol/L — ABNORMAL HIGH (ref 3.5–5.2)
Sodium: 139 mmol/L (ref 134–144)
Total Protein: 6.9 g/dL (ref 6.0–8.5)
eGFR: 25 mL/min/{1.73_m2} — ABNORMAL LOW (ref >59)

## 2023-03-12 LAB — MICROALBUMIN / CREATININE URINE RATIO
Creatinine, Urine: 86.1 mg/dL
Microalb/Creat Ratio: 2358 mg/g{creat} — ABNORMAL HIGH (ref 0–29)
Microalbumin, Urine: 2030.4 ug/mL

## 2023-03-12 LAB — LP+NON-HDL CHOLESTEROL
Cholesterol, Total: 257 mg/dL — ABNORMAL HIGH (ref 100–199)
HDL: 57 mg/dL (ref >39)
LDL Chol Calc (NIH): 164 mg/dL — ABNORMAL HIGH (ref 0–99)
Total Non-HDL-Chol (LDL+VLDL): 200 mg/dL — ABNORMAL HIGH (ref 0–129)
Triglycerides: 199 mg/dL — ABNORMAL HIGH (ref 0–149)
VLDL Cholesterol Cal: 36 mg/dL (ref 5–40)

## 2023-03-16 ENCOUNTER — Telehealth: Payer: Self-pay | Admitting: Pharmacist

## 2023-03-16 NOTE — Telephone Encounter (Signed)
 LIBERATE Study  Received referral for patient participation in the LIBERATE CGM Study. Contacted patient to discuss study. Patient was not available so I left a HIPAA-compliant message with instructions to return my call.   Herlene Fleeta Morris, PharmD, JAQUELINE, CPP Clinical Pharmacist [facility] & [facility] [phone]

## 2023-03-30 ENCOUNTER — Ambulatory Visit: Payer: Medicaid Other | Admitting: Pharmacist

## 2023-03-31 ENCOUNTER — Ambulatory Visit: Payer: Medicaid Other | Admitting: Pharmacist

## 2023-04-05 ENCOUNTER — Encounter: Payer: Self-pay | Admitting: Family Medicine

## 2023-04-06 ENCOUNTER — Telehealth: Payer: Medicaid Other | Admitting: Family Medicine

## 2023-04-06 ENCOUNTER — Telehealth: Payer: Medicaid Other | Admitting: Physician Assistant

## 2023-04-06 ENCOUNTER — Ambulatory Visit: Payer: Medicaid Other | Attending: Critical Care Medicine | Admitting: Pharmacist

## 2023-04-06 ENCOUNTER — Telehealth: Payer: Medicaid Other

## 2023-04-06 ENCOUNTER — Telehealth: Payer: Self-pay | Admitting: Pharmacist

## 2023-04-06 VITALS — Wt 246.0 lb

## 2023-04-06 DIAGNOSIS — N489 Disorder of penis, unspecified: Secondary | ICD-10-CM

## 2023-04-06 DIAGNOSIS — Z7985 Long-term (current) use of injectable non-insulin antidiabetic drugs: Secondary | ICD-10-CM | POA: Diagnosis not present

## 2023-04-06 DIAGNOSIS — Z7984 Long term (current) use of oral hypoglycemic drugs: Secondary | ICD-10-CM

## 2023-04-06 DIAGNOSIS — E1165 Type 2 diabetes mellitus with hyperglycemia: Secondary | ICD-10-CM

## 2023-04-06 LAB — POCT GLYCOSYLATED HEMOGLOBIN (HGB A1C): HbA1c, POC (controlled diabetic range): 9.7 % — AB (ref 0.0–7.0)

## 2023-04-06 NOTE — Progress Notes (Signed)
S:     Chief Complaint  Patient presents with   Diabetes   33 y.o. male who presents for diabetes evaluation, education, and management in the context of the LIBERATE Study.   PMH is significant for T2DM, HTN, obesity, .  Patient was referred and last seen by Primary Care Provider, Dr. Alvis Lemmings on 03/10/23. Patient was last seen by pharmacy in 2021.At last visit with PCP, patient was found to have an AKI with Scr 3.31 and K 5.9. He was treated with Kayexalate and instructed to return for repeat labs in one week (unfortunately, he did not return to clinic until 04/06/23). Per fill history, patient had not been adherent to maintenance medications since May 2024. His UACR had improved from 2556 to 2358. His BP was elevated to 144/97 and he was reporting ED with hydrochlorothiazide, therefore he was instructed to switch from valsartan-hydrochlorothiazide to chlorthalidone and increase amlodipine to 10 mg. For his diabetes, he was instructed to stop metformin due to kidney dysfunction, start pioglitazone 30 mg PO daily, and restart Ozempic 1 mg subcutaneous weekly. He was referred to pharmacy to be enrolled in the LIBERATE study.   Today, patient arrives in good spirits and presents without any assistance. He reports that he made all of the changes described above after his last PCP visit. He denies missed doses of his oral or injectable medications. He reports that recently he has increased physical activity (exercising daily) and improved his diet (focusing on protein and low-carb options). He is tolerating Ozempic well- he does take dramamine on the day that he takes his Ozempic injection to prevent vomiting, but states he does not have GI intolerance that is noticeable on other days of the week. He has lost weight since restarting Ozempic and is motivated to continue losing weight - notes personal goal of 200 lbs. Noted that patient had a virtual visit for genital laceration today and has had recurrent  yeast balanitis, though this was not discussed at appt today.   Patient reports Diabetes was diagnosed in 2017.   Family/Social History:  Family History  Problem Relation Age of Onset   Diabetes Mother    Diabetes Father      Current diabetes medications include: Ozempic 1 mg subcutaneous weekly (Wednesdays), pioglitazone 30 mg PO daily, Jardiance 25 mg PO daily Current hypertension medications include: chlorthalidone 25 mg PO daily, amlodipine 10 mg PO daily Current hyperlipidemia medications include: atorvastatin 20 mg Po daily  Patient reports adherence to taking all medications as prescribed since picking them up on 03/12/23- he does get off on his Ozempic doses by a couple days regularly  Do you feel that your medications are working for you? yes Have you been experiencing any side effects to the medications prescribed? no Do you have any problems obtaining medications due to transportation or finances? no Insurance coverage: Medicaid  Patient reports hypoglycemic events - got very tired, fatigued, loopy on his recent cruise - ate some peanut butter and slice of bread and symptoms improved. Did not check BG.  CGM Study Consent: Yes Study visit: Initial Visit  Diabetes Distress Scale Feeling like diabetes is taking up too much of my mental and physical energy every day.: A moderate problem Feeling that my doctor doesn't know enough about diabetes and diabetic care. : A slight problem Feeling angry, scared, and/or depressed when I think about living with diabetes : A moderate problem Feeling that my doctor doesn't give my clear directions on how to manage  my diabetes. : Not a problem Feeling that im not testing my blood sugars frequently enough.: Somewhat serious problem Feeling that I'm often failing with my diabetes routine: A moderate problem Feelling that friends and family are not supportive enough of self care efforts.: A moderate problem Feeling that diabetes controls my  life.: A moderate problem Feeling that my doctor doesn't take my concerns seriously enough: Not a problem Not feeling confident in my day to day ability to manage diabetes: Not a problem Feeling that I will end up with serious long term complications no matter what I do.: A moderate problem Feeling that I am not sticking closely enough to a good meal plan.: Not a problem Feeling that friends or family don't appreciate how difficult living with diabetes can be. : A slight problem Feeling overwhelmed by the demands of living with diabetes.: A moderate problem Feeling that I don't have a doctor who I can see.: A slight problem Not feeling motivated to keep up my diabetes self management.: Not a problem Feeling that friends or family don't give me the emotional support that I would like. : A slight problem DDS17 Score: 38 Emotional Burden Score: 3 Physician related distress score: 1.5 Regimen Related Distress score : 2 Interpersonal distress score: 2.33     Patient denies nocturia (nighttime urination).  Patient denies neuropathy (nerve pain). Patient denies visual changes. Reports that opthalmologist did recommend surgery, but he is unsure of the indication. Patient reports self foot exams.   Patient reported dietary habits: Eats 2 meals/day. No bread, carb-balance tortilla wraps. Mainly eating protein and vegetables (green beans, broccoli) Drinks: water, flavored-water, drinks with 2-10g carbohydrates  Within the past 12 months, did you worry whether your food would run out before you got money to buy more? no Within the past 12 months, did the food you bought run out, and you didn't have money to get more? no  Patient-reported exercise habits: working out 30 min 3x per week, has a treadmill and is doing resistance training   O:   ROS  Physical Exam   Lab Results  Component Value Date   HGBA1C 9.7 (A) 04/06/2023    POC A1c Today: 9.7%  There were no vitals filed for this  visit.   Lipid Panel     Component Value Date/Time   CHOL 257 (H) 03/10/2023 1548   TRIG 199 (H) 03/10/2023 1548   HDL 57 03/10/2023 1548   CHOLHDL 3.6 06/19/2021 1201   LDLCALC 164 (H) 03/10/2023 1548    Clinical Atherosclerotic Cardiovascular Disease (ASCVD): No  The ASCVD Risk score (Arnett DK, et al., 2019) failed to calculate for the following reasons:   The 2019 ASCVD risk score is only valid for ages 67 to 40   Patient is participating in a Managed Medicaid Plan:  No   A/P:  LIBERATE Study:  -Patient provided verbal consent to participate in the study. Consent documented in electronic medical record.  -Provided education on Libre 3 CGM. Collaborated to ensure Josephine Igo 3 app was downloaded on patient's phone. Educated on how to place sensor every 14 days, patient placed first sensor correctly and verbalized understanding of use, removal, and how to place next sensor. Discussed alarms. 8 sensors provided for a 3 month supply. Educated to contact the office if the sensor falls off early and replacements are needed before their next [employer].    Diabetes longstanding, currently uncontrolled with A1c 9.7% above goal < 7%. Patient is able to verbalize appropriate  hypoglycemia management plan. Medication adherence appears adequate over the past few weeks. Worsened control likely due to nonadherence in late 2024. Suspect that glycemic control will improve as patient continues to take GLP-1RA and TZD. Agree that patient should not take metformin if eGFR remains < 45 mL/min. BMP ordered today to reevaluate kidney function and electrolytes given abnormalities at last PCP visit. Given recurrent yeast balanitis, may need to hold SGLT2i- will reevaluate at follow-up. Emphasize importance of adherence to medications -Continued GLP-1 semaglutide (generic Ozempic) at 1 mg subcutaneous weekly on Wednesdays.  -Continued SGLT2-I empagliflozin (generic Jardiance) at 25 mg PO daily. Counseled on  sick day rules. -Continued pioglitazone 30 mg PO daily -Ordered BMP to reevaluate kidney function since being treated for hyperkalemia with kayexalate and starting SGTL2i. Patient reports that he is well-hydrated for labs today.  -Patient educated on purpose, proper use, and potential adverse effects of Ozempic, Jardiance, and pioglitazone.  -Extensively discussed pathophysiology of diabetes, adverse outcomes associated with T2DM, recommended lifestyle interventions, dietary effects on blood sugar control. Encouraged continued physical activity and improvement in diet. -Counseled on s/sx of and management of hypoglycemia.  -Next A1c anticipated May 2025.   ASCVD risk - primary prevention in patient with diabetes. Last LDL is 164 mg/dL not at goal of <24  mg/dL. ASCVD risk factors include T2DM, HTN, CKD.  High intensity statin indicated given T2DM + risk enhancers. Patient was recently restarted on atorvastatin 20 mg daily after most recent lipid panel -Continued atorvastatin 20 mg PO daily.   Written patient instructions provided. Patient verbalized understanding of treatment plan.  Total time in face to face counseling 40 minutes.    Follow-up:  Pharmacist 05/11/23. PCP clinic visit: needs to be scheduled.   Nils Pyle, PharmD PGY1 Pharmacy Resident

## 2023-04-06 NOTE — Progress Notes (Signed)
Pt did not show for visit. He was advised to be seen in person. DWB

## 2023-04-06 NOTE — Telephone Encounter (Signed)
 LIBERATE Study  Received referral for patient participation in the LIBERATE CGM Study. Contacted patient to discuss study and confirmed HIPAA identifiers. Confirmed patient was provided the LIBERATE Study Information Sheet and any questions were answered.   Confirmed that patient meets study criteria by having a diagnosis of Type 2 Diabetes, is not currently on insulin, and most recent A1c is >8%.  Patient provided verbal consent to participate in the study. Consent documented in electronic medical record.   - Confirmed that patient has a compatible smart phone to download Floyd[employer] 3 app. ([email or web address]) - Asked to download and create a Josephine Igo account prior to first study visit.  - Scheduled first study visit today. Confirmed patient has transportation to this appointment.  - Discussed use of MyChart in this study. Confirmed patient has an active MyChart account and is aware of their log in information.  Patient aware of pre-visit questionnaire that will be sent 2 days prior to their scheduled study visit and they will plan to complete before the visit.    Butch Penny, PharmD, P[place]y [place], CPP Clinical Pharmacist [facility] & [facility] [phone]

## 2023-04-06 NOTE — Progress Notes (Signed)
  Because of recurrent skin issue involving the genitals, I feel your condition warrants further evaluation and I recommend that you be seen in a face-to-face visit.   NOTE: There will be NO CHARGE for this E-Visit   If you are having a true medical emergency, please call 911.     For an urgent face to face visit, Portal has multiple urgent care centers for your convenience.  Click the link below for the full list of locations and hours, walk-in wait times, appointment scheduling options and driving directions:  Urgent Care - [place], [place], [place], [place], [place], [place]       Your MyChart E-visit questionnaire answers were reviewed by a board certified advanced clinical practitioner to complete your personal care plan based on your specific symptoms.    Thank you for using e-Visits.      I have spent 5 minutes in review of e-visit questionnaire, review and updating patient chart, medical decision making and response to patient.   Margaretann Loveless, PA-C

## 2023-04-06 NOTE — Progress Notes (Unsigned)
Virtual Visit Consent   [name], you are scheduled for a virtual visit with a [facility] provider today. Just as with appointments in the office, your consent must be obtained to participate. Your consent will be active for this visit and any virtual visit you may have with one of our providers in the next 365 days. If you have a MyChart account, a copy of this consent can be sent to you electronically.  As this is a virtual visit, video technology does not allow for your provider to perform a traditional examination. This may limit your provider's ability to fully assess your condition. If your provider identifies any concerns that need to be evaluated in person or the need to arrange testing (such as labs, EKG, etc.), we will make arrangements to do so. Although advances in technology are sophisticated, we cannot ensure that it will always work on either your end or our end. If the connection with a video visit is poor, the visit may have to be switched to a telephone visit. With either a video or telephone visit, we are not always able to ensure that we have a secure connection.  By engaging in this virtual visit, you consent to the provision of healthcare and authorize for your insurance to be billed (if applicable) for the services provided during this visit. Depending on your insurance coverage, you may receive a charge related to this service.  I need to obtain your verbal consent now. Are you willing to proceed with your visit today? JAYDRIEN [name] has provided verbal consent on 04/06/2023 for a virtual visit (video or telephone). Margaretann Loveless, PA-C  Date: 04/06/2023 6:02 PM  Virtual Visit via Video Note   I, Margaretann Loveless, connected with  Willie Spears  ([id number], April 09, 1990) on 04/06/23 at  6:00 PM EST by a video-enabled telemedicine application and verified that I am speaking with the correct person using two identifiers.  Location: Patient: {Virtual Visit Location  Patient:25492::"Home"} Provider: Virtual Visit Location Provider: Home Office   I discussed the limitations of evaluation and management by telemedicine and the availability of in person appointments. The patient expressed understanding and agreed to proceed.    History of Present Illness: Willie Spears is a 33 y.o. who identifies as a male who was assigned male at birth, and is being seen today for ***.  HPI: HPI  Problems:  Patient Active Problem List   Diagnosis Date Noted   Hypertension associated with stage 2 chronic kidney disease due to type 2 diabetes mellitus (HCC) 10/01/2020   Obesity, Class III, BMI 40-49.9 (morbid obesity) (HCC) 10/01/2020   Microalbuminuria 07/10/2020   Type 2 diabetes mellitus (HCC) 03/29/2015   Vitamin D deficiency 03/29/2015   Family history of diabetes mellitus 03/28/2015    Allergies: No Known Allergies Medications:  Current Outpatient Medications:    amLODipine (NORVASC) 10 MG tablet, Take 1 tablet (10 mg total) by mouth daily., Disp: 90 tablet, Rfl: 1   atorvastatin (LIPITOR) 20 MG tablet, Take 1 tablet (20 mg total) by mouth daily., Disp: 90 tablet, Rfl: 1   Blood Glucose Monitoring Suppl (TRUE METRIX METER) w/Device KIT, Use to check blood sugar twice daily. (Patient not taking: Reported on 03/10/2023), Disp: 1 kit, Rfl: 0   Blood Pressure Monitoring (BLOOD PRESSURE KIT) DEVI, Use to measure blood pressure (Patient not taking: Reported on 03/10/2023), Disp: 1 each, Rfl: 0   chlorthalidone (HYGROTON) 25 MG tablet, Take 1 tablet (25 mg total)  by mouth daily., Disp: 90 tablet, Rfl: 1   clotrimazole-betamethasone (LOTRISONE) cream, Apply 1 Application topically daily. (Patient not taking: Reported on 03/10/2023), Disp: 30 g, Rfl: 0   empagliflozin (JARDIANCE) 25 MG TABS tablet, Take 1 tablet (25 mg total) by mouth daily before breakfast., Disp: 90 tablet, Rfl: 1   glucose blood (TRUE METRIX BLOOD GLUCOSE TEST) test strip, Use as instructed to check blood sugar  twice daily. (Patient not taking: Reported on 03/10/2023), Disp: 100 each, Rfl: 2   methocarbamol (ROBAXIN) 500 MG tablet, Take 1 tablet (500 mg total) by mouth 2 (two) times daily. (Patient not taking: Reported on 03/10/2023), Disp: 20 tablet, Rfl: 0   pioglitazone (ACTOS) 30 MG tablet, Take 1 tablet (30 mg total) by mouth daily., Disp: 90 tablet, Rfl: 1   Semaglutide, 1 MG/DOSE, 4 MG/3ML SOPN, Inject 1 mg as directed once a week., Disp: 3 mL, Rfl: 3   traMADol (ULTRAM) 50 MG tablet, Take 1 tablet (50 mg total) by mouth every 6 (six) hours as needed (pain). (Patient not taking: Reported on 03/10/2023), Disp: 12 tablet, Rfl: 0  Observations/Objective: Patient is well-developed, well-nourished in no acute distress.  Resting comfortably *** at home.  Head is normocephalic, atraumatic.  No labored breathing. *** Speech is clear and coherent with logical content.  Patient is alert and oriented at baseline.  ***  Assessment and Plan: There are no diagnoses linked to this encounter. ***  Follow Up Instructions: I discussed the assessment and treatment plan with the patient. The patient was provided an opportunity to ask questions and all were answered. The patient agreed with the plan and demonstrated an understanding of the instructions.  A copy of instructions were sent to the patient via MyChart unless otherwise noted below.   {EMAIL AVS:[id number]::"Patient has requested to receive PHI (AVS, Work Notes, etc) pertaining to this video visit through e-mail as they are currently without active MyChart. They have voiced understand that email is not considered secure and their health information could be viewed by someone other than the patient. "}  The patient was advised to call back or seek an in-person evaluation if the symptoms worsen or if the condition fails to improve as anticipated.    Margaretann Loveless, PA-C

## 2023-04-07 ENCOUNTER — Encounter: Payer: Self-pay | Admitting: Family Medicine

## 2023-04-07 ENCOUNTER — Other Ambulatory Visit: Payer: Self-pay | Admitting: Family Medicine

## 2023-04-07 DIAGNOSIS — E1122 Type 2 diabetes mellitus with diabetic chronic kidney disease: Secondary | ICD-10-CM

## 2023-04-07 LAB — BMP8+EGFR
BUN/Creatinine Ratio: 14 (ref 9–20)
BUN: 49 mg/dL — ABNORMAL HIGH (ref 6–20)
CO2: 19 mmol/L — ABNORMAL LOW (ref 20–29)
Calcium: 9.8 mg/dL (ref 8.7–10.2)
Chloride: 103 mmol/L (ref 96–106)
Creatinine, Ser: 3.49 mg/dL — ABNORMAL HIGH (ref 0.76–1.27)
Glucose: 151 mg/dL — ABNORMAL HIGH (ref 70–99)
Potassium: 4.4 mmol/L (ref 3.5–5.2)
Sodium: 142 mmol/L (ref 134–144)
eGFR: 23 mL/min/{1.73_m2} — ABNORMAL LOW (ref >59)

## 2023-04-08 NOTE — Patient Instructions (Signed)
Marlowe Aschoff, thank you for joining Margaretann Loveless, PA-C for today's virtual visit.  While this provider is not your primary care provider (PCP), if your PCP is located in our provider database this encounter information will be shared with them immediately following your visit.   A [facility] MyChart account gives you access to today's visit and all your visits, tests, and labs performed at [facility] " click here if you don't have a [facility] MyChart account or go to [email or web address]  Consent: (Patient) Marlowe Aschoff provided verbal consent for this virtual visit at the beginning of the encounter.  Current Medications:  Current Outpatient Medications:    amLODipine (NORVASC) 10 MG tablet, Take 1 tablet (10 mg total) by mouth daily., Disp: 90 tablet, Rfl: 1   atorvastatin (LIPITOR) 20 MG tablet, Take 1 tablet (20 mg total) by mouth daily., Disp: 90 tablet, Rfl: 1   Blood Glucose Monitoring Suppl (TRUE METRIX METER) w/Device KIT, Use to check blood sugar twice daily. (Patient not taking: Reported on 03/10/2023), Disp: 1 kit, Rfl: 0   Blood Pressure Monitoring (BLOOD PRESSURE KIT) DEVI, Use to measure blood pressure (Patient not taking: Reported on 03/10/2023), Disp: 1 each, Rfl: 0   chlorthalidone (HYGROTON) 25 MG tablet, Take 1 tablet (25 mg total) by mouth daily., Disp: 90 tablet, Rfl: 1   clotrimazole-betamethasone (LOTRISONE) cream, Apply 1 Application topically daily. (Patient not taking: Reported on 03/10/2023), Disp: 30 g, Rfl: 0   empagliflozin (JARDIANCE) 25 MG TABS tablet, Take 1 tablet (25 mg total) by mouth daily before breakfast., Disp: 90 tablet, Rfl: 1   glucose blood (TRUE METRIX BLOOD GLUCOSE TEST) test strip, Use as instructed to check blood sugar twice daily. (Patient not taking: Reported on 03/10/2023), Disp: 100 each, Rfl: 2   methocarbamol (ROBAXIN) 500 MG tablet, Take 1 tablet (500 mg total) by mouth 2 (two) times daily. (Patient not taking: Reported on  03/10/2023), Disp: 20 tablet, Rfl: 0   pioglitazone (ACTOS) 30 MG tablet, Take 1 tablet (30 mg total) by mouth daily., Disp: 90 tablet, Rfl: 1   Semaglutide, 1 MG/DOSE, 4 MG/3ML SOPN, Inject 1 mg as directed once a week., Disp: 3 mL, Rfl: 3   traMADol (ULTRAM) 50 MG tablet, Take 1 tablet (50 mg total) by mouth every 6 (six) hours as needed (pain). (Patient not taking: Reported on 03/10/2023), Disp: 12 tablet, Rfl: 0   Medications ordered in this encounter:  No orders of the defined types were placed in this encounter.    *If you need refills on other medications prior to your next appointment, please contact your pharmacy*  Follow-Up: Call back or seek an in-person evaluation if the symptoms worsen or if the condition fails to improve as anticipated.  [facility] [phone]  Other Instructions Seek in person evaluation for testing   If you have been instructed to have an in-person evaluation today at a local Urgent Care facility, please use the link below. It will take you to a list of all of our available [facility], including address, phone number and hours of operation. Please do not delay care.  [facility]  If you or a family member do not have a primary care provider, use the link below to schedule a visit and establish care. When you choose a [facility] primary care physician or advanced practice provider, you gain a long-term partner in health. Find a Primary Care Provider  Learn more  about [facility]'s in-office and virtual care options:  - Get Care Now

## 2023-04-09 NOTE — E-Visit Routing Comment (Signed)
 Willie Spears  This former pcp needs a workin visit for acute penile infection. Mmu ok

## 2023-04-13 ENCOUNTER — Telehealth: Payer: Self-pay | Admitting: *Deleted

## 2023-04-13 ENCOUNTER — Telehealth: Payer: Self-pay | Admitting: Pharmacist

## 2023-04-13 ENCOUNTER — Telehealth: Payer: Self-pay

## 2023-04-13 NOTE — Telephone Encounter (Signed)
 Patient returning call to make an appt with [facility]

## 2023-04-13 NOTE — Telephone Encounter (Signed)
Called patient and left voicemail about getting appointment.

## 2023-04-13 NOTE — Telephone Encounter (Signed)
 Copied from CRM [id number]. Topic: Clinical - Lab/Test Results >> Apr 13, 2023 11:11 AM Carlatta H wrote: Reason for CRM: Patient was returning Espiridion Heft call//Please call back

## 2023-04-13 NOTE — Telephone Encounter (Signed)
 LIBERATE Study  Patient completed first study visit for the LIBERATE CGM Study. Contacted patient to discuss CGM tolerability. He was unavailable so I left a HIPAA-compliant VM with instructions to return my call. His CGM report over the last 7 days is as follows:  Date of Download: 04/13/2023 % Time CGM is active: 91% Average Glucose: 166 mg/dL Glucose Management Indicator: 7.3  Glucose Variability: 26 (goal <36%) Time in Goal:  - Time in range 70-180: 74% - Time above range: 26% - Time below range: 0% Observed patterns:  [name], PharmD, [name], CPP Clinical Pharmacist [facility] [phone]

## 2023-04-13 NOTE — Telephone Encounter (Signed)
 LIBERATE Study  Patient completed first study visit for the LIBERATE CGM Study. Contacted patient to discuss CGM tolerability. Confirmed HIPAA identifiers.   Patient confirms Willie Spears 3 sensors is working and glucose values are transmitting appropriately. Denies any questions or concerns.   - Confirmed second visit w/ me next month. Confirmed patient has transportation to this appointment.  - Discussed use of MyChart in this study. Confirmed patient has an active MyChart account and is aware of their log in information.  Patient aware of pre-visit questionnaire that will be sent 2 days prior to their scheduled study visit and they will plan to complete before the visit.   [name], PharmD, [name], CPP Clinical Pharmacist [facility] & [facility] [phone]

## 2023-04-14 ENCOUNTER — Other Ambulatory Visit: Payer: Self-pay

## 2023-04-14 ENCOUNTER — Ambulatory Visit
Admission: RE | Admit: 2023-04-14 | Discharge: 2023-04-14 | Disposition: A | Discharge status: Home / Self Care | Payer: Medicaid Other | Source: Ambulatory Visit | Attending: Physician Assistant | Admitting: Physician Assistant

## 2023-04-14 VITALS — BP 168/103 | HR 96 | Temp 97.9°F | Resp 18

## 2023-04-14 DIAGNOSIS — Z113 Encounter for screening for infections with a predominantly sexual mode of transmission: Secondary | ICD-10-CM | POA: Diagnosis present

## 2023-04-14 DIAGNOSIS — N5089 Other specified disorders of the male genital organs: Secondary | ICD-10-CM | POA: Insufficient documentation

## 2023-04-14 MED ORDER — NYSTATIN 100000 UNIT/GM EX CREA: TOPICAL_CREAM | CUTANEOUS | 0 refills | Status: AC

## 2023-04-14 NOTE — ED Triage Notes (Signed)
Pt here for rash and "spot" on his penis; pt sts hx of yeast infection and unsure if same

## 2023-04-14 NOTE — ED Provider Notes (Signed)
[facility]    CSN: [id number] Arrival date & time: 04/14/23  1114      History   Chief Complaint Chief Complaint  Patient presents with   Exposure to STD    Maybe syphilis - Entered by patient   Rash    HPI Willie Spears is a 33 y.o. male.   Patient here today for evaluation of penile lesion.  He states that it is similar to prior yeast infection.  He notes that symptoms have improved since onset.  He denies any known STD exposure but would like STD screening.  He denies any penile discharge.   The history is provided by the patient.  Exposure to STD Pertinent negatives include no abdominal pain and no shortness of breath.  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 Authorizing Provider  nystatin cream (MYCOSTATIN) Apply to affected area 2 times daily 04/14/23  Yes Tomi Bamberger, PA-C  amLODipine (NORVASC) 10 MG tablet Take 1 tablet (10 mg total) by mouth daily. 03/10/23   Hoy Register, MD   atorvastatin (LIPITOR) 20 MG tablet Take 1 tablet (20 mg total) by mouth daily. 03/11/23   Hoy Register, MD  Blood Glucose Monitoring Suppl (TRUE METRIX METER) w/Device KIT Use to check blood sugar twice daily. Patient not taking: Reported on 03/10/2023 06/19/21   Storm Frisk, MD  Blood Pressure Monitoring (BLOOD PRESSURE KIT) DEVI Use to measure blood pressure Patient not taking: Reported on 03/10/2023 11/21/21   Storm Frisk, MD  chlorthalidone (HYGROTON) 25 MG tablet Take 1 tablet (25 mg total) by mouth daily. 03/10/23   Hoy Register, MD  clotrimazole-betamethasone (LOTRISONE) cream Apply 1 Application topically daily. Patient not taking: Reported on 03/10/2023 02/16/23   Margaretann Loveless, PA-C  empagliflozin (JARDIANCE) 25 MG TABS tablet Take 1 tablet (25 mg total) by mouth daily before breakfast. 03/11/23   Hoy Register, MD  glucose blood (TRUE METRIX BLOOD GLUCOSE TEST) test strip Use as instructed to check blood sugar twice daily. Patient not taking: Reported on 03/10/2023 06/19/21   Storm Frisk, MD  methocarbamol (ROBAXIN) 500 MG tablet Take 1 tablet (500 mg total) by mouth 2 (two) times daily. Patient not taking: Reported on 03/10/2023 04/17/22   Nira Conn, MD  pioglitazone (ACTOS) 30 MG tablet Take 1 tablet (30 mg total) by mouth daily. 03/11/23   Hoy Register, MD  Semaglutide, 1 MG/DOSE, 4 MG/3ML SOPN Inject 1 mg as directed once a week. 03/10/23  Hoy Register, MD  traMADol (ULTRAM) 50 MG tablet Take 1 tablet (50 mg total) by mouth every 6 (six) hours as needed (pain). Patient not taking: Reported on 03/10/2023 07/14/22   Zenia Resides, MD  hydrochlorothiazide (HYDRODIURIL) 50 MG tablet Take 1 tablet (50 mg total) by mouth daily. Patient not taking: Reported on 03/18/2019 05/06/18 03/18/19  Burnadette Pop, MD  insulin aspart (NOVOLOG FLEXPEN) 100 UNIT/ML FlexPen Inject 5 Units into the skin 3 (three) times daily with meals for 30 days. Patient not taking:  Reported on 03/18/2019 05/05/18 03/18/19  Burnadette Pop, MD  insulin regular (NOVOLIN R) 100 units/mL injection Inject 0.1 mLs (10 Units total) into the skin 3 (three) times daily before meals. 12/30/19 01/13/20  Cain Saupe, MD    Family History Family History  Problem Relation Age of Onset   Diabetes Mother    Diabetes Father     Social History Social History   Tobacco Use   Smoking status: Former    Current packs/day: 0.00    Types: Cigarettes    Quit date: 06/13/2020    Years since quitting: 2.8   Smokeless tobacco: Never  Vaping Use   Vaping status: Never Used  Substance Use Topics   Alcohol use: No   Drug use: Yes    Types: Marijuana     Allergies   Patient has no known allergies.   Review of Systems Review of Systems  Constitutional:  Negative for chills and fever.  Eyes:  Negative for discharge and redness.  Respiratory:  Negative for shortness of breath.   Gastrointestinal:  Negative for abdominal pain.  Genitourinary:  Positive for genital sores. Negative for penile discharge.  Skin:  Positive for color change and wound.  Neurological:  Negative for numbness.     Physical Exam Triage Vital Signs ED Triage Vitals  Encounter Vitals Group     BP 04/14/23 1133 (!) 168/103     Systolic BP Percentile --      Diastolic BP Percentile --      Pulse Rate 04/14/23 1133 96     Resp 04/14/23 1133 18     Temp 04/14/23 1133 97.9 F (36.6 C)     Temp Source 04/14/23 1133 Oral     SpO2 04/14/23 1133 97 %     Weight --      Height --      Head Circumference --      Peak Flow --      Pain Score 04/14/23 1134 0     Pain Loc --      Pain Education --      Exclude from Growth Chart --    No data found.  Updated Vital Signs BP (!) 168/103 (BP Location: Left Arm)   Pulse 96   Temp 97.9 F (36.6 C) (Oral)   Resp 18   SpO2 97%   Visual Acuity Right Eye Distance:   Left Eye Distance:   Bilateral Distance:    Right Eye Near:   Left Eye Near:     Bilateral Near:     Physical Exam Vitals and nursing note reviewed.  Constitutional:      General: He is not in acute distress.    Appearance: Normal appearance. He is not ill-appearing.  HENT:     Head: Normocephalic and atraumatic.  Eyes:     Conjunctiva/sclera: Conjunctivae normal.  Cardiovascular:     Rate and Rhythm: Normal rate.  Pulmonary:     Effort: Pulmonary effort  is normal. No respiratory distress.  Genitourinary:    Comments: Approx 1.5 cm superficial lesion to right distal penis just proximal to coronal edge, no active bleeding, purulent appearing  Chaperone- Laneta Simmers, RN Neurological:     Mental Status: He is alert.  Psychiatric:        Mood and Affect: Mood normal.        Behavior: Behavior normal.        Thought Content: Thought content normal.      UC Treatments / Results  Labs (all labs ordered are listed, but only abnormal results are displayed) Labs Reviewed  RPR  HIV ANTIBODY (ROUTINE TESTING W REFLEX)  CYTOLOGY, (ORAL, ANAL, URETHRAL) ANCILLARY ONLY    EKG   Radiology No results found.  Procedures Procedures (including critical care time)  Medications Ordered in UC Medications - No data to display  Initial Impression / Assessment and Plan / UC Course  I have reviewed the triage vital signs and the nursing notes.  Pertinent labs & imaging results that were available during my care of the patient were reviewed by me and considered in my medical decision making (see chart for details).    Given history of yeast infection with similar presentation will treat with topical nystatin but will also order STD screening. Advised follow up with any concerns or if symptoms fail to improve otherwise will await results for further recommendation.   Final Clinical Impressions(s) / UC Diagnoses   Final diagnoses:  Screening for STD (sexually transmitted disease)  Genital lesion, male   Discharge Instructions   None    ED Prescriptions      Medication Sig Dispense Auth. Provider   nystatin cream (MYCOSTATIN) Apply to affected area 2 times daily 30 g Tomi Bamberger, PA-C      PDMP not reviewed this encounter.   Tomi Bamberger, PA-C 04/14/23 1408

## 2023-04-15 LAB — CYTOLOGY, (ORAL, ANAL, URETHRAL) ANCILLARY ONLY
Chlamydia: NEGATIVE
Comment: NEGATIVE
Comment: NEGATIVE
Comment: NORMAL
Neisseria Gonorrhea: NEGATIVE
Trichomonas: NEGATIVE

## 2023-04-15 LAB — RPR: RPR Ser Ql: NONREACTIVE

## 2023-04-15 LAB — HIV ANTIBODY (ROUTINE TESTING W REFLEX): HIV Screen 4th Generation wRfx: NONREACTIVE

## 2023-04-17 ENCOUNTER — Telehealth: Payer: Self-pay | Admitting: Family Medicine

## 2023-04-17 ENCOUNTER — Other Ambulatory Visit: Payer: Self-pay

## 2023-04-17 ENCOUNTER — Other Ambulatory Visit: Payer: Self-pay | Admitting: Family Medicine

## 2023-04-17 MED ORDER — SEMAGLUTIDE (1 MG/DOSE) 4 MG/3ML ~~LOC~~ SOPN: 1.0000 mg | PEN_INJECTOR | SUBCUTANEOUS | 3 refills | Status: DC

## 2023-04-17 MED ORDER — EMPAGLIFLOZIN 25 MG PO TABS: 25.0000 mg | ORAL_TABLET | Freq: Every day | ORAL | 1 refills | Status: DC

## 2023-04-17 NOTE — Telephone Encounter (Signed)
Late entry: Patient received 30 from the pharmacy in [place]. Advised on voicemail to f.u with pharmacy for additional refills.

## 2023-04-17 NOTE — Telephone Encounter (Signed)
Last Fill: Jardiance: 03/11/23 90 tabs/1 refill     Semaglutide: 03/10/23 3 mL/3 refills  Last OV: Next OV:  Routing to provider for review/authorization.

## 2023-04-17 NOTE — Telephone Encounter (Signed)
Patient was called and he states that he has picked up his medication from the pharmacy.

## 2023-04-17 NOTE — Telephone Encounter (Signed)
Copied from CRM [id number]. Topic: Clinical - Medication Refill >> Apr 17, 2023 12:52 PM Gildardo Pounds wrote: Most Recent Primary Care Visit:  Provider: Drucilla Chalet  Department: [facility]  Visit Type: OFFICE VISIT  Date: 04/06/2023  Medication: empagliflozin (JARDIANCE) 25 MG TABS tablet Semaglutide, 1 MG/DOSE, 4 MG/3ML SOPN  Has the patient contacted their pharmacy? Yes (Agent: If no, request that the patient contact the pharmacy for the refill. If patient does not wish to contact the pharmacy document the reason why and proceed with request.) (Agent: If yes, when and what did the pharmacy advise?)  Is this the correct pharmacy for this prescription? Yes If no, delete pharmacy and type the correct one.  This is the patient's preferred pharmacy:   [facility] [address] [place] [address] Phone: [phone] Fax: [phone]   Has the prescription been filled recently? No  Is the patient out of the medication? Yes  Has the patient been seen for an appointment in the last year OR does the patient have an upcoming appointment? Yes  Can we respond through MyChart? Yes  Agent: Please be advised that Rx refills may take up to 3 business days. We ask that you follow-up with your pharmacy.

## 2023-04-17 NOTE — Telephone Encounter (Signed)
Copied from CRM [id number]. Topic: Clinical - Medication Question >> Apr 17, 2023  1:01 PM Yolanda T wrote: Reason for CRM: patient called stated he need his medication as he has not taken meds since yesterday. He is afraid his sugar will go up over the weekend without his medication. He has been advised of the 3 business day turn around timeframe.

## 2023-05-10 NOTE — Progress Notes (Unsigned)
 S:     No chief complaint on file.  33 y.o. male who presents for diabetes evaluation, education, and management in the context of the LIBERATE Study.   PMH is significant for T2DM, HTN, obesity, recurrent GU infections.   Patient was referred and last seen by Primary Care Provider, Dr. Alvis Lemmings on 03/10/23. Previously seen by pharmacy in 2021. At last visit with PCP, patient was found to have an AKI with Scr 3.31 and K 5.9. He was treated with Kayexalate and instructed to return for repeat labs in one week (unfortunately, he did not return to clinic until 04/06/23). Per fill history, patient had not been adherent to maintenance medications since May 2024. His UACR had improved from 2556 to 2358. His BP was elevated to 144/97 and he was reporting ED with hydrochlorothiazide, therefore he was instructed to switch from valsartan-hydrochlorothiazide to chlorthalidone and increase amlodipine to 10 mg. For his diabetes, he was instructed to stop metformin due to kidney dysfunction, start pioglitazone 30 mg PO daily, and restart Ozempic 1 mg subcutaneous weekly. He was referred to pharmacy to be enrolled in the LIBERATE study.   Today, patient arrives in good spirits and presents without any assistance. He reports that he made all of the changes described above after his last PCP visit. He denies missed doses of his oral or injectable medications. He reports that recently he has increased physical activity (exercising daily) and improved his diet (focusing on protein and low-carb options). He is tolerating Ozempic well- he does take dramamine on the day that he takes his Ozempic injection to prevent vomiting, but states he does not have GI intolerance that is noticeable on other days of the week. He has lost weight since restarting Ozempic and is motivated to continue losing weight - notes personal goal of 200 lbs. Noted that patient had a virtual visit for genital laceration today and has had recurrent yeast  balanitis, though this was not discussed at appt today.   Patient reports Diabetes was diagnosed in 2017.   Family/Social History:  Family History  Problem Relation Age of Onset   Diabetes Mother    Diabetes Father      Current diabetes medications include: Ozempic 1 mg subcutaneous weekly (Wednesdays), pioglitazone 30 mg PO daily, Jardiance 25 mg PO daily Current hypertension medications include: chlorthalidone 25 mg PO daily, amlodipine 10 mg PO daily Current hyperlipidemia medications include: atorvastatin 20 mg Po daily  Patient reports adherence to taking all medications as prescribed since picking them up on 03/12/23- he does get off on his Ozempic doses by a couple days regularly  Do you feel that your medications are working for you? yes Have you been experiencing any side effects to the medications prescribed? no Do you have any problems obtaining medications due to transportation or finances? no Insurance coverage: Medicaid  Patient reports hypoglycemic events - got very tired, fatigued, loopy on his recent cruise - ate some peanut butter and slice of bread and symptoms improved. Did not check BG.       Patient denies nocturia (nighttime urination).  Patient denies neuropathy (nerve pain). Patient denies visual changes. Reports that opthalmologist did recommend surgery, but he is unsure of the indication. Patient reports self foot exams.   Patient reported dietary habits: Eats 2 meals/day. No bread, carb-balance tortilla wraps. Mainly eating protein and vegetables (green beans, broccoli) Drinks: water, flavored-water, drinks with 2-10g carbohydrates  Within the past 12 months, did you worry whether your food  would run out before you got money to buy more? no Within the past 12 months, did the food you bought run out, and you didn't have money to get more? no  Patient-reported exercise habits: working out 30 min 3x per week, has a treadmill and is doing resistance  training   O:   ROS  Physical Exam  Date of Download: 04/27/23 to 05/10/23 % Time CGM is active: 92% Average Glucose: 144 mg/dL Glucose Management Indicator: 6.8  Glucose Variability: 28.3 (goal <36%) Time in Goal:  - Time in range 70-180: 86% - Time above range: 14% - Time below range: 0% Observed patterns: occasional spikes overnight ~12 AM, otherwise steady and controlled     Lab Results  Component Value Date   HGBA1C 9.7 (A) 04/06/2023    There were no vitals filed for this visit.     Latest Ref Rng & Units 04/06/2023    2:58 PM 03/10/2023    3:48 PM 06/19/2021   12:01 PM  BMP  Glucose 70 - 99 mg/dL 161  096  045   BUN 6 - 20 mg/dL 49  41  18   Creatinine 0.76 - 1.27 mg/dL 4.09  8.11  9.14   BUN/Creat Ratio 9 - 20 14  12  11    Sodium 134 - 144 mmol/L 142  139  138   Potassium 3.5 - 5.2 mmol/L 4.4  5.9  4.5   Chloride 96 - 106 mmol/L 103  102  103   CO2 20 - 29 mmol/L 19  23  21    Calcium 8.7 - 10.2 mg/dL 9.8  78.2  9.4    UACR 03/10/23:  2358 mg/g (previous 2556 mg/g)  Lipid Panel     Component Value Date/Time   CHOL 257 (H) 03/10/2023 1548   TRIG 199 (H) 03/10/2023 1548   HDL 57 03/10/2023 1548   CHOLHDL 3.6 06/19/2021 1201   LDLCALC 164 (H) 03/10/2023 1548    Clinical Atherosclerotic Cardiovascular Disease (ASCVD): No  The ASCVD Risk score (Arnett DK, et al., 2019) failed to calculate for the following reasons:   The 2019 ASCVD risk score is only valid for ages 67 to 75   Patient is participating in a Managed Medicaid Plan:  No  F/u - may need to hold SGLT2i with recurrent yeast infections... F/u increasing Ozempic to 2 mg Schedule 3 mo Libre f/u.  F/u nephrology ? Cr 3.49, K 4.4, eGFR 23  A/P:  LIBERATE Study:  -Patient provided verbal consent to participate in the study. Consent documented in electronic medical record.  -Provided education on Libre 3 CGM. Collaborated to ensure Josephine Igo 3 app was downloaded on patient's phone. Educated on how to  place sensor every 14 days, patient placed first sensor correctly and verbalized understanding of use, removal, and how to place next sensor. Discussed alarms. 8 sensors provided for a 3 month supply. Educated to contact the office if the sensor falls off early and replacements are needed before their next [employer].    Diabetes longstanding, currently uncontrolled with A1c 9.7% above goal < 7%. Patient is able to verbalize appropriate hypoglycemia management plan. Medication adherence appears adequate over the past few weeks. Worsened control likely due to nonadherence in late 2024. Suspect that glycemic control will improve as patient continues to take GLP-1RA and TZD. Agree that patient should not take metformin if eGFR remains < 45 mL/min. BMP ordered today to reevaluate kidney function and electrolytes given abnormalities at last PCP visit.  Given recurrent yeast balanitis, may need to hold SGLT2i- will reevaluate at follow-up. Emphasize importance of adherence to medications -Continued GLP-1 semaglutide (generic Ozempic) at 1 mg subcutaneous weekly on Wednesdays.  -Continued SGLT2-I empagliflozin (generic Jardiance) at 25 mg PO daily. Counseled on sick day rules. -Continued pioglitazone 30 mg PO daily -Ordered BMP to reevaluate kidney function since being treated for hyperkalemia with kayexalate and starting SGTL2i. Patient reports that he is well-hydrated for labs today.  -Patient educated on purpose, proper use, and potential adverse effects of Ozempic, Jardiance, and pioglitazone.  -Extensively discussed pathophysiology of diabetes, adverse outcomes associated with T2DM, recommended lifestyle interventions, dietary effects on blood sugar control. Encouraged continued physical activity and improvement in diet. -Counseled on s/sx of and management of hypoglycemia.  -Next A1c anticipated May 2025.   ASCVD risk - primary prevention in patient with diabetes. Last LDL is 164 mg/dL not at goal  of <86  mg/dL. ASCVD risk factors include T2DM, HTN, CKD.  High intensity statin indicated given T2DM + risk enhancers. Patient was recently restarted on atorvastatin 20 mg daily after most recent lipid panel -Continued atorvastatin 20 mg PO daily.   Written patient instructions provided. Patient verbalized understanding of treatment plan.  Total time in face to face counseling 40 minutes.    Follow-up:  Pharmacist 05/11/23. PCP clinic visit: needs to be scheduled.   Nils Pyle, PharmD PGY1 Pharmacy Resident

## 2023-05-11 ENCOUNTER — Ambulatory Visit: Payer: Medicaid Other | Attending: Family Medicine | Admitting: Pharmacist

## 2023-05-11 ENCOUNTER — Other Ambulatory Visit: Payer: Self-pay | Admitting: Nephrology

## 2023-05-11 ENCOUNTER — Encounter: Payer: Self-pay | Admitting: Pharmacist

## 2023-05-11 ENCOUNTER — Encounter

## 2023-05-11 ENCOUNTER — Other Ambulatory Visit: Payer: Self-pay | Admitting: Internal Medicine

## 2023-05-11 VITALS — BP 118/78 | HR 80 | Wt 238.0 lb

## 2023-05-11 DIAGNOSIS — E1165 Type 2 diabetes mellitus with hyperglycemia: Secondary | ICD-10-CM | POA: Diagnosis not present

## 2023-05-11 DIAGNOSIS — Z7984 Long term (current) use of oral hypoglycemic drugs: Secondary | ICD-10-CM

## 2023-05-11 DIAGNOSIS — Z7985 Long-term (current) use of injectable non-insulin antidiabetic drugs: Secondary | ICD-10-CM

## 2023-05-11 DIAGNOSIS — N184 Chronic kidney disease, stage 4 (severe): Secondary | ICD-10-CM

## 2023-05-11 NOTE — Patient Instructions (Signed)
 Medication Changes:  - Continue Ozempic 1 mg weekly - Continue Pioglitazone - Hold Jardiance for 1 month to allow yeast infection to clear, then restart (around the time you come to see Dr. Alvis Lemmings- let her know if you continue to have any issues)  Continue to follow up with Nephrology.

## 2023-05-15 LAB — LAB REPORT - SCANNED
Creatinine, POC: 175.8 mg/dL
EGFR: 22
HM HIV Screening: NEGATIVE
HM Hepatitis Screen: NEGATIVE

## 2023-05-27 ENCOUNTER — Encounter: Payer: Self-pay | Admitting: Nephrology

## 2023-05-28 ENCOUNTER — Ambulatory Visit
Admission: RE | Admit: 2023-05-28 | Discharge: 2023-05-28 | Disposition: A | Discharge status: Home / Self Care | Source: Ambulatory Visit | Attending: Nephrology | Admitting: Nephrology

## 2023-05-28 DIAGNOSIS — N184 Chronic kidney disease, stage 4 (severe): Secondary | ICD-10-CM

## 2023-06-01 ENCOUNTER — Ambulatory Visit: Attending: Family Medicine | Admitting: Family Medicine

## 2023-06-01 ENCOUNTER — Ambulatory Visit: Payer: Self-pay

## 2023-06-01 ENCOUNTER — Encounter: Payer: Self-pay | Admitting: Family Medicine

## 2023-06-01 VITALS — BP 150/92 | HR 88 | Temp 98.5°F | Resp 16 | Wt 250.0 lb

## 2023-06-01 DIAGNOSIS — N184 Chronic kidney disease, stage 4 (severe): Secondary | ICD-10-CM

## 2023-06-01 DIAGNOSIS — Z7985 Long-term (current) use of injectable non-insulin antidiabetic drugs: Secondary | ICD-10-CM | POA: Diagnosis not present

## 2023-06-01 DIAGNOSIS — E1165 Type 2 diabetes mellitus with hyperglycemia: Secondary | ICD-10-CM

## 2023-06-01 DIAGNOSIS — E1122 Type 2 diabetes mellitus with diabetic chronic kidney disease: Secondary | ICD-10-CM | POA: Diagnosis not present

## 2023-06-01 DIAGNOSIS — I129 Hypertensive chronic kidney disease with stage 1 through stage 4 chronic kidney disease, or unspecified chronic kidney disease: Secondary | ICD-10-CM

## 2023-06-01 DIAGNOSIS — M549 Dorsalgia, unspecified: Secondary | ICD-10-CM

## 2023-06-01 DIAGNOSIS — R600 Localized edema: Secondary | ICD-10-CM

## 2023-06-01 MED ORDER — OZEMPIC (0.25 OR 0.5 MG/DOSE) 2 MG/3ML ~~LOC~~ SOPN: 0.5000 mg | PEN_INJECTOR | SUBCUTANEOUS | 2 refills | Status: DC

## 2023-06-01 MED ORDER — FUROSEMIDE 20 MG PO TABS: 20.0000 mg | ORAL_TABLET | Freq: Every day | ORAL | 0 refills | Status: DC

## 2023-06-01 NOTE — Progress Notes (Signed)
 BLE edema Denies SOB or cough

## 2023-06-01 NOTE — Patient Instructions (Signed)
 VISIT SUMMARY:  During today's visit, we discussed your recent lower extremity swelling, back pain, diabetes management, and blood pressure. We reviewed your current medications and made some adjustments to help manage your symptoms and improve your overall health.  YOUR PLAN:  -CHRONIC KIDNEY DISEASE (CKD): Chronic Kidney Disease means your kidneys are not functioning well, currently at 22%. The swelling in your legs and feet is related to this condition. We have prescribed furosemide 20 mg once daily for one month to help manage the swelling. Please inform your kidney specialist about this new medication and follow up with them for further management.  -TYPE 2 DIABETES MELLITUS: Type 2 Diabetes means your body has difficulty regulating blood sugar levels. Your A1c is currently 9.7%, indicating poor control. We have adjusted your semaglutide dose to 0.5 mg weekly to help manage your blood sugar levels better and reduce side effects. Please monitor your blood sugar levels and report any low blood sugar episodes.  -HYPERTENSION: Hypertension means high blood pressure. Your blood pressure was elevated today, possibly due to your current condition and stress. The addition of furosemide may help control your blood pressure. Please monitor your blood pressure regularly and report any significant changes.  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 We recommend proper lifting techniques and ergonomic adjustments at work. If the pain persists, consider physical therapy.  INSTRUCTIONS:  Please follow up with your nephrologist as scheduled and with your endocrinologist on May 4-6. Return to our clinic for a follow-up visit in three months.

## 2023-06-01 NOTE — Progress Notes (Signed)
 Subjective:  Patient ID: Willie Spears, male    DOB: Jul 24, 1990  Age: 33 y.o. MRN: [id number]  CC: Leg Swelling     Discussed the use of AI scribe software for clinical note transcription with the patient, who gave verbal consent to proceed.  History of Present Illness The patient, with a history of hypertension, type 2 diabetes mellitus.  Stage IV CKD presents with recent onset of lower extremity edema. He reports that the swelling is from the knees down and has been present for about a week. He denies any associated shortness of breath or chest pain. He has been seeing a nephrologist .He reports having protein in his urine and a low vitamin D level. He has been advised to continue losing weight; however, he reports recent weight gain.  In addition to the edema, the patient also reports back pain, which he describes as a hurting sensation in the lower back. The pain comes and goes and is usually worse upon waking up. He denies any radiation of the pain into the legs. 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  Regarding his diabetes, his most recent A1c from February was 9.7. However, he reports that his average glucose levels have been improving, with a 14-day average of 152, a 30-day average of 141, and a 90-day average of 150. He has been prescribed Ozempic, but reports not taking it regularly due to side effects such as nausea and feeling "loopy." He reports that when he does take the Ozempic, his glucose levels drop to around 90-100.  He is currently under the care of the clinical pharmacist. His blood pressure is elevated and he endorses adherence with his antihypertensive.  He informs me that his systolic blood pressures are usually in the low 100s.    Past Medical History:  Diagnosis Date   AKI (acute kidney injury) (HCC) 06/18/2020   Diabetes mellitus without complication (HCC)    Hypertension    Scrotal abscess  05/01/2018    Past Surgical History:  Procedure Laterality Date   IRRIGATION AND DEBRIDEMENT ABSCESS N/A 05/02/2018   Procedure: IRRIGATION AND DEBRIDEMENT ABSCESS scrotal;  Surgeon: Rene Paci, MD;  Location: [facility];  Service: Urology;  Laterality: N/A;    Family History  Problem Relation Age of Onset   Diabetes Mother    Diabetes Father     Social History   Socioeconomic History   Marital status: Single    Spouse name: Not on file   Number of children: Not on file   Years of education: Not on file   Highest education level: Some college, no degree  Occupational History   Not on file  Tobacco Use   Smoking status: Former    Current packs/day: 0.00    Types: Cigarettes    Quit date: 06/13/2020    Years since quitting: 2.9   Smokeless tobacco: Never  Vaping Use   Vaping status: Never Used  Substance and Sexual Activity   Alcohol use: No   Drug use: Yes    Types: Marijuana   Sexual activity: Yes  Other Topics Concern   Not on file  Social History Narrative   Not on file   Social Drivers of Health   Financial Resource Strain: Medium Risk (04/06/2023)   Overall Financial Resource Strain (CARDIA)    Difficulty of Paying Living Expenses: Somewhat hard  Food Insecurity: Food Insecurity Present (04/06/2023)  Hunger Vital Sign    Worried About Running Out of Food in the Last Year: Never true    Ran Out of Food in the Last Year: Sometimes true  Transportation Needs: No Transportation Needs (04/06/2023)   PRAPARE - Administrator, Civil Service (Medical): No    Lack of Transportation (Non-Medical): No  Physical Activity: Insufficiently Active (04/06/2023)   Exercise Vital Sign    Days of Exercise per Week: 3 days    Minutes of Exercise per Session: 30 min  Stress: No Stress Concern Present (04/06/2023)   [employer] of Occupational Health - Occupational Stress Questionnaire    Feeling of Stress : Only a little  Social Connections: Moderately  Integrated (04/06/2023)   Social Connection and Isolation Panel [NHANES]    Frequency of Communication with Friends and Family: Three times a week    Frequency of Social Gatherings with Friends and Family: Once a week    Attends Religious Services: 1 to 4 times per year    Active Member of [employer] or Organizations: No    Attends Engineer, structural: Not on file    Marital Status: Living with partner    No Known Allergies  Outpatient Medications Prior to Visit  Medication Sig Dispense Refill   amLODipine (NORVASC) 10 MG tablet Take 1 tablet (10 mg total) by mouth daily. 90 tablet 1   atorvastatin (LIPITOR) 20 MG tablet Take 1 tablet (20 mg total) by mouth daily. 90 tablet 1   Blood Glucose Monitoring Suppl (TRUE METRIX METER) w/Device KIT Use to check blood sugar twice daily. 1 kit 0   Blood Pressure Monitoring (BLOOD PRESSURE KIT) DEVI Use to measure blood pressure 1 each 0   chlorthalidone (HYGROTON) 25 MG tablet Take 1 tablet (25 mg total) by mouth daily. 90 tablet 1   clotrimazole-betamethasone (LOTRISONE) cream Apply 1 Application topically daily. 30 g 0   empagliflozin (JARDIANCE) 25 MG TABS tablet Take 1 tablet (25 mg total) by mouth daily before breakfast. 90 tablet 1   glucose blood (TRUE METRIX BLOOD GLUCOSE TEST) test strip Use as instructed to check blood sugar twice daily. 100 each 2   methocarbamol (ROBAXIN) 500 MG tablet Take 1 tablet (500 mg total) by mouth 2 (two) times daily. 20 tablet 0   nystatin cream (MYCOSTATIN) Apply to affected area 2 times daily 30 g 0   pioglitazone (ACTOS) 30 MG tablet Take 1 tablet (30 mg total) by mouth daily. 90 tablet 1   traMADol (ULTRAM) 50 MG tablet Take 1 tablet (50 mg total) by mouth every 6 (six) hours as needed (pain). 12 tablet 0   Semaglutide, 1 MG/DOSE, 4 MG/3ML SOPN Inject 1 mg as directed once a week. 3 mL 3   No facility-administered medications prior to visit.     ROS Review of Systems  Constitutional:  Negative for  activity change and appetite change.  HENT:  Negative for sinus pressure and sore throat.   Respiratory:  Negative for chest tightness, shortness of breath and wheezing.   Cardiovascular:  Positive for leg swelling. Negative for chest pain and palpitations.  Gastrointestinal:  Negative for abdominal distention, abdominal pain and constipation.  Genitourinary: Negative.   Musculoskeletal:        See HPI  Psychiatric/Behavioral:  Negative for behavioral problems and dysphoric mood.     Objective:  BP (!) 150/92 (BP Location: Left Arm, Patient Position: Sitting, Cuff Size: Large)   Pulse 88   Temp 98.5 F (  36.9 C) (Oral)   Resp 16   Wt 250 lb (113.4 kg)   SpO2 98%   BMI 34.87 kg/m      06/01/2023    4:44 PM 06/01/2023    4:04 PM 05/11/2023    3:02 PM  BP/Weight  Systolic BP 150 161 118  Diastolic BP 92 98 78  Wt. (Lbs)  250 238  BMI  34.87 kg/m2 33.19 kg/m2      Physical Exam Constitutional:      Appearance: He is well-developed.  Cardiovascular:     Rate and Rhythm: Normal rate.     Heart sounds: Normal heart sounds. No murmur heard. Pulmonary:     Effort: Pulmonary effort is normal.     Breath sounds: Normal breath sounds. No wheezing or rales.  Chest:     Chest wall: No tenderness.  Abdominal:     General: Bowel sounds are normal. There is no distension.     Palpations: Abdomen is soft. There is no mass.     Tenderness: There is no abdominal tenderness.  Musculoskeletal:        General: Normal range of motion.     Right lower leg: Edema present.     Left lower leg: Edema present.  Neurological:     Mental Status: He is alert and oriented to person, place, and time.  Psychiatric:        Mood and Affect: Mood normal.        Latest Ref Rng & Units 04/06/2023    2:58 PM 03/10/2023    3:48 PM 06/19/2021   12:01 PM  CMP  Glucose 70 - 99 mg/dL 865  784  696   BUN 6 - 20 mg/dL 49  41  18   Creatinine 0.76 - 1.27 mg/dL 2.95  2.84  1.32   Sodium 134 - 144 mmol/L  142  139  138   Potassium 3.5 - 5.2 mmol/L 4.4  5.9  4.5   Chloride 96 - 106 mmol/L 103  102  103   CO2 20 - 29 mmol/L 19  23  21    Calcium 8.7 - 10.2 mg/dL 9.8  44.0  9.4   Total Protein 6.0 - 8.5 g/dL  6.9  6.2   Total Bilirubin 0.0 - 1.2 mg/dL  0.2  <1.0   Alkaline Phos 44 - 121 IU/L  92  94   AST 0 - 40 IU/L  12  15   ALT 0 - 44 IU/L  17  15     Lipid Panel     Component Value Date/Time   CHOL 257 (H) 03/10/2023 1548   TRIG 199 (H) 03/10/2023 1548   HDL 57 03/10/2023 1548   CHOLHDL 3.6 06/19/2021 1201   LDLCALC 164 (H) 03/10/2023 1548    CBC    Component Value Date/Time   WBC 8.1 06/19/2021 1201   WBC 7.5 06/18/2020 1056   RBC 5.98 (H) 06/19/2021 1201   RBC 5.88 (H) 06/18/2020 1056   HGB 14.5 06/19/2021 1201   HCT 45.8 06/19/2021 1201   PLT 382 06/19/2021 1201   MCV 77 (L) 06/19/2021 1201   MCH 24.2 (L) 06/19/2021 1201   MCH 23.8 (L) 06/18/2020 1056   MCHC 31.7 06/19/2021 1201   MCHC 32.4 06/18/2020 1056   RDW 13.1 06/19/2021 1201   LYMPHSABS 2.6 06/19/2021 1201   MONOABS 0.7 05/05/2018 0503   EOSABS 0.2 06/19/2021 1201   BASOSABS 0.1 06/19/2021 1201    Lab Results  Component Value Date   HGBA1C 9.7 (A) 04/06/2023       Assessment & Plan Stage 4 chronic Kidney Disease (CKD)  Swelling in legs and feet consistent with CKD.  In the setting of elevated blood pressure and CKD would love to place on [place] however he did have hyperkalemia and his labs prior to this last 1 so I will hold off on this.  Weight gain reported despite nephrologist's advice for weight management. Discussed diuretic use for swelling, acknowledging risk of worsening kidney function. Furosemide prescribed for short-term symptom management until nephrologist follow-up. - Prescribe furosemide 20 mg once daily for one month to manage swelling. - Inform nephrologist about furosemide use. - Advise follow-up with nephrologist for further management.  Type 2 Diabetes Mellitus A1c at 9.7%  indicates poor glycemic control. Reports improved blood glucose levels with an average of 155 mg/dL over 90 days. Non-compliant with semaglutide due to nausea typically occurs the next day.  Discussed adjusting semaglutide dose to 0.5 mg to mitigate side effects and improve compliance. Emphasized importance of reporting side effects to adjust other medications accordingly. -He actually talked about 'low' blood sugars with Ozempic 1 mg but on further questioning, this value is actually normal at 80, 90 and he has not experienced any hypoglycemia - Adjust semaglutide dose to 0.5 mg weekly. - Monitor blood glucose levels and report hypoglycemic episodes. -He has an upcoming appointment with the clinical pharmacist.  Hypertension Blood pressure elevated despite medication adherence.  Addition of furosemide may aid in blood pressure control. - Recheck blood pressure before leaving the clinic revealed blood pressure still elevated - Monitor blood pressure regularly and report significant changes.  Musculoskeletal Pain Intermittent musculoskeletal pain in the back, improving with stretching and activity. 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 - Advise on proper lifting techniques and ergonomic adjustments at work. - Consider physical therapy if pain persists.  Pedal edema He endorses ingestion of high sodium foods -Underlying CKD could also be contributory -Short course of Lasix and he will follow-up with [facility]  Follow-up Scheduled follow-up with nephrologist and future appointment with endocrinologist. - Follow up with nephrologist as scheduled. - Follow up with endocrinologist on May 4-6. - Return to the clinic for follow-up in three months.      Meds ordered this encounter  Medications   furosemide (LASIX) 20 MG tablet    Sig: Take 1 tablet (20 mg total) by mouth daily.    Dispense:  30 tablet    Refill:  0    Semaglutide,0.25 or 0.5MG /DOS, (OZEMPIC, 0.25 OR 0.5 MG/DOSE,) 2 MG/3ML SOPN    Sig: Inject 0.5 mg into the skin once a week.    Dispense:  3 mL    Refill:  2    Follow-up: Return in about 3 months (around 08/31/2023) for Chronic medical conditions.       Hoy Register, MD, FAAFP. [facility] [place], [place] [phone]   06/01/2023, 5:40 PM

## 2023-06-01 NOTE — Telephone Encounter (Signed)
 Summary: Swelling   Copied From CRM [id number]. Reason for Triage: Legs and feet are swelling at night         Chief Complaint: bilateral legs and feet swelling in evening after work; hx CKD Symptoms: see above Frequency: daily Pertinent Negatives: Patient denies cp, sob, calf pain Disposition: [] ED /[] Urgent Care (no appt availability in office) / [x] Appointment(In office/virtual)/ []  [facility]/ [] Home Care/ [] Refused Recommended Disposition /[] [facility]/ []  Follow-up with PCP Additional Notes: per protocol apt scheduled for today; care advice given, denies questions; instructed to go to ER if becomes worse.   Reason for Disposition  [1] MODERATE leg swelling (e.g., swelling extends up to knees) AND [2] new-onset or worsening  Answer Assessment - Initial Assessment Questions 1. ONSET: "When did the swelling start?" (e.g., minutes, hours, days)     Hx ckd; started last week 2. LOCATION: "What part of the leg is swollen?"  "Are both legs swollen or just one leg?"     Legs and feet bilaterally  3. SEVERITY: "How bad is the swelling?" (e.g., localized; mild, moderate, severe)   - Localized: Small area of swelling localized to one leg.   - MILD pedal edema: Swelling limited to foot and ankle, pitting edema < 1/4 inch (6 mm) deep, rest and elevation eliminate most or all swelling.   - MODERATE edema: Swelling of lower leg to knee, pitting edema > 1/4 inch (6 mm) deep, rest and elevation only partially reduce swelling.   - SEVERE edema: Swelling extends above knee, facial or hand swelling present.      No pitting 4. REDNESS: "Does the swelling look red or infected?"     denies 5. PAIN: "Is the swelling painful to touch?" If Yes, ask: "How painful is it?"   (Scale 1-10; mild, moderate or severe)     Pain in lower back on left side; 3/10 6. FEVER: "Do you have a fever?" If Yes, ask: "What is it, how was it measured, and when did it start?"      denies 7. CAUSE:  "What do you think is causing the leg swelling?"     unknown 8. MEDICAL HISTORY: "Do you have a history of blood clots (e.g., DVT), cancer, heart failure, kidney disease, or liver failure?"     Kidney disease 9. RECURRENT SYMPTOM: "Have you had leg swelling before?" If Yes, ask: "When was the last time?" "What happened that time?"     no 10. OTHER SYMPTOMS: "Do you have any other symptoms?" (e.g., chest pain, difficulty breathing)       denies 11. PREGNANCY: "Is there any chance you are pregnant?" "When was your last menstrual period?"       no  Protocols used: Leg Swelling and Edema-A-AH

## 2023-06-02 ENCOUNTER — Encounter: Payer: Self-pay | Admitting: Family Medicine

## 2023-06-02 LAB — PRO B NATRIURETIC PEPTIDE: NT-Pro BNP: 161 pg/mL — ABNORMAL HIGH (ref 0–86)

## 2023-06-15 ENCOUNTER — Telehealth: Payer: Self-pay | Admitting: Family Medicine

## 2023-06-15 ENCOUNTER — Other Ambulatory Visit: Payer: Self-pay | Admitting: Family Medicine

## 2023-06-15 NOTE — Telephone Encounter (Signed)
 Copied from CRM [id number]. Topic: Clinical - Medication Refill >> Jun 15, 2023 11:50 AM Willie Spears T wrote: Most Recent Primary Care Visit:  Provider: Joaquin Mulberry  Department: [facility]  Visit Type: ACUTE  Date: 06/01/2023  Medication: empagliflozin (JARDIANCE) 25 MG TABS tablet, pioglitazone (ACTOS) 30 MG tablet, amLODipine (NORVASC) 10 MG tablet and chlorthalidone (HYGROTON) 25 MG tablet    Has the patient contacted their pharmacy? No  Is this the correct pharmacy for this prescription? Yes If no, delete pharmacy and type the correct one.  This is the patient's preferred pharmacy:  [facility] [address] [place] [facility] [address] [place] [address] Phone: [phone] Fax: [phone]  Has the prescription been filled recently? Yes  Is the patient out of the medication? Yes  Has the patient been seen for an appointment in the last year OR does the patient have an upcoming appointment? Yes  Can we respond through MyChart? No  Agent: Please be advised that Rx refills may take up to 3 business days. We ask that you follow-up with your pharmacy.

## 2023-06-15 NOTE — Telephone Encounter (Signed)
 Left HIPAA-compliant VM with instructions to come by a day I'm in clinic. I'll be happy to give him the sample sensors he needs.

## 2023-06-15 NOTE — Telephone Encounter (Signed)
 Please Advise.  Copied from CRM [id number]. Topic: General - Other  >> Jun 15, 2023 11:57 AM Loreda Rodriguez T wrote:  Reason for CRM: patient called stated he needed [place] to call him back as he needs his [employer] that he normally get through the program

## 2023-06-16 MED ORDER — CHLORTHALIDONE 25 MG PO TABS: 25.0000 mg | ORAL_TABLET | Freq: Every day | ORAL | 1 refills | Status: AC

## 2023-06-16 MED ORDER — PIOGLITAZONE HCL 30 MG PO TABS: 30.0000 mg | ORAL_TABLET | Freq: Every day | ORAL | 1 refills | Status: AC

## 2023-06-16 MED ORDER — AMLODIPINE BESYLATE 10 MG PO TABS: 10.0000 mg | ORAL_TABLET | Freq: Every day | ORAL | 1 refills | Status: DC

## 2023-06-16 MED ORDER — FUROSEMIDE 20 MG PO TABS
20.0000 mg | ORAL_TABLET | Freq: Every day | ORAL | 0 refills | Status: DC
Start: 2023-06-16 — End: 2023-06-22

## 2023-06-16 MED ORDER — EMPAGLIFLOZIN 25 MG PO TABS: 25.0000 mg | ORAL_TABLET | Freq: Every day | ORAL | 1 refills | Status: DC

## 2023-06-16 NOTE — Telephone Encounter (Signed)
 Requested Prescriptions  Pending Prescriptions Disp Refills   amLODipine (NORVASC) 10 MG tablet 90 tablet 1    Sig: Take 1 tablet (10 mg total) by mouth daily.     Cardiovascular: Calcium Channel Blockers 2 Failed - 06/16/2023  1:22 PM      Failed - Last BP in normal range    BP Readings from Last 1 Encounters:  06/01/23 (!) 150/92         Passed - Last Heart Rate in normal range    Pulse Readings from Last 1 Encounters:  06/01/23 88         Passed - Valid encounter within last 6 months    Recent Outpatient Visits           2 weeks ago Pedal edema   [facility] - [facility] Dept Of [place]. [facility] Hoy Register, MD   1 month ago Type 2 diabetes mellitus with hyperglycemia, without long-term current use of insulin (HCC)   [facility] [place] - A Dept Of [place]. [facility] Lois Huxley, [place] L, RPH-CPP   2 months ago Type 2 diabetes mellitus with hyperglycemia, without long-term current use of insulin (HCC)   [facility] - A Dept Of [place]. [facility] Lois Huxley, [place] L, RPH-CPP   3 months ago Type 2 diabetes mellitus with hyperglycemia, without long-term current use of insulin (HCC)   [facility] - A Dept Of [place]. [facility] Hoy Register, MD   1 year ago Type 2 diabetes mellitus with hyperglycemia, without long-term current use of insulin (HCC)   [facility] - A Dept Of [place]. [facility] Storm Frisk, MD       Future Appointments             In 2 weeks Lois Huxley, Cornelius Moras, RPH-CPP [facility] [place] - A Dept Of Eligh[place] Bridegroom. [facility]             chlorthalidone (HYGROTON) 25 MG tablet 90 tablet 1    Sig: Take 1 tablet (25 mg total) by mouth daily.     Cardiovascular: Diuretics - Thiazide Failed - 06/16/2023  1:22 PM      Failed - Last BP in normal range    BP Readings from Last 1  Encounters:  06/01/23 (!) 150/92         Passed - Cr in normal range and within 180 days    Creat  Date Value Ref Range Status  03/28/2015 1.10 0.60 - 1.35 mg/dL Final   Creatinine, Ser  Date Value Ref Range Status  04/06/2023 3.49 (H) 0.76 - 1.27 mg/dL Final   Creatinine, POC  Date Value Ref Range Status  05/15/2023 175.8 mg/dL Final    Comment:    Abstracted by HIM         Passed - K in normal range and within 180 days    Potassium  Date Value Ref Range Status  04/06/2023 4.4 3.5 - 5.2 mmol/L Final         Passed - Na in normal range and within 180 days    Sodium  Date Value Ref Range Status  04/06/2023 142 134 - 144 mmol/L Final         Passed - Valid encounter within last 6 months    Recent Outpatient Visits  2 weeks ago Pedal edema   [facility] - A Dept Of [place]. [facility] Hoy Register, MD   1 month ago Type 2 diabetes mellitus with hyperglycemia, without long-term current use of insulin (HCC)   [facility] [place] - A Dept Of [place]. [facility] Lois Huxley, [place] L, RPH-CPP   2 months ago Type 2 diabetes mellitus with hyperglycemia, without long-term current use of insulin (HCC)   [facility] - A Dept Of [place]. [facility] Lois Huxley, [place] L, RPH-CPP   3 months ago Type 2 diabetes mellitus with hyperglycemia, without long-term current use of insulin (HCC)   [facility] - A Dept Of [place]. [facility] Hoy Register, MD   1 year ago Type 2 diabetes mellitus with hyperglycemia, without long-term current use of insulin (HCC)   [facility] [facility] - A Dept Of [place]. [facility] Storm Frisk, MD       Future Appointments             In 2 weeks Lois Huxley, Cornelius Moras, RPH-CPP [place] [facility] [place] - A Dept Of [place] Bridegroom. [facility]             empagliflozin  (JARDIANCE) 25 MG TABS tablet 90 tablet 1    Sig: Take 1 tablet (25 mg total) by mouth daily before breakfast.     Endocrinology:  Diabetes - SGLT2 Inhibitors Failed - 06/16/2023  1:22 PM      Failed - HBA1C is between 0 and 7.9 and within 180 days    HbA1c, POC (controlled diabetic range)  Date Value Ref Range Status  04/06/2023 9.7 (A) 0.0 - 7.0 % Final         Passed - Cr in normal range and within 360 days    Creat  Date Value Ref Range Status  03/28/2015 1.10 0.60 - 1.35 mg/dL Final   Creatinine, Ser  Date Value Ref Range Status  04/06/2023 3.49 (H) 0.76 - 1.27 mg/dL Final   Creatinine, POC  Date Value Ref Range Status  05/15/2023 175.8 mg/dL Final    Comment:    Abstracted by HIM         Passed - eGFR in normal range and within 360 days    GFR, Est African American  Date Value Ref Range Status  03/28/2015 >89 >=60 mL/min Final   GFR calc Af Amer  Date Value Ref Range Status  12/30/2019 84 >59 mL/min/1.73 Final    Comment:    **In accordance with recommendations from the NKF-ASN Task force,**   [facility] is in the process of updating its eGFR calculation to the   2021 CKD-EPI creatinine equation that estimates kidney function   without a race variable.    GFR, Est Non African American  Date Value Ref Range Status  03/28/2015 >89 >=60 mL/min Final    Comment:      The estimated GFR is a calculation valid for adults (>=6 years old) that uses the CKD-EPI algorithm to adjust for age and sex. It is   not to be used for children, pregnant women, hospitalized patients,    patients on dialysis, or with rapidly changing kidney function. According to the NKDEP, eGFR >89 is normal, 60-89 shows mild impairment, 30-59 shows moderate impairment, 15-29 shows severe impairment and <15 is ESRD.  GFR, Estimated  Date Value Ref Range Status  06/18/2020 58 (L) >60 mL/min Final    Comment:    (NOTE) Calculated using the CKD-EPI Creatinine Equation (2021)    eGFR   Date Value Ref Range Status  04/06/2023 23 (L) >59 mL/min/1.73 Final   EGFR  Date Value Ref Range Status  05/15/2023 22.0  Final    Comment:    Abstracted by HIM         Passed - Valid encounter within last 6 months    Recent Outpatient Visits           2 weeks ago Pedal edema   [facility] [place] - A Dept Of [place]. [facility] Joaquin Mulberry, MD   1 month ago Type 2 diabetes mellitus with hyperglycemia, without long-term current use of insulin (HCC)   [facility] [place] - A Dept Of [place]. [facility] Freada Jacobs, [place] L, RPH-CPP   2 months ago Type 2 diabetes mellitus with hyperglycemia, without long-term current use of insulin (HCC)   [facility] - A Dept Of [place]. [facility] Freada Jacobs, [place] L, RPH-CPP   3 months ago Type 2 diabetes mellitus with hyperglycemia, without long-term current use of insulin (HCC)   [facility] - A Dept Of [place]. [facility] Joaquin Mulberry, MD   1 year ago Type 2 diabetes mellitus with hyperglycemia, without long-term current use of insulin (HCC)   [facility] - A Dept Of [place]. [facility] Vernell Goldsmith, MD       Future Appointments             In 2 weeks Freada Jacobs, Jonathon Neighbors, RPH-CPP [facility] - A Dept Of [place]ag[place]. [facility]             furosemide (LASIX) 20 MG tablet 30 tablet 0    Sig: Take 1 tablet (20 mg total) by mouth daily.     Cardiovascular:  Diuretics - Loop Failed - 06/16/2023  1:22 PM      Failed - Mg Level in normal range and within 180 days    No results found for: "MG"       Failed - Last BP in normal range    BP Readings from Last 1 Encounters:  06/01/23 (!) 150/92         Passed - K in normal range and within 180 days    Potassium  Date Value Ref Range Status  04/06/2023 4.4 3.5 - 5.2 mmol/L Final         Passed -  Ca in normal range and within 180 days    Calcium  Date Value Ref Range Status  04/06/2023 9.8 8.7 - 10.2 mg/dL Final         Passed - Na in normal range and within 180 days    Sodium  Date Value Ref Range Status  04/06/2023 142 134 - 144 mmol/L Final         Passed - Cr in normal range and within 180 days    Creat  Date Value Ref Range Status  03/28/2015 1.10 0.60 - 1.35 mg/dL Final   Creatinine, Ser  Date Value Ref Range Status  04/06/2023 3.49 (H) 0.76 - 1.27 mg/dL Final   Creatinine, POC  Date Value Ref Range Status  05/15/2023 175.8 mg/dL Final  Comment:    Abstracted by HIM         Passed - Cl in normal range and within 180 days    Chloride  Date Value Ref Range Status  04/06/2023 103 96 - 106 mmol/L Final         Passed - Valid encounter within last 6 months    Recent Outpatient Visits           2 weeks ago Pedal edema   [facility] [place] - A Dept Of [place]. [facility] Hoy Register, MD   1 month ago Type 2 diabetes mellitus with hyperglycemia, without long-term current use of insulin (HCC)   [facility] [place] - A Dept Of [place]. [facility] Lois Huxley, [place] L, RPH-CPP   2 months ago Type 2 diabetes mellitus with hyperglycemia, without long-term current use of insulin (HCC)   [facility] - A Dept Of [place]. [facility] Lois Huxley, [place] L, RPH-CPP   3 months ago Type 2 diabetes mellitus with hyperglycemia, without long-term current use of insulin (HCC)   [facility] - A Dept Of [place]. [facility] Hoy Register, MD   1 year ago Type 2 diabetes mellitus with hyperglycemia, without long-term current use of insulin (HCC)   [facility] - A Dept Of [place]. [facility] Storm Frisk, MD       Future Appointments             In 2 weeks Lois Huxley, Cornelius Moras, RPH-CPP [facility] [place] -  A Dept Of Eligha Bridegroom. [facility]             pioglitazone (ACTOS) 30 MG tablet 90 tablet 1    Sig: Take 1 tablet (30 mg total) by mouth daily.     Endocrinology:  Diabetes - Glitazones - pioglitazone Failed - 06/16/2023  1:22 PM      Failed - HBA1C is between 0 and 7.9 and within 180 days    HbA1c, POC (controlled diabetic range)  Date Value Ref Range Status  04/06/2023 9.7 (A) 0.0 - 7.0 % Final         Passed - Valid encounter within last 6 months    Recent Outpatient Visits           2 weeks ago Pedal edema   [facility] [place] - A Dept Of [place]. [facility] Hoy Register, MD   1 month ago Type 2 diabetes mellitus with hyperglycemia, without long-term current use of insulin (HCC)   [facility] [place] - A Dept Of [place]. [facility] Lois Huxley, [place] L, RPH-CPP   2 months ago Type 2 diabetes mellitus with hyperglycemia, without long-term current use of insulin (HCC)   [facility] [facility] - A Dept Of [place]. [facility] Lois Huxley, [place] L, RPH-CPP   3 months ago Type 2 diabetes mellitus with hyperglycemia, without long-term current use of insulin (HCC)   [place] [facility] - A Dept Of [place]. [facility] Hoy Register, MD   1 year ago Type 2 diabetes mellitus with hyperglycemia, without long-term current use of insulin (HCC)   [place] [facility] - A Dept Of [place]. [facility] Storm Frisk, MD  Future Appointments             In 2 weeks Freada Jacobs, Jonathon Neighbors, RPH-CPP [place] [facility] [place] - A Dept Of [place]. [facility]

## 2023-06-22 ENCOUNTER — Ambulatory Visit: Attending: Family Medicine | Admitting: Family Medicine

## 2023-06-22 ENCOUNTER — Encounter: Payer: Self-pay | Admitting: Family Medicine

## 2023-06-22 VITALS — BP 132/85 | HR 88 | Resp 16 | Wt 250.0 lb

## 2023-06-22 DIAGNOSIS — E1122 Type 2 diabetes mellitus with diabetic chronic kidney disease: Secondary | ICD-10-CM

## 2023-06-22 DIAGNOSIS — N529 Male erectile dysfunction, unspecified: Secondary | ICD-10-CM

## 2023-06-22 DIAGNOSIS — R600 Localized edema: Secondary | ICD-10-CM

## 2023-06-22 DIAGNOSIS — I129 Hypertensive chronic kidney disease with stage 1 through stage 4 chronic kidney disease, or unspecified chronic kidney disease: Secondary | ICD-10-CM

## 2023-06-22 DIAGNOSIS — Z7984 Long term (current) use of oral hypoglycemic drugs: Secondary | ICD-10-CM

## 2023-06-22 DIAGNOSIS — E1165 Type 2 diabetes mellitus with hyperglycemia: Secondary | ICD-10-CM

## 2023-06-22 DIAGNOSIS — N481 Balanitis: Secondary | ICD-10-CM

## 2023-06-22 DIAGNOSIS — N184 Chronic kidney disease, stage 4 (severe): Secondary | ICD-10-CM | POA: Diagnosis not present

## 2023-06-22 MED ORDER — SEMAGLUTIDE (1 MG/DOSE) 4 MG/3ML ~~LOC~~ SOPN: 1.0000 mg | PEN_INJECTOR | SUBCUTANEOUS | 3 refills | Status: DC

## 2023-06-22 MED ORDER — FUROSEMIDE 20 MG PO TABS: 20.0000 mg | ORAL_TABLET | Freq: Every day | ORAL | 1 refills | Status: DC

## 2023-06-22 MED ORDER — SILDENAFIL CITRATE 100 MG PO TABS: 100.0000 mg | ORAL_TABLET | Freq: Every day | ORAL | 1 refills | Status: DC | PRN

## 2023-06-22 NOTE — Patient Instructions (Signed)
 VISIT SUMMARY:  During today's visit, we discussed your ongoing issues with leg swelling, a rash, diabetes management, hypertension, and erectile dysfunction. We reviewed your current medications and made some adjustments to better manage your conditions.  YOUR PLAN:  -TYPE 2 DIABETES MELLITUS: Type 2 diabetes is a condition where your body does not use insulin  properly, leading to high blood sugar levels. We will increase your semaglutide  dose to 1 mg weekly and continue your current dose of pioglitazone . If you experience low blood sugar after the semaglutide  dose increase, we may need to adjust your pioglitazone  dose.  -RASH DUE TO JARDIANCE : The rash you experienced is likely a yeast infection caused by empagliflozin  (Jardiance ). We have discontinued Jardiance  and are considering switching to Kerendia for kidney protection, pending approval from your nephrologist and insurance. We will check your potassium levels before starting Kerendia to avoid high potassium levels.  -HYPERTENSION: Hypertension, or high blood pressure, is being managed with amlodipine  and chlorthalidone . You are also using beetroot powder to help control your blood pressure. Continue taking amlodipine  10 mg daily, chlorthalidone  25 mg daily, and using beetroot powder. Additionally, try to reduce your salt intake and consider using Himalayan salt as an alternative.  -LEG SWELLING: Leg swelling is being managed with furosemide , a diuretic that helps reduce fluid buildup. We will refill your furosemide  prescription and advise you to continue elevating your legs during sleep to help reduce the swelling.  -ERECTILE DYSFUNCTION: Erectile dysfunction, which can be related to diabetes, affects your ability to maintain an erection. We will prescribe Viagra  to help with this issue and [facility]  INSTRUCTIONS:  Please follow up with your nephrologist regarding the potential switch to Kerendia  and ensure your potassium levels are checked before starting the new medication. Continue monitoring your blood sugar levels, especially after increasing your semaglutide  dose, and report any instances of low blood sugar. Maintain your current blood pressure management routine and dietary adjustments. If you have any concerns or experience any side effects, please contact our office.

## 2023-06-22 NOTE — Progress Notes (Signed)
 Swelling in legs is better.

## 2023-06-22 NOTE — Progress Notes (Signed)
 Subjective:  Patient ID: Willie Spears, male    DOB: 1990/03/06  Age: 33 y.o. MRN: [id number]  CC: Edema     Discussed the use of AI scribe software for clinical note transcription with the patient, who gave verbal consent to proceed.  History of Present Illness The patient, with a history of hypertension, type 2 diabetes mellitus.  Stage IV CKD presents for follow-up of  pedal edema.  He was previously prescribed furosemide  for the leg swelling and has an upcoming appointment with his nephrologist.  Pedal edema has improved with his furosemide .  He reports that the nephrologist recommended 'discontinuing his cholesterol medication' and restarting Jardiance , a medication he had previously stopped due to a rash on is penis. The rash, which he describes as severe and long-lasting, was initially thought to be a yeast infection. He has been treating the rash with an antifungal, but reports that it takes a long time to heal.  In addition to the rash, the patient reports a decrease in sexual function, which he attributes to his diabetes. He requests a prescription for Viagra .   He also reports missing a dose of Ozempic , a medication for his diabetes, and expresses concern about his diet and exercise habits. He has not restarted Jardiance  due to the side effects.  He has adjusted his diet and is trying to eat a low-sodium diet and limiting fast foods.  He plans on exercising more.    Past Medical History:  Diagnosis Date   AKI (acute kidney injury) (HCC) 06/18/2020   Anemia    Diabetes mellitus without complication (HCC)    Hypertension    Scrotal abscess 05/01/2018    Past Surgical History:  Procedure Laterality Date   IRRIGATION AND DEBRIDEMENT ABSCESS N/A 05/02/2018   Procedure: IRRIGATION AND DEBRIDEMENT ABSCESS scrotal;  Surgeon: Adelbert Homans, MD;  Location: [facility];  Service: Urology;  Laterality: N/A;    Family History  Problem Relation Age of Onset   Diabetes Mother     Diabetes Father     Social History   Socioeconomic History   Marital status: Single    Spouse name: Not on file   Number of children: Not on file   Years of education: Not on file   Highest education level: Some college, no degree  Occupational History   Not on file  Tobacco Use   Smoking status: Former    Current packs/day: 0.00    Types: Cigarettes    Quit date: 06/13/2020    Years since quitting: 3.0   Smokeless tobacco: Never  Vaping Use   Vaping status: Never Used  Substance and Sexual Activity   Alcohol use: No   Drug use: Yes    Types: Marijuana   Sexual activity: Yes  Other Topics Concern   Not on file  Social History Narrative   Not on file   Social Drivers of Health   Financial Resource Strain: Medium Risk (04/06/2023)   Overall Financial Resource Strain (CARDIA)    Difficulty of Paying Living Expenses: Somewhat hard  Food Insecurity: Food Insecurity Present (04/06/2023)   Hunger Vital Sign    Worried About Running Out of Food in the Last Year: Never true    Ran Out of Food in the Last Year: Sometimes true  Transportation Needs: No Transportation Needs (04/06/2023)   PRAPARE - Administrator, Civil Service (Medical): No    Lack of Transportation (Non-Medical): No  Physical Activity: Insufficiently Active (04/06/2023)  Exercise Vital Sign    Days of Exercise per Week: 3 days    Minutes of Exercise per Session: 30 min  Stress: No Stress Concern Present (04/06/2023)   [employer] of Occupational Health - Occupational Stress Questionnaire    Feeling of Stress : Only a little  Social Connections: Moderately Integrated (04/06/2023)   Social Connection and Isolation Panel [NHANES]    Frequency of Communication with Friends and Family: Three times a week    Frequency of Social Gatherings with Friends and Family: Once a week    Attends Religious Services: 1 to 4 times per year    Active Member of [employer] or Organizations: No    Attends Museum/gallery exhibitions officer: Not on file    Marital Status: Living with partner    No Known Allergies  Outpatient Medications Prior to Visit  Medication Sig Dispense Refill   amLODipine  (NORVASC ) 10 MG tablet Take 1 tablet (10 mg total) by mouth daily. 90 tablet 1   atorvastatin  (LIPITOR) 20 MG tablet Take 1 tablet (20 mg total) by mouth daily. 90 tablet 1   Blood Glucose Monitoring Suppl (TRUE METRIX METER) w/Device KIT Use to check blood sugar twice daily. 1 kit 0   Blood Pressure Monitoring (BLOOD PRESSURE KIT) DEVI Use to measure blood pressure 1 each 0   chlorthalidone  (HYGROTON ) 25 MG tablet Take 1 tablet (25 mg total) by mouth daily. 90 tablet 1   clotrimazole -betamethasone  (LOTRISONE ) cream Apply 1 Application topically daily. 30 g 0   empagliflozin  (JARDIANCE ) 25 MG TABS tablet Take 1 tablet (25 mg total) by mouth daily before breakfast. 90 tablet 1   furosemide  (LASIX ) 20 MG tablet Take 1 tablet (20 mg total) by mouth daily. 30 tablet 0   glucose blood (TRUE METRIX BLOOD GLUCOSE TEST) test strip Use as instructed to check blood sugar twice daily. 100 each 2   methocarbamol  (ROBAXIN ) 500 MG tablet Take 1 tablet (500 mg total) by mouth 2 (two) times daily. 20 tablet 0   nystatin  cream (MYCOSTATIN ) Apply to affected area 2 times daily 30 g 0   pioglitazone  (ACTOS ) 30 MG tablet Take 1 tablet (30 mg total) by mouth daily. 90 tablet 1   Semaglutide ,0.25 or 0.5MG /DOS, (OZEMPIC , 0.25 OR 0.5 MG/DOSE,) 2 MG/3ML SOPN Inject 0.5 mg into the skin once a week. 3 mL 2   traMADol  (ULTRAM ) 50 MG tablet Take 1 tablet (50 mg total) by mouth every 6 (six) hours as needed (pain). 12 tablet 0   No facility-administered medications prior to visit.     ROS Review of Systems  Constitutional:  Negative for activity change and appetite change.  HENT:  Negative for sinus pressure and sore throat.   Respiratory:  Negative for chest tightness, shortness of breath and wheezing.   Cardiovascular:  Negative for  chest pain and palpitations.  Gastrointestinal:  Negative for abdominal distention, abdominal pain and constipation.  Genitourinary: Negative.   Musculoskeletal: Negative.   Psychiatric/Behavioral:  Negative for behavioral problems and dysphoric mood.     Objective:  BP 132/85 (BP Location: Right Arm, Patient Position: Sitting, Cuff Size: Large)   Pulse 88   Resp 16   Wt 250 lb (113.4 kg)   SpO2 98%   BMI 34.87 kg/m      06/22/2023    3:00 PM 06/22/2023    2:52 PM 06/01/2023    4:44 PM  BP/Weight  Systolic BP 132 148 150  Diastolic BP 85 90 92  Wt. (  Lbs)  250   BMI  34.87 kg/m2       Physical Exam Constitutional:      Appearance: He is well-developed.  Cardiovascular:     Rate and Rhythm: Normal rate.     Heart sounds: Normal heart sounds. No murmur heard. Pulmonary:     Effort: Pulmonary effort is normal.     Breath sounds: Normal breath sounds. No wheezing or rales.  Chest:     Chest wall: No tenderness.  Abdominal:     General: Bowel sounds are normal. There is no distension.     Palpations: Abdomen is soft. There is no mass.     Tenderness: There is no abdominal tenderness.  Musculoskeletal:        General: Normal range of motion.     Right lower leg: No edema.     Left lower leg: No edema.  Neurological:     Mental Status: He is alert and oriented to person, place, and time.  Psychiatric:        Mood and Affect: Mood normal.        Latest Ref Rng & Units 04/06/2023    2:58 PM 03/10/2023    3:48 PM 06/19/2021   12:01 PM  CMP  Glucose 70 - 99 mg/dL 409  811  914   BUN 6 - 20 mg/dL 49  41  18   Creatinine 0.76 - 1.27 mg/dL 7.82  9.56  2.13   Sodium 134 - 144 mmol/L 142  139  138   Potassium 3.5 - 5.2 mmol/L 4.4  5.9  4.5   Chloride 96 - 106 mmol/L 103  102  103   CO2 20 - 29 mmol/L 19  23  21    Calcium  8.7 - 10.2 mg/dL 9.8  08.6  9.4   Total Protein 6.0 - 8.5 g/dL  6.9  6.2   Total Bilirubin 0.0 - 1.2 mg/dL  0.2  <5.7   Alkaline Phos 44 - 121 IU/L  92   94   AST 0 - 40 IU/L  12  15   ALT 0 - 44 IU/L  17  15     Lipid Panel     Component Value Date/Time   CHOL 257 (H) 03/10/2023 1548   TRIG 199 (H) 03/10/2023 1548   HDL 57 03/10/2023 1548   CHOLHDL 3.6 06/19/2021 1201   LDLCALC 164 (H) 03/10/2023 1548    CBC    Component Value Date/Time   WBC 8.1 06/19/2021 1201   WBC 7.5 06/18/2020 1056   RBC 5.98 (H) 06/19/2021 1201   RBC 5.88 (H) 06/18/2020 1056   HGB 14.5 06/19/2021 1201   HCT 45.8 06/19/2021 1201   PLT 382 06/19/2021 1201   MCV 77 (L) 06/19/2021 1201   MCH 24.2 (L) 06/19/2021 1201   MCH 23.8 (L) 06/18/2020 1056   MCHC 31.7 06/19/2021 1201   MCHC 32.4 06/18/2020 1056   RDW 13.1 06/19/2021 1201   LYMPHSABS 2.6 06/19/2021 1201   MONOABS 0.7 05/05/2018 0503   EOSABS 0.2 06/19/2021 1201   BASOSABS 0.1 06/19/2021 1201    Lab Results  Component Value Date   HGBA1C 9.7 (A) 04/06/2023       Assessment & Plan Type 2 Diabetes Mellitus Managed with semaglutide  and pioglitazone . Blood glucose stable with semaglutide , increases when dose missed. Empagliflozin  discontinued due to rash. - Increase semaglutide  to 1 mg subcutaneous weekly. - Continue pioglitazone  30 mg oral daily. - Adjust pioglitazone  dose if hypoglycemia  occurs after semaglutide  dose increase. -Counseled on Diabetic diet, my plate method, 027 minutes of moderate intensity exercise/week Blood sugar logs with fasting goals of 80-120 mg/dl, random of less than 253 and in the event of sugars less than 60 mg/dl or greater than 664 mg/dl encouraged to notify the clinic. Advised on the need for annual eye exams, annual foot exams, Pneumonia vaccine.   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  -Empagliflozin  discontinued. Kerendia considered for renal protection.   Hypertension Managed with amlodipine  and chlorthalidone . Uses beetroot powder for blood pressure control. - Continue amlodipine  10 mg oral daily. - Continue  chlorthalidone  25 mg oral daily. -Counseled on blood pressure goal of less than 130/80, low-sodium, DASH diet, medication compliance, 150 minutes of moderate intensity exercise per week. Discussed medication compliance, adverse effects.   Pedal edema Managed with furosemide . Improvement with leg elevation during sleep. - Refill furosemide  prescription. - Advise continued leg elevation during sleep.   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 He requests treatment. - Prescribe Viagra . - [facility].      No orders of the defined types were placed in this encounter.   Follow-up: No follow-ups on file.       Joaquin Mulberry, MD, FAAFP. [facility] [place], [place] [phone]   06/22/2023, 3:18 PM

## 2023-06-23 ENCOUNTER — Encounter: Payer: Self-pay | Admitting: Family Medicine

## 2023-06-23 ENCOUNTER — Other Ambulatory Visit: Payer: Self-pay

## 2023-06-23 LAB — CMP14+EGFR
ALT: 19 IU/L (ref 0–44)
AST: 21 IU/L (ref 0–40)
Albumin: 4 g/dL — ABNORMAL LOW (ref 4.1–5.1)
Alkaline Phosphatase: 79 IU/L (ref 44–121)
BUN/Creatinine Ratio: 18 (ref 9–20)
BUN: 58 mg/dL — ABNORMAL HIGH (ref 6–20)
Bilirubin Total: 0.3 mg/dL (ref 0.0–1.2)
CO2: 18 mmol/L — ABNORMAL LOW (ref 20–29)
Calcium: 9.4 mg/dL (ref 8.7–10.2)
Chloride: 106 mmol/L (ref 96–106)
Creatinine, Ser: 3.28 mg/dL — ABNORMAL HIGH (ref 0.76–1.27)
Globulin, Total: 2.5 g/dL (ref 1.5–4.5)
Glucose: 154 mg/dL — ABNORMAL HIGH (ref 70–99)
Potassium: 4.4 mmol/L (ref 3.5–5.2)
Sodium: 138 mmol/L (ref 134–144)
Total Protein: 6.5 g/dL (ref 6.0–8.5)
eGFR: 25 mL/min/{1.73_m2} — ABNORMAL LOW (ref >59)

## 2023-06-26 ENCOUNTER — Other Ambulatory Visit: Payer: Self-pay

## 2023-06-26 ENCOUNTER — Telehealth: Payer: Self-pay | Admitting: Family Medicine

## 2023-06-26 MED ORDER — SILDENAFIL CITRATE 100 MG PO TABS
100.0000 mg | ORAL_TABLET | Freq: Every day | ORAL | 1 refills | Status: DC | PRN
  Filled 2023-06-26: qty 10, 10d supply, fill #0
  Filled 2023-08-04: qty 10, 10d supply, fill #1

## 2023-06-26 NOTE — Telephone Encounter (Signed)
 Copied from CRM [id number]. Topic: Clinical - Prescription Issue  >> Jun 26, 2023 12:48 PM Crispin Dolphin wrote:  Reason for CRM: Patient called states Rx for sildenafil  (VIAGRA ) 100 MG [facility] but should have been [facility] to [facility]. Would like to have that changed. Thank You

## 2023-06-26 NOTE — Telephone Encounter (Signed)
 Patient has been informed of pharmacy change.

## 2023-07-01 ENCOUNTER — Other Ambulatory Visit: Payer: Self-pay

## 2023-07-03 ENCOUNTER — Ambulatory Visit: Admitting: Pharmacist

## 2023-07-30 ENCOUNTER — Ambulatory Visit: Attending: Family Medicine | Admitting: Pharmacist

## 2023-07-30 ENCOUNTER — Other Ambulatory Visit: Payer: Self-pay

## 2023-07-30 ENCOUNTER — Encounter: Payer: Self-pay | Admitting: Pharmacist

## 2023-07-30 DIAGNOSIS — Z7984 Long term (current) use of oral hypoglycemic drugs: Secondary | ICD-10-CM

## 2023-07-30 DIAGNOSIS — E1165 Type 2 diabetes mellitus with hyperglycemia: Secondary | ICD-10-CM

## 2023-07-30 DIAGNOSIS — Z7985 Long-term (current) use of injectable non-insulin antidiabetic drugs: Secondary | ICD-10-CM

## 2023-07-30 LAB — POCT GLYCOSYLATED HEMOGLOBIN (HGB A1C): HbA1c, POC (controlled diabetic range): 7.4 % — AB (ref 0.0–7.0)

## 2023-07-30 MED ORDER — AMLODIPINE BESYLATE 10 MG PO TABS
10.0000 mg | ORAL_TABLET | Freq: Every day | ORAL | 1 refills | Status: DC
  Filled 2023-07-30 – 2023-08-31 (×2): qty 90, 90d supply, fill #0
  Filled 2023-12-03: qty 90, 90d supply, fill #1

## 2023-07-30 MED ORDER — SEMAGLUTIDE (2 MG/DOSE) 8 MG/3ML ~~LOC~~ SOPN
2.0000 mg | PEN_INJECTOR | SUBCUTANEOUS | 1 refills | Status: DC
  Filled 2023-07-30 – 2023-09-25 (×2): qty 9, 84d supply, fill #0

## 2023-07-30 MED ORDER — SEMAGLUTIDE (1 MG/DOSE) 4 MG/3ML ~~LOC~~ SOPN
1.0000 mg | PEN_INJECTOR | SUBCUTANEOUS | 0 refills | Status: DC
  Filled 2023-07-30: qty 3, 28d supply, fill #0

## 2023-07-30 NOTE — Progress Notes (Signed)
 S:     No chief complaint on file.  33 y.o. male who presents for diabetes evaluation, education, and management in the context of the LIBERATE Study. This is his 3 month follow-up LIBERATE visit.   PMH is significant for T2DM, HTN, CKD IV, obesity, recurrent GU infections.   Patient was referred and last seen by Primary Care Provider, Dr. Adan Holms on 06/22/2023. Patient was enrolled in the LIBERATE study on 04/08/2023. Last visit with pharmacist 05/11/2023 where patient reported restarting Ozempic  1 mg but endorsed fatigue and appetite suppression after the dose. Patient reported hypoglycemia with symptoms of loopiness, tiredness, and fatigue. Jardiance  25 mg PO was held due to recurrent yeast balantitis.   Visit with PCP on 06/01/2023: patient presented with lower extremity edema and reported nonadherence to Ozempic  due to GI side effects. He was initiated on furosemide  20 mg once daily and adjusted Ozempic  to 0.5 mg weekly.  PCP visit on: 06/22/2023 patient reported improvement to pedal edema since starting furosemide  and was told by nephrology to restart Jardiance . We told him to hold off on this due to side effects. Patient was increased to Ozempic  1 mg weekly. Of note, Dr. Newlin is considering Ethelene Herald instead of Jardiance  for renal protection.  Patient reports Diabetes was diagnosed in 2017. Today, A1c was 7.4% (down from 9.7%). Patient reports adherence to Ozempic  0.5 mg weekly injection. He reports his last injection was this past Saturday. He denies abdominal pain, nausea, and vomiting. He reports adherence to pioglitazone  30 mg daily.   Patient denies hypoglycemia. Patient reports using CGM to monitor BG but has been out of sensors since April. Patient reports not checking BG at home.   Patient reported weighing himself this morning with weight of 225 lbs.   Patient denies nocturia.  Patient denies neuropathy.  Patient denies vision changes.  Patient reports self foot exams.    Family/Social History:  Family History  Problem Relation Age of Onset   Diabetes Mother    Diabetes Father    Current diabetes medications include: Ozempic  1 mg subcutaneous weekly (reports he is taking Ozempic  0.5 mg weekly injections and his last dose was on Saturday) and pioglitazone  30 mg PO daily   Insurance coverage: Medicaid  Patient reported dietary habits: does not report changes today. Eats 2 meals/day. No bread, carb-balance tortilla wraps. Mainly eating protein and vegetables (green beans, broccoli) Drinks: water  (at least 64 oz per day), flavored-water , drinks with 2-10g carbohydrates  Patient-reported exercise habits: does not report changes today. Working out 30 min 3x per week, has a treadmill and is doing Emergency planning/management officer  O:  Date of Download: 03/12/2023-06/09/2023 % Time CGM is active: 69% Average Glucose: 147 mg/dL Glucose Management Indicator: 6.8  Glucose Variability: 25.5% (goal <36%) Time in Goal:  - Time in range 70-180: 86% - Time above range: 14% - Time below range: 0% Observed patterns:   Lab Results  Component Value Date   HGBA1C 7.4 (A) 07/30/2023    There were no vitals filed for this visit.      Latest Ref Rng & Units 06/22/2023    3:44 PM 04/06/2023    2:58 PM 03/10/2023    3:48 PM  BMP  Glucose 70 - 99 mg/dL 409  811  914   BUN 6 - 20 mg/dL 58  49  41   Creatinine 0.76 - 1.27 mg/dL 7.82  9.56  2.13   BUN/Creat Ratio 9 - 20 18  14  12    Sodium  134 - 144 mmol/L 138  142  139   Potassium 3.5 - 5.2 mmol/L 4.4  4.4  5.9   Chloride 96 - 106 mmol/L 106  103  102   CO2 20 - 29 mmol/L 18  19  23    Calcium  8.7 - 10.2 mg/dL 9.4  9.8  62.9    UACR 03/10/23:  2358 mg/g (previous 2556 mg/g)  Lipid Panel     Component Value Date/Time   CHOL 257 (H) 03/10/2023 1548   TRIG 199 (H) 03/10/2023 1548   HDL 57 03/10/2023 1548   CHOLHDL 3.6 06/19/2021 1201   LDLCALC 164 (H) 03/10/2023 1548    Clinical Atherosclerotic Cardiovascular Disease  (ASCVD): No  The ASCVD Risk score (Arnett DK, et al., 2019) failed to calculate for the following reasons:   The 2019 ASCVD risk score is only valid for ages 68 to 17   Patient is participating in a Managed Medicaid Plan:  No  A/P: LIBERATE Study:  - 6 sensors provided for a 3 month supply. Educated to contact the office if the sensor falls off early and replacements are needed before their next [employer].   Diabetes longstanding, currently controlled with today's A1c of 7.4% down from 9.7%. Much improved from last A1c of 9.7% with initiation of Freestyle Libre sensor and improved medication adherence. He appears to be adherent to his anti-diabetic regimen, reporting that he has been taking Ozempic  0.5 mg weekly and pioglitazone  30 mg daily. Jardiance  is not an option as of right now as patient is not amendable to this medication. Optimize Ozempic  dose to 1 mg for improved glycemic control. He is not symptomatic and understands management of hypoglycemic events. Emphasized importance of adherence to medications -Increase Ozempic  to 1 mg weekly injections on Wednesdays x4 weeks. Then, increase to 2 mg weekly thereafter.  -Continue pioglitazone  30 mg PO daily -Provided education on the importance of monitoring BG with FL3+.  -Extensively discussed pathophysiology of diabetes, adverse outcomes associated with T2DM, recommended lifestyle interventions, dietary effects on blood sugar control. Encouraged continued physical activity and improvement in diet. -Counseled on s/sx of and management of hypoglycemia.  -Next A1c (and 6 mo LIBERATE visit) anticipated August 2025.    Written patient instructions provided. Patient verbalized understanding of treatment plan.  Total time in face to face counseling 30 minutes.    Follow-up:  Pharmacist 08/31/2023 PCP 09/21/2023  Georges Kings M.S. PharmD Candidate Class of 2026 [employer]   [name], PharmD, [place], CPP Clinical  Pharmacist [facility] [phone]

## 2023-08-05 ENCOUNTER — Other Ambulatory Visit: Payer: Self-pay

## 2023-08-31 ENCOUNTER — Ambulatory Visit: Payer: Self-pay | Admitting: Pharmacist

## 2023-08-31 ENCOUNTER — Other Ambulatory Visit: Payer: Self-pay | Admitting: Family Medicine

## 2023-09-01 ENCOUNTER — Other Ambulatory Visit: Payer: Self-pay

## 2023-09-01 MED ORDER — SILDENAFIL CITRATE 100 MG PO TABS
100.0000 mg | ORAL_TABLET | Freq: Every day | ORAL | 1 refills | Status: DC | PRN
  Filled 2023-09-01: qty 10, 10d supply, fill #0
  Filled 2023-09-25: qty 10, 10d supply, fill #1

## 2023-09-02 ENCOUNTER — Other Ambulatory Visit: Payer: Self-pay

## 2023-09-09 ENCOUNTER — Other Ambulatory Visit: Payer: Self-pay

## 2023-09-14 ENCOUNTER — Other Ambulatory Visit: Payer: Self-pay

## 2023-09-18 ENCOUNTER — Telehealth: Payer: Self-pay | Admitting: Family Medicine

## 2023-09-18 NOTE — Telephone Encounter (Signed)
 Contacted pt left vm to confirmed appt

## 2023-09-21 ENCOUNTER — Ambulatory Visit: Admitting: Family Medicine

## 2023-09-21 ENCOUNTER — Telehealth: Payer: Self-pay | Admitting: Family Medicine

## 2023-09-21 NOTE — Telephone Encounter (Signed)
 LVM for patient to return phone call.

## 2023-09-21 NOTE — Telephone Encounter (Signed)
 Copied from CRM [id number]. Topic: Clinical - Medication Question >> Sep 21, 2023  1:43 PM Sophia H wrote:  Reason for CRM: patient states he does not have any way to check his glucose, doesn't have the sensors that he was given by Herlene. Please advise # [phone]

## 2023-09-25 ENCOUNTER — Other Ambulatory Visit: Payer: Self-pay | Admitting: Family Medicine

## 2023-09-25 ENCOUNTER — Other Ambulatory Visit: Payer: Self-pay

## 2023-09-25 MED ORDER — OZEMPIC (1 MG/DOSE) 4 MG/3ML ~~LOC~~ SOPN
1.0000 mg | PEN_INJECTOR | SUBCUTANEOUS | 0 refills | Status: DC
  Filled 2023-09-25: qty 3, 28d supply, fill #0

## 2023-09-25 MED ORDER — FUROSEMIDE 20 MG PO TABS
20.0000 mg | ORAL_TABLET | Freq: Every day | ORAL | 1 refills | Status: DC
  Filled 2023-09-25: qty 90, 90d supply, fill #0

## 2023-09-25 MED ORDER — PIOGLITAZONE HCL 30 MG PO TABS
30.0000 mg | ORAL_TABLET | Freq: Every day | ORAL | 1 refills | Status: AC
  Filled 2023-09-25 – 2023-10-22 (×2): qty 90, 90d supply, fill #0
  Filled 2024-03-03: qty 90, 90d supply, fill #1

## 2023-09-25 MED ORDER — CHLORTHALIDONE 25 MG PO TABS
25.0000 mg | ORAL_TABLET | Freq: Every day | ORAL | 1 refills | Status: AC
  Filled 2023-09-25 – 2023-12-03 (×2): qty 90, 90d supply, fill #0
  Filled 2024-03-03: qty 90, 90d supply, fill #1

## 2023-09-25 NOTE — Telephone Encounter (Signed)
 Copied from CRM [id number]. Topic: Clinical - Medication Refill >> Sep 25, 2023 12:25 PM Donee H wrote: Medication: furosemide  (LASIX ) 20 MG tablet  pioglitazone  (ACTOS ) 30 MG tablet chlorthalidone  (HYGROTON ) 25 MG tablet   Has the patient contacted their pharmacy? No, patient states was sent to the wrong pharmacy   This is the patient's preferred pharmacy:    [facility] [address], [address] [place] [address] Phone: [phone] Fax: [phone]  Is this the correct pharmacy for this prescription? No   Has the prescription been filled recently? No  Is the patient out of the medication? Yes for the pioglitazone  (ACTOS ) 30 MG tablet  Has the patient been seen for an appointment in the last year OR does the patient have an upcoming appointment? Yes  Can we respond through MyChart? Yes  Agent: Please be advised that Rx refills may take up to 3 business days. We ask that you follow-up with your pharmacy.

## 2023-09-26 ENCOUNTER — Other Ambulatory Visit: Payer: Self-pay

## 2023-09-28 ENCOUNTER — Other Ambulatory Visit: Payer: Self-pay

## 2023-09-28 NOTE — Telephone Encounter (Signed)
 Requested Prescriptions  Refused Prescriptions Disp Refills   furosemide  (LASIX ) 20 MG tablet 90 tablet 1    Sig: Take 1 tablet (20 mg total) by mouth daily.     Cardiovascular:  Diuretics - Loop Failed - 09/28/2023 11:26 AM      Failed - Cr in normal range and within 180 days    Creat  Date Value Ref Range Status  03/28/2015 1.10 0.60 - 1.35 mg/dL Final   Creatinine, Ser  Date Value Ref Range Status  06/22/2023 3.28 (H) 0.76 - 1.27 mg/dL Final   Creatinine, POC  Date Value Ref Range Status  05/15/2023 175.8 mg/dL Final    Comment:    Abstracted by HIM         Failed - Mg Level in normal range and within 180 days    No results found for: MG       Passed - K in normal range and within 180 days    Potassium  Date Value Ref Range Status  06/22/2023 4.4 3.5 - 5.2 mmol/L Final         Passed - Ca in normal range and within 180 days    Calcium   Date Value Ref Range Status  06/22/2023 9.4 8.7 - 10.2 mg/dL Final         Passed - Na in normal range and within 180 days    Sodium  Date Value Ref Range Status  06/22/2023 138 134 - 144 mmol/L Final         Passed - Cl in normal range and within 180 days    Chloride  Date Value Ref Range Status  06/22/2023 106 96 - 106 mmol/L Final         Passed - Last BP in normal range    BP Readings from Last 1 Encounters:  06/22/23 132/85         Passed - Valid encounter within last 6 months    Recent Outpatient Visits           2 months ago Type 2 diabetes mellitus with hyperglycemia, without long-term current use of insulin  (HCC)   [place] [facility] Of [place]. [facility] Fleeta Morris, Garnette CROME, RPH-CPP   3 months ago Balanitis   [place] [facility] [place] - [facility] Dept Of [place]. [facility] Delbert Clam, MD   3 months ago Pedal edema   [place] [facility] [place] - [facility] Dept Of [place]. [facility] Delbert Clam, MD   4 months ago Type 2 diabetes mellitus with  hyperglycemia, without long-term current use of insulin  [facility])   [place] [facility] Dept Of [place]. [facility] Fleeta Morris, [place] L, RPH-CPP   5 months ago Type 2 diabetes mellitus with hyperglycemia, without long-term current use of insulin  [facility])   [place] [facility] - [facility] Dept Of [place]. [facility] Fleeta Morris, Stephen L, RPH-CPP               pioglitazone  (ACTOS ) 30 MG tablet 90 tablet 1    Sig: Take 1 tablet (30 mg total) by mouth daily.     Endocrinology:  Diabetes - Glitazones - pioglitazone  Passed - 09/28/2023 11:26 AM      Passed - HBA1C is between 0 and 7.9 and within 180 days    HbA1c, POC (controlled diabetic range)  Date Value Ref Range Status  07/30/2023  7.4 (A) 0.0 - 7.0 % Final         Passed - Valid encounter within last 6 months    Recent Outpatient Visits           2 months ago Type 2 diabetes mellitus with hyperglycemia, without long-term current use of insulin  [facility])   [place] [facility] - [facility] Dept Of [place]. [facility] Fleeta Morris, Garnette CROME, RPH-CPP   3 months ago Balanitis   [place] [facility] [place] - A Dept Of [place]. [facility] Delbert Clam, MD   3 months ago Pedal edema   [place] [facility] [place] - A Dept Of [place]. [facility] Delbert Clam, MD   4 months ago Type 2 diabetes mellitus with hyperglycemia, without long-term current use of insulin  [facility])   [place] [facility] Dept Of [place]. [facility] Fleeta Morris, [place] L, RPH-CPP   5 months ago Type 2 diabetes mellitus with hyperglycemia, without long-term current use of insulin  [facility])   [place] [facility] - A Dept Of [place]. [facility] Fleeta Morris, Stephen L, RPH-CPP               chlorthalidone  (HYGROTON ) 25 MG tablet 90 tablet 1    Sig: Take 1 tablet (25 mg total) by mouth daily.     Cardiovascular: Diuretics - Thiazide  Failed - 09/28/2023 11:26 AM      Failed - Cr in normal range and within 180 days    Creat  Date Value Ref Range Status  03/28/2015 1.10 0.60 - 1.35 mg/dL Final   Creatinine, Ser  Date Value Ref Range Status  06/22/2023 3.28 (H) 0.76 - 1.27 mg/dL Final   Creatinine, POC  Date Value Ref Range Status  05/15/2023 175.8 mg/dL Final    Comment:    Abstracted by HIM         Passed - K in normal range and within 180 days    Potassium  Date Value Ref Range Status  06/22/2023 4.4 3.5 - 5.2 mmol/L Final         Passed - Na in normal range and within 180 days    Sodium  Date Value Ref Range Status  06/22/2023 138 134 - 144 mmol/L Final         Passed - Last BP in normal range    BP Readings from Last 1 Encounters:  06/22/23 132/85         Passed - Valid encounter within last 6 months    Recent Outpatient Visits           2 months ago Type 2 diabetes mellitus with hyperglycemia, without long-term current use of insulin  (HCC)   [place] [facility] Dept Of [place]. [facility] Fleeta Morris, Garnette CROME, RPH-CPP   3 months ago Balanitis    [facility] [place] - [facility] Dept Of [place]. [facility] Delbert Clam, MD   3 months ago Pedal edema    [facility] [place] - A Dept Of [place]. [facility] Delbert Clam, MD   4 months ago Type 2 diabetes mellitus with hyperglycemia, without long-term current use of insulin  [facility])    [facility] Dept [facility] [place]. [facility] Fleeta Morris, [place] L, RPH-CPP   5 months ago Type 2 diabetes mellitus with hyperglycemia,  without long-term current use of insulin  [facility])   [place] [facility] - A Dept Of [place]. [facility] Fleeta Tonia Garnette LITTIE, RPH-CPP

## 2023-10-02 ENCOUNTER — Other Ambulatory Visit: Payer: Self-pay

## 2023-10-05 ENCOUNTER — Telehealth: Payer: Self-pay | Admitting: Family Medicine

## 2023-10-05 NOTE — Telephone Encounter (Signed)
 Called to confirm appt 8/5 LVM

## 2023-10-06 ENCOUNTER — Other Ambulatory Visit: Payer: Self-pay

## 2023-10-06 ENCOUNTER — Ambulatory Visit: Admitting: Pharmacist

## 2023-10-07 ENCOUNTER — Other Ambulatory Visit: Payer: Self-pay

## 2023-10-08 ENCOUNTER — Ambulatory Visit: Admitting: Family Medicine

## 2023-10-13 ENCOUNTER — Other Ambulatory Visit: Payer: Self-pay | Admitting: Family Medicine

## 2023-10-14 ENCOUNTER — Other Ambulatory Visit: Payer: Self-pay

## 2023-10-14 MED ORDER — SEMAGLUTIDE (2 MG/DOSE) 8 MG/3ML ~~LOC~~ SOPN
2.0000 mg | PEN_INJECTOR | SUBCUTANEOUS | 0 refills | Status: DC
  Filled 2023-10-14 – 2023-10-22 (×3): qty 3, 28d supply, fill #0

## 2023-10-22 ENCOUNTER — Other Ambulatory Visit: Payer: Self-pay | Admitting: Family Medicine

## 2023-10-22 ENCOUNTER — Other Ambulatory Visit: Payer: Self-pay

## 2023-10-23 ENCOUNTER — Other Ambulatory Visit: Payer: Self-pay

## 2023-10-23 MED ORDER — SILDENAFIL CITRATE 100 MG PO TABS
100.0000 mg | ORAL_TABLET | Freq: Every day | ORAL | 1 refills | Status: DC | PRN
  Filled 2023-10-23: qty 10, 10d supply, fill #0
  Filled 2023-11-16: qty 10, 10d supply, fill #1

## 2023-10-27 ENCOUNTER — Telehealth: Payer: Self-pay | Admitting: Family Medicine

## 2023-10-27 NOTE — Telephone Encounter (Signed)
 Called patient, no answer. Left voicemail confirming upcoming appointment on 10/30/2023. Provided callback number for any questions or changes.

## 2023-10-30 ENCOUNTER — Ambulatory Visit: Attending: Family Medicine | Admitting: Pharmacist

## 2023-10-30 ENCOUNTER — Other Ambulatory Visit: Payer: Self-pay

## 2023-10-30 ENCOUNTER — Encounter: Payer: Self-pay | Admitting: Pharmacist

## 2023-10-30 DIAGNOSIS — Z7984 Long term (current) use of oral hypoglycemic drugs: Secondary | ICD-10-CM

## 2023-10-30 DIAGNOSIS — Z7985 Long-term (current) use of injectable non-insulin antidiabetic drugs: Secondary | ICD-10-CM

## 2023-10-30 DIAGNOSIS — E1165 Type 2 diabetes mellitus with hyperglycemia: Secondary | ICD-10-CM

## 2023-10-30 LAB — POCT GLYCOSYLATED HEMOGLOBIN (HGB A1C): HbA1c, POC (controlled diabetic range): 6.3 % (ref 0.0–7.0)

## 2023-10-30 MED ORDER — SEMAGLUTIDE (2 MG/DOSE) 8 MG/3ML ~~LOC~~ SOPN
2.0000 mg | PEN_INJECTOR | SUBCUTANEOUS | 3 refills | Status: AC
  Filled 2023-10-30 – 2023-12-03 (×2): qty 3, 28d supply, fill #0
  Filled 2024-01-07: qty 3, 28d supply, fill #1
  Filled 2024-02-01: qty 3, 28d supply, fill #2
  Filled 2024-03-03: qty 3, 28d supply, fill #3

## 2023-10-30 NOTE — Progress Notes (Signed)
 S:     No chief complaint on file.  33 y.o. male who presents for diabetes evaluation, education, and management in the context of the LIBERATE Study. Today, patient arrives in good spirits and presents without any assistance. I enrolled him in LIBERATE in Feb of this year. A1c at the time of enrollment was 9.7%. At his 31-month follow-up in May, A1c improved to 7.4%.   Patient was referred and last seen by Primary Care Provider, Dr. Delbert, on 06/22/23. At his last visit with me in May, we instructed him to increase Ozempic  to 1 mg weekly. Additionally, we told him to increase further to 2 mg weekly after four injections of the 1 mg dose as tolerable.   PMH is significant for PMH is significant for T2DM, HTN, CKD IV, obesity, recurrent GU infections.    Patient reports diabetes was diagnosed in 2017. Today, A1c is 6.3% (down from 7.4% from 3 months prior). Patient reports adherence to Ozempic  and pioglitazone . He denies abdominal pain, nausea, and vomiting.    Family/Social History:  Fhx: DM Tobacco: former smoker (quit in 2022) Alcohol: none reported   Current diabetes medications include: Ozempic  2 mg weekly, pioglitazone  30 mg daily   Patient reports adherence to taking all medications as prescribed.   Insurance coverage: [place] Medicaid  Patient denies hypoglycemic events.  CGM Study Study visit: 6 Month Follow Up Visit  CGM Data Download date: 10/30/23, 90-day report % Time CGM Is Active: 24 % Average glucose (mg/dL): 855 mg/dL Glucose Management Indicator (%): 6.8 % Glucose Variability (%): 21.3 % Time Above Range >180 mg/dL (%): 12 % Time in Range 70-180 mg/dL (%): 88 % Time Below Range <70 mg/dL (%): 0 %  Diabetes Distress Scale Feeling like diabetes is taking up too much of my mental and physical energy every day.: A slight problem Feeling that my doctor doesn't know enough about diabetes and diabetic care. : Not a problem Feeling angry, scared, and/or depressed when  I think about living with diabetes : A moderate problem Feeling that my doctor doesn't give my clear directions on how to manage my diabetes. : Not a problem Feeling that im not testing my blood sugars frequently enough.: A slight problem Feeling that I'm often failing with my diabetes routine: A slight problem Feelling that friends and family are not supportive enough of self care efforts.: A slight problem Feeling that diabetes controls my life.: A slight problem Feeling that my doctor doesn't take my concerns seriously enough: A slight problem Not feeling confident in my day to day ability to manage diabetes: Not a problem Feeling that I will end up with serious long term complications no matter what I do.: A moderate problem Feeling that I am not sticking closely enough to a good meal plan.: A slight problem Feeling that friends or family don't appreciate how difficult living with diabetes can be. : A moderate problem Feeling overwhelmed by the demands of living with diabetes.: A slight problem Feeling that I don't have a doctor who I can see.: A moderate problem Not feeling motivated to keep up my diabetes self management.: A slight problem Feeling that friends or family don't give me the emotional support that I would like. : Not a problem DDS17 Score: 34 Emotional Burden Score: 2.4 Physician related distress score: 1.75 Regimen Related Distress score : 1.8 Interpersonal distress score: 2    Patient denies nocturia (nighttime urination).  Patient denies neuropathy (nerve pain). Patient denies visual changes.  Patient reports self foot exams.   O:  Lab Results  Component Value Date   HGBA1C 6.3 10/30/2023     There were no vitals filed for this visit.   Lipid Panel     Component Value Date/Time   CHOL 257 (H) 03/10/2023 1548   TRIG 199 (H) 03/10/2023 1548   HDL 57 03/10/2023 1548   CHOLHDL 3.6 06/19/2021 1201   LDLCALC 164 (H) 03/10/2023 1548    Clinical  Atherosclerotic Cardiovascular Disease (ASCVD): No  The ASCVD Risk score (Arnett DK, et al., 2019) failed to calculate for the following reasons:   The 2019 ASCVD risk score is only valid for ages 51 to 65   Patient is participating in a Managed Medicaid Plan: No     A/P:  LIBERATE Study:  - Discussed options for obtaining continued supply of Libre 3 sensors.   Diabetes longstanding currently at goal. Commended him for this! Patient is not symptomatic at this time but he is able to verbalize appropriate hypoglycemia management plan. Medication adherence appears reported.  -Continued current regimen. -Patient educated on purpose, proper use, and potential adverse effects of Ozempic .  -Extensively discussed pathophysiology of diabetes, recommended lifestyle interventions, dietary effects on blood sugar control.  -Counseled on s/sx of and management of hypoglycemia.  -Next A1c anticipated 10/2023.   Written patient instructions provided. Patient verbalized understanding of treatment plan.  Total time in face to face counseling 30 minutes.    Follow-up:  Pharmacist prn. PCP clinic visit: 11/11/2023.   Willie Spears, PharmD, Willie Spears, CPP Clinical Pharmacist [facility] [phone]

## 2023-11-10 ENCOUNTER — Telehealth: Payer: Self-pay | Admitting: Family Medicine

## 2023-11-10 NOTE — Telephone Encounter (Signed)
 Pt Confirmed appt 9/10

## 2023-11-11 ENCOUNTER — Ambulatory Visit: Attending: Family Medicine | Admitting: Family Medicine

## 2023-11-11 ENCOUNTER — Encounter: Payer: Self-pay | Admitting: Family Medicine

## 2023-11-11 VITALS — BP 144/92 | HR 90 | Ht 71.0 in | Wt 235.6 lb

## 2023-11-11 DIAGNOSIS — Z7985 Long-term (current) use of injectable non-insulin antidiabetic drugs: Secondary | ICD-10-CM

## 2023-11-11 DIAGNOSIS — E1121 Type 2 diabetes mellitus with diabetic nephropathy: Secondary | ICD-10-CM

## 2023-11-11 DIAGNOSIS — N184 Chronic kidney disease, stage 4 (severe): Secondary | ICD-10-CM | POA: Diagnosis not present

## 2023-11-11 DIAGNOSIS — E1122 Type 2 diabetes mellitus with diabetic chronic kidney disease: Secondary | ICD-10-CM | POA: Diagnosis not present

## 2023-11-11 DIAGNOSIS — N529 Male erectile dysfunction, unspecified: Secondary | ICD-10-CM

## 2023-11-11 DIAGNOSIS — E1169 Type 2 diabetes mellitus with other specified complication: Secondary | ICD-10-CM | POA: Diagnosis not present

## 2023-11-11 DIAGNOSIS — E785 Hyperlipidemia, unspecified: Secondary | ICD-10-CM

## 2023-11-11 DIAGNOSIS — I129 Hypertensive chronic kidney disease with stage 1 through stage 4 chronic kidney disease, or unspecified chronic kidney disease: Secondary | ICD-10-CM

## 2023-11-11 NOTE — Patient Instructions (Signed)
 VISIT SUMMARY:  Today, you came in for a follow-up visit to manage your diabetes and hypertension. We discussed your current medications, recent improvements, and some concerns you have about your health.  YOUR PLAN:  -HYPERTENSION: Hypertension, or high blood pressure, can lead to serious health problems if not managed properly. Your blood pressure was high likely because you stopped taking chlorthalidone . Please restart chlorthalidone  25 mg daily and continue taking amlodipine  10 mg daily. Also, try to follow a low sodium diet and get regular exercise.  -TYPE 2 DIABETES MELLITUS: Type 2 diabetes is a condition where your body has trouble regulating blood sugar levels. Your diabetes is well-controlled with your current medications, Actos  and Ozempic . Continue taking Actos  30 mg daily and Ozempic  0.5 mg once a week. We will also check your kidney function and cholesterol levels. Jardiance  has been added to your allergy list due to the severe rash it caused.  -CHRONIC KIDNEY DISEASE: Chronic kidney disease means your kidneys are not working as well as they should. We will monitor your kidney function with a test. If you are not happy with your current kidney specialist, you can request a different one.  -MALE ERECTILE DYSFUNCTION: Erectile dysfunction can be related to low testosterone  levels. We will check your testosterone  levels with a blood test.  INSTRUCTIONS:  Please follow up with the lab tests for kidney function, cholesterol, and testosterone  levels. If you are not satisfied with your current nephrologist, consider requesting a different one.

## 2023-11-11 NOTE — Progress Notes (Signed)
 Subjective:  Patient ID: Willie Spears, male    DOB: October 01, 1990  Age: 33 y.o. MRN: [id number]  CC: Medical Management of Chronic Issues     Discussed the use of AI scribe software for clinical note transcription with the patient, who gave verbal consent to proceed.  History of Present Illness Willie Spears is a 33 year old male with a history of hypertension, type 2 diabetes mellitus.  Stage IV CKD who presents for medication management and follow-up.  His diabetes management has improved, with HbA1c decreasing from 7.4% to 6.3%. He is on Actos  30 mg and Ozempic  2 mg. Severe balanitis from Jardiance  led to its discontinuation. Ozempic  causes unpleasant burping but he would like to remain on it for now as it has helped him with his weight loss. 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   He manages hypertension with amlodipine  10 mg but has not been taking chlorthalidone  25 mg, believing it was only for swelling, which he no longer experiences.  He also no longer needs his furosemide .  He does not regularly monitor blood pressure at home. He has not followed up with nephrology because he stated he did not like his nephrologist with [facility] due to his annual nephrologist insisting that he continue the SGLT2i even though he had adverse reactions to it.  He is concerned about low testosterone  levels, experiencing low libido and cognitive issues. He also notes low vitamin D  levels.  He has lost weight, reaching 219 pounds, but has regained some weight due to dietary changes. He is actively managing weight through walking and working on cars and motorcycles professionally.     Past Medical History:  Diagnosis Date   AKI (acute kidney injury) (HCC) 06/18/2020   Anemia    Diabetes mellitus without complication (HCC)    Hypertension    Scrotal abscess 05/01/2018    Past Surgical History:   Procedure Laterality Date   IRRIGATION AND DEBRIDEMENT ABSCESS N/A 05/02/2018   Procedure: IRRIGATION AND DEBRIDEMENT ABSCESS scrotal;  Surgeon: Devere Lonni Righter, MD;  Location: [facility];  Service: Urology;  Laterality: N/A;    Family History  Problem Relation Age of Onset   Diabetes Mother    Diabetes Father     Social History   Socioeconomic History   Marital status: Single    Spouse name: Not on file   Number of children: Not on file   Years of education: Not on file   Highest education level: Some college, no degree  Occupational History   Not on file  Tobacco Use   Smoking status: Former    Current packs/day: 0.00    Types: Cigarettes    Quit date: 06/13/2020    Years since quitting: 3.4   Smokeless tobacco: Never  Vaping Use   Vaping status: Never Used  Substance and Sexual Activity   Alcohol use: No   Drug use: Yes    Types: Marijuana   Sexual activity: Yes  Other Topics Concern   Not on file  Social History Narrative   Not on file   Social Drivers of Health   Financial Resource Strain: Medium Risk (04/06/2023)   Overall Financial Resource Strain (CARDIA)    Difficulty of Paying Living Expenses: Somewhat hard  Food Insecurity: Food Insecurity Present (04/06/2023)   Hunger Vital Sign    Worried About Running Out of Food in the Last Year: Never true  Ran Out of Food in the Last Year: Sometimes true  Transportation Needs: No Transportation Needs (04/06/2023)   PRAPARE - Administrator, Civil Service (Medical): No    Lack of Transportation (Non-Medical): No  Physical Activity: Insufficiently Active (04/06/2023)   Exercise Vital Sign    Days of Exercise per Week: 3 days    Minutes of Exercise per Session: 30 min  Stress: No Stress Concern Present (04/06/2023)   [employer] of Occupational Health - Occupational Stress Questionnaire    Feeling of Stress : Only a little  Social Connections: Moderately Integrated (04/06/2023)   Social Connection  and Isolation Panel    Frequency of Communication with Friends and Family: Three times a week    Frequency of Social Gatherings with Friends and Family: Once a week    Attends Religious Services: 1 to 4 times per year    Active Member of [employer] or Organizations: No    Attends Engineer, structural: Not on file    Marital Status: Living with partner    Allergies  Allergen Reactions   Jardiance  [Empagliflozin ] Rash    Penile candidiasis    Outpatient Medications Prior to Visit  Medication Sig Dispense Refill   amLODipine  (NORVASC ) 10 MG tablet Take 1 tablet (10 mg total) by mouth daily. 90 tablet 1   atorvastatin  (LIPITOR) 20 MG tablet Take 1 tablet (20 mg total) by mouth daily. 90 tablet 1   Blood Glucose Monitoring Suppl (TRUE METRIX METER) w/Device KIT Use to check blood sugar twice daily. 1 kit 0   Blood Pressure Monitoring (BLOOD PRESSURE KIT) DEVI Use to measure blood pressure 1 each 0   chlorthalidone  (HYGROTON ) 25 MG tablet Take 1 tablet (25 mg total) by mouth daily. 90 tablet 1   chlorthalidone  (HYGROTON ) 25 MG tablet Take 1 tablet (25 mg total) by mouth daily. 90 tablet 1   clotrimazole -betamethasone  (LOTRISONE ) cream Apply 1 Application topically daily. 30 g 0   glucose blood (TRUE METRIX BLOOD GLUCOSE TEST) test strip Use as instructed to check blood sugar twice daily. 100 each 2   methocarbamol  (ROBAXIN ) 500 MG tablet Take 1 tablet (500 mg total) by mouth 2 (two) times daily. 20 tablet 0   nystatin  cream (MYCOSTATIN ) Apply to affected area 2 times daily 30 g 0   pioglitazone  (ACTOS ) 30 MG tablet Take 1 tablet (30 mg total) by mouth daily. 90 tablet 1   pioglitazone  (ACTOS ) 30 MG tablet Take 1 tablet (30 mg total) by mouth daily. 90 tablet 1   Semaglutide , 2 MG/DOSE, 8 MG/3ML SOPN Inject 2 mg as directed once a week. 3 mL 3   sildenafil  (VIAGRA ) 100 MG tablet Take 1 tablet (100 mg total) by mouth daily as needed for erectile dysfunction. At least 24 hours between doses  10 tablet 1   traMADol  (ULTRAM ) 50 MG tablet Take 1 tablet (50 mg total) by mouth every 6 (six) hours as needed (pain). 12 tablet 0   furosemide  (LASIX ) 20 MG tablet Take 1 tablet (20 mg total) by mouth daily. 90 tablet 1   No facility-administered medications prior to visit.     ROS Review of Systems  Constitutional:  Positive for fatigue. Negative for activity change and appetite change.  HENT:  Negative for sinus pressure and sore throat.   Respiratory:  Negative for chest tightness, shortness of breath and wheezing.   Cardiovascular:  Negative for chest pain and palpitations.  Gastrointestinal:  Negative for abdominal distention, abdominal pain  and constipation.  Genitourinary: Negative.   Musculoskeletal: Negative.   Psychiatric/Behavioral:  Negative for behavioral problems and dysphoric mood.     Objective:  BP (!) 144/92   Pulse 90   Ht 5' 11 (1.803 m)   Wt 235 lb 9.6 oz (106.9 kg)   SpO2 100%   BMI 32.86 kg/m      11/11/2023   12:00 PM 11/11/2023   11:03 AM 06/22/2023    3:00 PM  BP/Weight  Systolic BP 144 161 132  Diastolic BP 92 99 85  Wt. (Lbs)  235.6   BMI  32.86 kg/m2       Physical Exam Constitutional:      Appearance: He is well-developed.  Cardiovascular:     Rate and Rhythm: Normal rate.     Heart sounds: Normal heart sounds. No murmur heard. Pulmonary:     Effort: Pulmonary effort is normal.     Breath sounds: Normal breath sounds. No wheezing or rales.  Chest:     Chest wall: No tenderness.  Abdominal:     General: Bowel sounds are normal. There is no distension.     Palpations: Abdomen is soft. There is no mass.     Tenderness: There is no abdominal tenderness.  Musculoskeletal:        General: Normal range of motion.     Right lower leg: No edema.     Left lower leg: No edema.  Neurological:     Mental Status: He is alert and oriented to person, place, and time.  Psychiatric:        Mood and Affect: Mood normal.        Latest  Ref Rng & Units 06/22/2023    3:44 PM 04/06/2023    2:58 PM 03/10/2023    3:48 PM  CMP  Glucose 70 - 99 mg/dL 845  848  803   BUN 6 - 20 mg/dL 58  49  41   Creatinine 0.76 - 1.27 mg/dL 6.71  6.50  6.68   Sodium 134 - 144 mmol/L 138  142  139   Potassium 3.5 - 5.2 mmol/L 4.4  4.4  5.9   Chloride 96 - 106 mmol/L 106  103  102   CO2 20 - 29 mmol/L 18  19  23    Calcium  8.7 - 10.2 mg/dL 9.4  9.8  89.8   Total Protein 6.0 - 8.5 g/dL 6.5   6.9   Total Bilirubin 0.0 - 1.2 mg/dL 0.3   0.2   Alkaline Phos 44 - 121 IU/L 79   92   AST 0 - 40 IU/L 21   12   ALT 0 - 44 IU/L 19   17     Lipid Panel     Component Value Date/Time   CHOL 257 (H) 03/10/2023 1548   TRIG 199 (H) 03/10/2023 1548   HDL 57 03/10/2023 1548   CHOLHDL 3.6 06/19/2021 1201   LDLCALC 164 (H) 03/10/2023 1548    CBC    Component Value Date/Time   WBC 8.1 06/19/2021 1201   WBC 7.5 06/18/2020 1056   RBC 5.98 (H) 06/19/2021 1201   RBC 5.88 (H) 06/18/2020 1056   HGB 14.5 06/19/2021 1201   HCT 45.8 06/19/2021 1201   PLT 382 06/19/2021 1201   MCV 77 (L) 06/19/2021 1201   MCH 24.2 (L) 06/19/2021 1201   MCH 23.8 (L) 06/18/2020 1056   MCHC 31.7 06/19/2021 1201   MCHC 32.4 06/18/2020 1056  RDW 13.1 06/19/2021 1201   LYMPHSABS 2.6 06/19/2021 1201   MONOABS 0.7 05/05/2018 0503   EOSABS 0.2 06/19/2021 1201   BASOSABS 0.1 06/19/2021 1201    Lab Results  Component Value Date   HGBA1C 6.3 10/30/2023    Lab Results  Component Value Date   HGBA1C 6.3 10/30/2023   HGBA1C 7.4 (A) 07/30/2023   HGBA1C 9.7 (A) 04/06/2023       Assessment & Plan Hypertension associated with stage IV CKD secondary to type 2 diabetes mellitus Elevated blood pressure likely due to non-adherence to chlorthalidone . Blood pressure improved after repeat measurement. Misunderstood chlorthalidone 's function. - Restart chlorthalidone  25 mg oral daily. - Continue amlodipine  10 mg oral daily. - Advise on low sodium diet and exercise.  Type 2  diabetes mellitus with diabetic nephropathy Diabetes well-controlled with HbA1c of 6.3. Severe rash from Jardiance , now discontinued. Actos  and Ozempic  effective for diabetes and weight management. Ozempic  causes burping due to slowed gastric emptying. Continued current treatment due to kidney considerations. - Continue Actos  30 mg oral daily. - Continue Ozempic  0.5 mg subcutaneous once a week. - Order kidney function test. - Order cholesterol test. - Add Jardiance  to allergy list due to severe rash.  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 Dissatisfaction with current nephrologist noted. - Order kidney function test. - Advise to request a different nephrologist if dissatisfied with current care.  Male erectile dysfunction Reports low sex drive, suspecting low testosterone . - Order testosterone  blood test.      No orders of the defined types were placed in this encounter.   Follow-up: Return in about 6 months (around 05/10/2024) for Chronic medical conditions.       Corrina Sabin, MD, FAAFP. [facility] [place], [place] [phone]   11/11/2023, 12:52 PM

## 2023-11-17 ENCOUNTER — Other Ambulatory Visit: Payer: Self-pay

## 2023-11-17 ENCOUNTER — Ambulatory Visit: Payer: Self-pay | Admitting: Family Medicine

## 2023-11-17 LAB — CMP14+EGFR
ALT: 14 IU/L (ref 0–44)
AST: 17 IU/L (ref 0–40)
Albumin: 3.7 g/dL — ABNORMAL LOW (ref 4.1–5.1)
Alkaline Phosphatase: 91 IU/L (ref 44–121)
BUN/Creatinine Ratio: 11 (ref 9–20)
BUN: 31 mg/dL — ABNORMAL HIGH (ref 6–20)
Bilirubin Total: 0.2 mg/dL (ref 0.0–1.2)
CO2: 19 mmol/L — ABNORMAL LOW (ref 20–29)
Calcium: 8.8 mg/dL (ref 8.7–10.2)
Chloride: 108 mmol/L — ABNORMAL HIGH (ref 96–106)
Creatinine, Ser: 2.9 mg/dL — ABNORMAL HIGH (ref 0.76–1.27)
Globulin, Total: 2.4 g/dL (ref 1.5–4.5)
Glucose: 109 mg/dL — ABNORMAL HIGH (ref 70–99)
Potassium: 5.2 mmol/L (ref 3.5–5.2)
Sodium: 141 mmol/L (ref 134–144)
Total Protein: 6.1 g/dL (ref 6.0–8.5)
eGFR: 29 mL/min/1.73 — ABNORMAL LOW (ref >59)

## 2023-11-17 LAB — TESTOSTERONE,FREE AND TOTAL
Testosterone, Free: 9.1 pg/mL (ref 8.7–25.1)
Testosterone: 485 ng/dL (ref 264–916)

## 2023-11-17 LAB — LP+NON-HDL CHOLESTEROL
Cholesterol, Total: 157 mg/dL (ref 100–199)
HDL: 62 mg/dL (ref >39)
LDL Chol Calc (NIH): 77 mg/dL (ref 0–99)
Total Non-HDL-Chol (LDL+VLDL): 95 mg/dL (ref 0–129)
Triglycerides: 96 mg/dL (ref 0–149)
VLDL Cholesterol Cal: 18 mg/dL (ref 5–40)

## 2023-12-03 ENCOUNTER — Other Ambulatory Visit: Payer: Self-pay | Admitting: Family Medicine

## 2023-12-04 ENCOUNTER — Other Ambulatory Visit: Payer: Self-pay

## 2023-12-04 MED ORDER — SILDENAFIL CITRATE 100 MG PO TABS
100.0000 mg | ORAL_TABLET | Freq: Every day | ORAL | 1 refills | Status: DC | PRN
  Filled 2023-12-04 – 2023-12-09 (×2): qty 10, 10d supply, fill #0
  Filled 2023-12-22: qty 10, 10d supply, fill #1

## 2023-12-09 ENCOUNTER — Other Ambulatory Visit: Payer: Self-pay

## 2023-12-23 ENCOUNTER — Other Ambulatory Visit: Payer: Self-pay

## 2023-12-25 ENCOUNTER — Other Ambulatory Visit: Payer: Self-pay

## 2024-01-07 ENCOUNTER — Other Ambulatory Visit: Payer: Self-pay

## 2024-01-07 ENCOUNTER — Other Ambulatory Visit: Payer: Self-pay | Admitting: Family Medicine

## 2024-01-07 MED ORDER — SILDENAFIL CITRATE 100 MG PO TABS
100.0000 mg | ORAL_TABLET | Freq: Every day | ORAL | 1 refills | Status: DC | PRN
  Filled 2024-01-07: qty 10, 30d supply, fill #0
  Filled 2024-02-01: qty 10, 30d supply, fill #1

## 2024-01-08 ENCOUNTER — Other Ambulatory Visit: Payer: Self-pay

## 2024-02-01 ENCOUNTER — Other Ambulatory Visit: Payer: Self-pay

## 2024-02-02 ENCOUNTER — Other Ambulatory Visit: Payer: Self-pay

## 2024-03-03 ENCOUNTER — Other Ambulatory Visit: Payer: Self-pay | Admitting: Family Medicine

## 2024-03-03 MED ORDER — AMLODIPINE BESYLATE 10 MG PO TABS
10.0000 mg | ORAL_TABLET | Freq: Every day | ORAL | 1 refills | Status: AC
  Filled 2024-03-03: qty 90, 90d supply, fill #0

## 2024-03-03 MED ORDER — SILDENAFIL CITRATE 100 MG PO TABS
100.0000 mg | ORAL_TABLET | Freq: Every day | ORAL | 1 refills | Status: AC | PRN
  Filled 2024-03-03: qty 10, 10d supply, fill #0
  Filled 2024-03-28: qty 10, 10d supply, fill #1

## 2024-03-04 ENCOUNTER — Other Ambulatory Visit: Payer: Self-pay

## 2024-03-22 ENCOUNTER — Ambulatory Visit: Admission: EM | Admit: 2024-03-22 | Discharge: 2024-03-22 | Disposition: A | Discharge status: Home / Self Care

## 2024-03-22 ENCOUNTER — Other Ambulatory Visit: Payer: Self-pay

## 2024-03-22 ENCOUNTER — Telehealth: Admitting: Emergency Medicine

## 2024-03-22 ENCOUNTER — Encounter: Payer: Self-pay | Admitting: Emergency Medicine

## 2024-03-22 ENCOUNTER — Telehealth: Admitting: Physician Assistant

## 2024-03-22 DIAGNOSIS — R300 Dysuria: Secondary | ICD-10-CM

## 2024-03-22 DIAGNOSIS — N342 Other urethritis: Secondary | ICD-10-CM | POA: Insufficient documentation

## 2024-03-22 LAB — POCT URINE DIPSTICK
Bilirubin, UA: NEGATIVE
Glucose, UA: NEGATIVE mg/dL
Ketones, POC UA: NEGATIVE mg/dL
Leukocytes, UA: NEGATIVE
Nitrite, UA: NEGATIVE
POC PROTEIN,UA: >=300 — AB
Spec Grav, UA: 1.02
Urobilinogen, UA: 0.2 U/dL
pH, UA: 6

## 2024-03-22 MED ORDER — PHENAZOPYRIDINE HCL 200 MG PO TABS
200.0000 mg | ORAL_TABLET | Freq: Three times a day (TID) | ORAL | 0 refills | Status: AC
  Filled 2024-03-22: qty 6, 2d supply, fill #0

## 2024-03-22 NOTE — ED Provider Notes (Signed)
 " [facility]    CSN: [id number] Arrival date & time: 03/22/24  1100      History   Chief Complaint Chief Complaint  Patient presents with   Dysuria    HPI Willie Spears is a 34 y.o. male.   Pt, with a hx of CKD and diabetes, presents today due to dysuria for the past 2 days. Pt admits to rough sex, frequent masturbation, and receiving oral sex from significant other with a peppermint in her mouth. Pt states that his symptoms have improved. Pt denies penile discharge or concern for STIs. Pt denies lower abdominal pain or back pain.   The history is provided by the patient.  Dysuria Presenting symptoms: dysuria     Past Medical History:  Diagnosis Date   AKI (acute kidney injury) 06/18/2020   Anemia    Diabetes mellitus without complication (HCC)    Hypertension    Scrotal abscess 05/01/2018    Patient Active Problem List   Diagnosis Date Noted   Hypertension associated with stage 2 chronic kidney disease due to type 2 diabetes mellitus (HCC) 10/01/2020   Obesity, Class III, BMI 40-49.9 (morbid obesity) (HCC) 10/01/2020   Microalbuminuria 07/10/2020   Type 2 diabetes mellitus (HCC) 03/29/2015   Vitamin D  deficiency 03/29/2015   Family history of diabetes mellitus 03/28/2015    Past Surgical History:  Procedure Laterality Date   IRRIGATION AND DEBRIDEMENT ABSCESS N/A 05/02/2018   Procedure: IRRIGATION AND DEBRIDEMENT ABSCESS scrotal;  Surgeon: Devere Lonni Righter, MD;  Location: [facility];  Service: Urology;  Laterality: N/A;       Home Medications    Prior to Admission medications  Medication Sig Start Date End Date Taking? Authorizing Provider  phenazopyridine  (PYRIDIUM ) 200 MG tablet Take 1 tablet (200 mg total) by mouth 3 (three) times daily. 03/22/24  Yes Andra Krabbe C, PA-C  amLODipine  (NORVASC ) 10 MG tablet Take 1 tablet (10 mg total) by mouth daily. 03/03/24   Newlin, Enobong, MD  atorvastatin  (LIPITOR) 20 MG tablet Take 1 tablet (20  mg total) by mouth daily. 03/11/23   Newlin, Enobong, MD  Blood Glucose Monitoring Suppl (TRUE METRIX METER) w/Device KIT Use to check blood sugar twice daily. 06/19/21   Brien Belvie FORBES, MD  Blood Pressure Monitoring (BLOOD PRESSURE KIT) DEVI Use to measure blood pressure 11/21/21   Brien Belvie FORBES, MD  chlorthalidone  (HYGROTON ) 25 MG tablet Take 1 tablet (25 mg total) by mouth daily. 06/16/23   Newlin, Enobong, MD  chlorthalidone  (HYGROTON ) 25 MG tablet Take 1 tablet (25 mg total) by mouth daily. 09/25/23   Newlin, Enobong, MD  clotrimazole -betamethasone  (LOTRISONE ) cream Apply 1 Application topically daily. 02/16/23   Vivienne Delon HERO, PA-C  glucose blood (TRUE METRIX BLOOD GLUCOSE TEST) test strip Use as instructed to check blood sugar twice daily. 06/19/21   Brien Belvie FORBES, MD  methocarbamol  (ROBAXIN ) 500 MG tablet Take 1 tablet (500 mg total) by mouth 2 (two) times daily. 04/17/22   Trine Raynell Moder, MD  nystatin  cream (MYCOSTATIN ) Apply to affected area 2 times daily 04/14/23   Billy Asberry FALCON, PA-C  pioglitazone  (ACTOS ) 30 MG tablet Take 1 tablet (30 mg total) by mouth daily. 06/16/23   Newlin, Enobong, MD  pioglitazone  (ACTOS ) 30 MG tablet Take 1 tablet (30 mg total) by mouth daily. 09/25/23   Newlin, Enobong, MD  Semaglutide , 2 MG/DOSE, 8 MG/3ML SOPN Inject 2 mg as directed once a week. 10/30/23   Newlin, Enobong, MD  sildenafil  (  VIAGRA ) 100 MG tablet Take 1 tablet (100 mg total) by mouth daily as needed for erectile dysfunction. At least 24 hours between doses 03/03/24   Newlin, Enobong, MD  traMADol  (ULTRAM ) 50 MG tablet Take 1 tablet (50 mg total) by mouth every 6 (six) hours as needed (pain). 07/14/22   Vonna Sharlet POUR, MD  hydrochlorothiazide  (HYDRODIURIL ) 50 MG tablet Take 1 tablet (50 mg total) by mouth daily. Patient not taking: Reported on 03/18/2019 05/06/18 03/18/19  Jillian Buttery, MD  insulin  aspart (NOVOLOG  FLEXPEN) 100 UNIT/ML FlexPen Inject 5 Units into the skin 3 (three)  times daily with meals for 30 days. Patient not taking: Reported on 03/18/2019 05/05/18 03/18/19  Jillian Buttery, MD  insulin  regular (NOVOLIN R) 100 units/mL injection Inject 0.1 mLs (10 Units total) into the skin 3 (three) times daily before meals. 12/30/19 01/13/20  Alec House, MD    Family History Family History  Problem Relation Age of Onset   Diabetes Mother    Diabetes Father     Social History Social History[1]   Allergies   Jardiance  [empagliflozin ]   Review of Systems Review of Systems  Genitourinary:  Positive for dysuria.     Physical Exam Triage Vital Signs ED Triage Vitals  Encounter Vitals Group     BP 03/22/24 1112 (!) 188/126     Girls Systolic BP Percentile --      Girls Diastolic BP Percentile --      Boys Systolic BP Percentile --      Boys Diastolic BP Percentile --      Pulse Rate 03/22/24 1112 91     Resp 03/22/24 1112 18     Temp 03/22/24 1112 97.9 F (36.6 C)     Temp Source 03/22/24 1112 Oral     SpO2 03/22/24 1112 98 %     Weight --      Height --      Head Circumference --      Peak Flow --      Pain Score 03/22/24 1113 0     Pain Loc --      Pain Education --      Exclude from Growth Chart --    No data found.  Updated Vital Signs BP (!) 188/126 (BP Location: Left Arm)   Pulse 91   Temp 97.9 F (36.6 C) (Oral)   Resp 18   SpO2 98%   Visual Acuity Right Eye Distance:   Left Eye Distance:   Bilateral Distance:    Right Eye Near:   Left Eye Near:    Bilateral Near:     Physical Exam Vitals and nursing note reviewed.  Constitutional:      General: He is not in acute distress.    Appearance: Normal appearance. He is not ill-appearing, toxic-appearing or diaphoretic.  Eyes:     General: No scleral icterus. Cardiovascular:     Rate and Rhythm: Normal rate and regular rhythm.     Heart sounds: Normal heart sounds.  Pulmonary:     Effort: Pulmonary effort is normal. No respiratory distress.     Breath sounds: Normal  breath sounds. No wheezing or rhonchi.  Abdominal:     General: Abdomen is flat. Bowel sounds are normal.     Palpations: Abdomen is soft.     Tenderness: There is no abdominal tenderness. There is no right CVA tenderness or left CVA tenderness.  Skin:    General: Skin is warm.  Neurological:  Mental Status: He is alert and oriented to person, place, and time.  Psychiatric:        Mood and Affect: Mood normal.        Behavior: Behavior normal.      UC Treatments / Results  Labs (all labs ordered are listed, but only abnormal results are displayed) Labs Reviewed  POCT URINE DIPSTICK - Abnormal; Notable for the following components:      Result Value   Blood, UA small (*)    POC PROTEIN,UA >=300 (*)    All other components within normal limits  URINE CULTURE    EKG   Radiology No results found.  Procedures Procedures (including critical care time)  Medications Ordered in UC Medications - No data to display  Initial Impression / Assessment and Plan / UC Course  I have reviewed the triage vital signs and the nursing notes.  Pertinent labs & imaging results that were available during my care of the patient were reviewed by me and considered in my medical decision making (see chart for details).     Final Clinical Impressions(s) / UC Diagnoses   Final diagnoses:  Urethritis     Discharge Instructions      Diagnosed with inflammation of urethra, you are not concerned about STIs so we will not test for them today. I will send you a medicine that will coat your urethra and bladder and make your urine orange. This will help with pain and discomfort. We will send urine for culture to check for bacteria. I recommend no sex or masturbation for 1 week or until results return.     ED Prescriptions     Medication Sig Dispense Auth. Provider   phenazopyridine  (PYRIDIUM ) 200 MG tablet Take 1 tablet (200 mg total) by mouth 3 (three) times daily. 6 tablet Andra Corean BROCKS, PA-C      PDMP not reviewed this encounter.    [1]  Social History Tobacco Use   Smoking status: Former    Current packs/day: 0.00    Types: Cigarettes    Quit date: 06/13/2020    Years since quitting: 3.7   Smokeless tobacco: Never  Vaping Use   Vaping status: Never Used  Substance Use Topics   Alcohol use: No   Drug use: Yes    Types: Marijuana     Andra Corean BROCKS, PA-C 03/22/24 1149  "

## 2024-03-22 NOTE — Progress Notes (Signed)
" °  Because you are male and have diabetes, I feel you need urine testing to make sure you get the right treatment and I recommend that you be seen in a face-to-face visit. I am sorry we cannot treat you by virtual care - neither evisit or video visit.    NOTE: There will be NO CHARGE for this E-Visit   If you are having a true medical emergency, please call 911.     For an urgent face to face visit, [facility] has multiple urgent care centers for your convenience.  Click the link below for the full list of locations and hours, walk-in wait times, appointment scheduling options and driving directions:  Urgent Care - [place], [place], [place], [place], [place], [place]  [place]     Your MyChart E-visit questionnaire answers were reviewed by a board certified advanced clinical practitioner to complete your personal care plan based on your specific symptoms.    Thank you for using e-Visits.    "

## 2024-03-22 NOTE — Discharge Instructions (Addendum)
 Diagnosed with inflammation of urethra, you are not concerned about STIs so we will not test for them today. I will send you a medicine that will coat your urethra and bladder and make your urine orange. This will help with pain and discomfort. We will send urine for culture to check for bacteria. I recommend no sex or masturbation for 1 week or until results return.

## 2024-03-22 NOTE — ED Triage Notes (Signed)
 Pt here to check and see if he has a UTI; pt sts some discomfort after masturbation 2 days ago; poor historian when describing sx

## 2024-03-22 NOTE — Progress Notes (Signed)
 Possible UTI symptoms, improving some, but still present. Will seek in person evaluation for testing as he does also have CKD with T2DM. He was very pleasant and agreeable for evaluation in person.   NO CHARGE.

## 2024-03-23 ENCOUNTER — Ambulatory Visit (HOSPITAL_COMMUNITY): Payer: Self-pay

## 2024-03-23 LAB — URINE CULTURE: Culture: NO GROWTH

## 2024-03-29 ENCOUNTER — Other Ambulatory Visit: Payer: Self-pay

## 2024-05-10 ENCOUNTER — Ambulatory Visit: Admitting: Family Medicine
